# Patient Record
Sex: Male | Born: 1970 | Race: White | Hispanic: No | Smoking: Never smoker
Health system: Southern US, Community
[De-identification: ages and names within clinical notes are randomized; demographics above are authoritative.]

## PROBLEM LIST (undated history)

## (undated) DIAGNOSIS — I639 Cerebral infarction, unspecified: Secondary | ICD-10-CM

## (undated) DIAGNOSIS — I10 Essential (primary) hypertension: Secondary | ICD-10-CM

## (undated) DIAGNOSIS — E785 Hyperlipidemia, unspecified: Secondary | ICD-10-CM

---

## 2003-07-16 DIAGNOSIS — I639 Cerebral infarction, unspecified: Secondary | ICD-10-CM

## 2003-07-16 HISTORY — DX: Cerebral infarction, unspecified: I63.9

## 2021-01-12 HISTORY — PX: SPINAL FIXATION SURGERY: SHX1055

## 2021-04-18 ENCOUNTER — Encounter: Payer: Self-pay | Admitting: Physical Therapy

## 2021-04-23 ENCOUNTER — Other Ambulatory Visit: Payer: Self-pay | Admitting: Physical Medicine and Rehabilitation

## 2021-04-23 ENCOUNTER — Other Ambulatory Visit: Payer: Self-pay

## 2021-04-23 ENCOUNTER — Encounter (HOSPITAL_COMMUNITY): Payer: Self-pay | Admitting: Physical Medicine and Rehabilitation

## 2021-04-23 ENCOUNTER — Inpatient Hospital Stay (HOSPITAL_COMMUNITY)
Admission: RE | Admit: 2021-04-23 | Discharge: 2021-05-25 | DRG: 945 | Disposition: A | Discharge status: Home Health | Payer: 59 | Source: Other Acute Inpatient Hospital | Attending: Physical Medicine and Rehabilitation | Admitting: Physical Medicine and Rehabilitation

## 2021-04-23 ENCOUNTER — Inpatient Hospital Stay (HOSPITAL_COMMUNITY): Payer: 59

## 2021-04-23 DIAGNOSIS — S14104S Unspecified injury at C4 level of cervical spinal cord, sequela: Secondary | ICD-10-CM | POA: Diagnosis not present

## 2021-04-23 DIAGNOSIS — J9611 Chronic respiratory failure with hypoxia: Secondary | ICD-10-CM | POA: Diagnosis present

## 2021-04-23 DIAGNOSIS — S14105D Unspecified injury at C5 level of cervical spinal cord, subsequent encounter: Principal | ICD-10-CM

## 2021-04-23 DIAGNOSIS — Z09 Encounter for follow-up examination after completed treatment for conditions other than malignant neoplasm: Secondary | ICD-10-CM

## 2021-04-23 DIAGNOSIS — Z79899 Other long term (current) drug therapy: Secondary | ICD-10-CM | POA: Diagnosis not present

## 2021-04-23 DIAGNOSIS — Z7902 Long term (current) use of antithrombotics/antiplatelets: Secondary | ICD-10-CM | POA: Diagnosis not present

## 2021-04-23 DIAGNOSIS — Z931 Gastrostomy status: Secondary | ICD-10-CM

## 2021-04-23 DIAGNOSIS — N319 Neuromuscular dysfunction of bladder, unspecified: Secondary | ICD-10-CM | POA: Diagnosis present

## 2021-04-23 DIAGNOSIS — L89152 Pressure ulcer of sacral region, stage 2: Secondary | ICD-10-CM | POA: Diagnosis present

## 2021-04-23 DIAGNOSIS — Z930 Tracheostomy status: Secondary | ICD-10-CM | POA: Diagnosis not present

## 2021-04-23 DIAGNOSIS — L8990 Pressure ulcer of unspecified site, unspecified stage: Secondary | ICD-10-CM | POA: Insufficient documentation

## 2021-04-23 DIAGNOSIS — S14114D Complete lesion at C4 level of cervical spinal cord, subsequent encounter: Principal | ICD-10-CM

## 2021-04-23 DIAGNOSIS — Z419 Encounter for procedure for purposes other than remedying health state, unspecified: Secondary | ICD-10-CM

## 2021-04-23 DIAGNOSIS — Y95 Nosocomial condition: Secondary | ICD-10-CM | POA: Diagnosis present

## 2021-04-23 DIAGNOSIS — R1310 Dysphagia, unspecified: Secondary | ICD-10-CM | POA: Diagnosis present

## 2021-04-23 DIAGNOSIS — E46 Unspecified protein-calorie malnutrition: Secondary | ICD-10-CM | POA: Diagnosis present

## 2021-04-23 DIAGNOSIS — I69351 Hemiplegia and hemiparesis following cerebral infarction affecting right dominant side: Secondary | ICD-10-CM

## 2021-04-23 DIAGNOSIS — Z809 Family history of malignant neoplasm, unspecified: Secondary | ICD-10-CM

## 2021-04-23 DIAGNOSIS — G629 Polyneuropathy, unspecified: Secondary | ICD-10-CM

## 2021-04-23 DIAGNOSIS — M24521 Contracture, right elbow: Secondary | ICD-10-CM | POA: Diagnosis present

## 2021-04-23 DIAGNOSIS — M24529 Contracture, unspecified elbow: Secondary | ICD-10-CM

## 2021-04-23 DIAGNOSIS — S12400D Unspecified displaced fracture of fifth cervical vertebra, subsequent encounter for fracture with routine healing: Secondary | ICD-10-CM | POA: Diagnosis not present

## 2021-04-23 DIAGNOSIS — R059 Cough, unspecified: Secondary | ICD-10-CM

## 2021-04-23 DIAGNOSIS — M24522 Contracture, left elbow: Secondary | ICD-10-CM | POA: Diagnosis present

## 2021-04-23 DIAGNOSIS — M62422 Contracture of muscle, left upper arm: Secondary | ICD-10-CM | POA: Diagnosis present

## 2021-04-23 DIAGNOSIS — G894 Chronic pain syndrome: Secondary | ICD-10-CM | POA: Diagnosis present

## 2021-04-23 DIAGNOSIS — K592 Neurogenic bowel, not elsewhere classified: Secondary | ICD-10-CM | POA: Diagnosis present

## 2021-04-23 DIAGNOSIS — J156 Pneumonia due to other aerobic Gram-negative bacteria: Secondary | ICD-10-CM | POA: Diagnosis present

## 2021-04-23 DIAGNOSIS — G8250 Quadriplegia, unspecified: Secondary | ICD-10-CM | POA: Diagnosis present

## 2021-04-23 DIAGNOSIS — M62421 Contracture of muscle, right upper arm: Secondary | ICD-10-CM | POA: Diagnosis present

## 2021-04-23 DIAGNOSIS — M62838 Other muscle spasm: Secondary | ICD-10-CM | POA: Diagnosis present

## 2021-04-23 DIAGNOSIS — Z6827 Body mass index (BMI) 27.0-27.9, adult: Secondary | ICD-10-CM

## 2021-04-23 DIAGNOSIS — Z430 Encounter for attention to tracheostomy: Secondary | ICD-10-CM

## 2021-04-23 DIAGNOSIS — R609 Edema, unspecified: Secondary | ICD-10-CM | POA: Diagnosis not present

## 2021-04-23 DIAGNOSIS — D72829 Elevated white blood cell count, unspecified: Secondary | ICD-10-CM

## 2021-04-23 DIAGNOSIS — I10 Essential (primary) hypertension: Secondary | ICD-10-CM | POA: Diagnosis present

## 2021-04-23 DIAGNOSIS — L89150 Pressure ulcer of sacral region, unstageable: Secondary | ICD-10-CM | POA: Diagnosis present

## 2021-04-23 DIAGNOSIS — E8809 Other disorders of plasma-protein metabolism, not elsewhere classified: Secondary | ICD-10-CM | POA: Diagnosis not present

## 2021-04-23 DIAGNOSIS — L89320 Pressure ulcer of left buttock, unstageable: Secondary | ICD-10-CM | POA: Diagnosis present

## 2021-04-23 DIAGNOSIS — E876 Hypokalemia: Secondary | ICD-10-CM | POA: Diagnosis not present

## 2021-04-23 DIAGNOSIS — D62 Acute posthemorrhagic anemia: Secondary | ICD-10-CM | POA: Diagnosis present

## 2021-04-23 DIAGNOSIS — J189 Pneumonia, unspecified organism: Secondary | ICD-10-CM | POA: Diagnosis not present

## 2021-04-23 DIAGNOSIS — Z8249 Family history of ischemic heart disease and other diseases of the circulatory system: Secondary | ICD-10-CM | POA: Diagnosis not present

## 2021-04-23 DIAGNOSIS — R338 Other retention of urine: Secondary | ICD-10-CM | POA: Diagnosis present

## 2021-04-23 DIAGNOSIS — G4700 Insomnia, unspecified: Secondary | ICD-10-CM | POA: Diagnosis not present

## 2021-04-23 HISTORY — DX: Cerebral infarction, unspecified: I63.9

## 2021-04-23 MED ORDER — BACLOFEN 10 MG PO TABS
40.0000 mg | ORAL_TABLET | Freq: Three times a day (TID) | ORAL | Status: DC
  Administered 2021-04-23 – 2021-05-25 (×94): 40 mg via ORAL
  Filled 2021-04-23 (×96): qty 4

## 2021-04-23 MED ORDER — PSEUDOEPHEDRINE HCL 30 MG PO TABS
15.0000 mg | ORAL_TABLET | Freq: Two times a day (BID) | ORAL | Status: DC
  Administered 2021-04-23 – 2021-05-25 (×64): 15 mg via ORAL
  Filled 2021-04-23 (×65): qty 1

## 2021-04-23 MED ORDER — BISACODYL 10 MG RE SUPP
10.0000 mg | Freq: Every day | RECTAL | Status: DC
  Administered 2021-04-23 – 2021-05-24 (×29): 10 mg via RECTAL
  Filled 2021-04-23 (×33): qty 1

## 2021-04-23 MED ORDER — POLYETHYLENE GLYCOL 3350 17 G PO PACK: 17.0000 g | PACK | Freq: Every day | ORAL | Status: DC | PRN

## 2021-04-23 MED ORDER — ALUM & MAG HYDROXIDE-SIMETH 200-200-20 MG/5ML PO SUSP
30.0000 mL | ORAL | Status: DC | PRN
Start: 2021-04-23 — End: 2021-05-25

## 2021-04-23 MED ORDER — LISINOPRIL 5 MG PO TABS
5.0000 mg | ORAL_TABLET | Freq: Every day | ORAL | Status: DC
  Administered 2021-04-24 – 2021-05-25 (×28): 5 mg via ORAL
  Filled 2021-04-23 (×31): qty 1

## 2021-04-23 MED ORDER — BISACODYL 10 MG RE SUPP: 10.0000 mg | Freq: Every day | RECTAL | Status: DC | PRN

## 2021-04-23 MED ORDER — FLEET ENEMA 7-19 GM/118ML RE ENEM: 1.0000 | ENEMA | Freq: Once | RECTAL | Status: DC | PRN

## 2021-04-23 MED ORDER — ATORVASTATIN CALCIUM 40 MG PO TABS: 40.0000 mg | ORAL_TABLET | Freq: Every day | ORAL | Status: DC

## 2021-04-23 MED ORDER — BACLOFEN 1 MG/ML ORAL SUSPENSION: 40.0000 mg | Freq: Three times a day (TID) | ORAL | Status: DC

## 2021-04-23 MED ORDER — ATORVASTATIN CALCIUM 40 MG PO TABS
40.0000 mg | ORAL_TABLET | Freq: Every day | ORAL | Status: DC
  Administered 2021-04-23 – 2021-05-24 (×32): 40 mg via ORAL
  Filled 2021-04-23 (×33): qty 1

## 2021-04-23 MED ORDER — DIPHENHYDRAMINE HCL 12.5 MG/5ML PO ELIX: 12.5000 mg | ORAL_SOLUTION | Freq: Four times a day (QID) | ORAL | Status: DC | PRN

## 2021-04-23 MED ORDER — SENNOSIDES-DOCUSATE SODIUM 8.6-50 MG PO TABS
2.0000 | ORAL_TABLET | Freq: Every day | ORAL | Status: DC
  Administered 2021-04-24 – 2021-04-26 (×3): 2 via ORAL
  Filled 2021-04-23 (×3): qty 2

## 2021-04-23 MED ORDER — PSEUDOEPHEDRINE HCL 15 MG/5ML PO LIQD: 15.0000 mg | Freq: Two times a day (BID) | ORAL | Status: DC

## 2021-04-23 MED ORDER — ENOXAPARIN SODIUM 30 MG/0.3ML IJ SOSY
30.0000 mg | PREFILLED_SYRINGE | Freq: Two times a day (BID) | INTRAMUSCULAR | Status: AC
Start: 2021-04-23 — End: 2021-05-06
  Administered 2021-04-23 – 2021-05-06 (×27): 30 mg via SUBCUTANEOUS
  Filled 2021-04-23 (×27): qty 0.3

## 2021-04-23 MED ORDER — LISINOPRIL 5 MG PO TABS: 5.0000 mg | ORAL_TABLET | Freq: Every day | ORAL | Status: DC

## 2021-04-23 MED ORDER — MAGIC MOUTHWASH W/LIDOCAINE: 5.0000 mL | Freq: Four times a day (QID) | ORAL | Status: DC | PRN

## 2021-04-23 MED ORDER — ZINC SULFATE 220 (50 ZN) MG PO CAPS
220.0000 mg | ORAL_CAPSULE | Freq: Every day | ORAL | Status: DC
  Administered 2021-04-23 – 2021-05-25 (×32): 220 mg via ORAL
  Filled 2021-04-23 (×32): qty 1

## 2021-04-23 MED ORDER — TRAZODONE HCL 50 MG PO TABS: 75.0000 mg | ORAL_TABLET | Freq: Every day | ORAL | Status: DC

## 2021-04-23 MED ORDER — MELATONIN 3 MG PO TABS
6.0000 mg | ORAL_TABLET | Freq: Every day | ORAL | Status: DC
  Administered 2021-04-23 – 2021-05-24 (×31): 6 mg
  Filled 2021-04-23 (×31): qty 2

## 2021-04-23 MED ORDER — MUSCLE RUB 10-15 % EX CREA
TOPICAL_CREAM | CUTANEOUS | Status: DC | PRN
  Filled 2021-04-23: qty 85

## 2021-04-23 MED ORDER — PROCHLORPERAZINE MALEATE 5 MG PO TABS: 5.0000 mg | ORAL_TABLET | Freq: Four times a day (QID) | ORAL | Status: DC | PRN

## 2021-04-23 MED ORDER — SENNOSIDES 8.8 MG/5ML PO SYRP: 10.0000 mL | ORAL_SOLUTION | Freq: Every day | ORAL | Status: DC

## 2021-04-23 MED ORDER — PROCHLORPERAZINE 25 MG RE SUPP: 12.5000 mg | Freq: Four times a day (QID) | RECTAL | Status: DC | PRN

## 2021-04-23 MED ORDER — OXYCODONE HCL 5 MG PO TABS: 5.0000 mg | ORAL_TABLET | ORAL | Status: DC | PRN

## 2021-04-23 MED ORDER — TRAZODONE HCL 50 MG PO TABS
75.0000 mg | ORAL_TABLET | Freq: Every day | ORAL | Status: DC
  Administered 2021-04-23 – 2021-04-29 (×7): 75 mg via ORAL
  Filled 2021-04-23 (×7): qty 2

## 2021-04-23 MED ORDER — LIDOCAINE 5 % EX PTCH
1.0000 | MEDICATED_PATCH | CUTANEOUS | Status: DC
  Administered 2021-04-23 – 2021-05-24 (×32): 1 via TRANSDERMAL
  Filled 2021-04-23 (×32): qty 1

## 2021-04-23 MED ORDER — POLYETHYLENE GLYCOL 3350 17 G PO PACK
17.0000 g | PACK | Freq: Every day | ORAL | Status: DC
  Administered 2021-04-24 – 2021-04-25 (×2): 17 g via ORAL
  Filled 2021-04-23 (×2): qty 1

## 2021-04-23 MED ORDER — ASCORBIC ACID 500 MG PO TABS
500.0000 mg | ORAL_TABLET | Freq: Two times a day (BID) | ORAL | Status: DC
  Administered 2021-04-23 – 2021-05-25 (×64): 500 mg via ORAL
  Filled 2021-04-23 (×64): qty 1

## 2021-04-23 MED ORDER — ALBUTEROL SULFATE (2.5 MG/3ML) 0.083% IN NEBU
2.5000 mg | INHALATION_SOLUTION | RESPIRATORY_TRACT | Status: DC | PRN
  Administered 2021-04-30 – 2021-05-02 (×6): 2.5 mg via RESPIRATORY_TRACT
  Filled 2021-04-23 (×6): qty 3

## 2021-04-23 MED ORDER — TRAZODONE HCL 50 MG PO TABS: 25.0000 mg | ORAL_TABLET | Freq: Every evening | ORAL | Status: DC | PRN

## 2021-04-23 MED ORDER — POLYETHYLENE GLYCOL 3350 17 G PO PACK: 17.0000 g | PACK | Freq: Every day | ORAL | Status: DC

## 2021-04-23 MED ORDER — JUVEN PO PACK
1.0000 | PACK | Freq: Two times a day (BID) | ORAL | Status: DC
  Administered 2021-04-24 – 2021-05-10 (×4): 1 via ORAL
  Filled 2021-04-23 (×27): qty 1

## 2021-04-23 MED ORDER — LIDOCAINE HCL URETHRAL/MUCOSAL 2 % EX GEL: CUTANEOUS | Status: DC | PRN

## 2021-04-23 MED ORDER — POLYVINYL ALCOHOL 1.4 % OP SOLN
1.0000 [drp] | OPHTHALMIC | Status: DC | PRN
  Administered 2021-04-29 – 2021-05-16 (×2): 1 [drp] via OPHTHALMIC
  Filled 2021-04-23 (×2): qty 15

## 2021-04-23 MED ORDER — PROCHLORPERAZINE EDISYLATE 10 MG/2ML IJ SOLN: 5.0000 mg | Freq: Four times a day (QID) | INTRAMUSCULAR | Status: DC | PRN

## 2021-04-23 MED ORDER — TIZANIDINE HCL 2 MG PO TABS
2.0000 mg | ORAL_TABLET | Freq: Two times a day (BID) | ORAL | Status: DC
  Administered 2021-04-23 – 2021-05-25 (×64): 2 mg via ORAL
  Filled 2021-04-23 (×63): qty 1

## 2021-04-23 MED ORDER — GUAIFENESIN-DM 100-10 MG/5ML PO SYRP
5.0000 mL | ORAL_SOLUTION | Freq: Four times a day (QID) | ORAL | Status: DC | PRN
  Filled 2021-04-23: qty 10

## 2021-04-23 MED ORDER — COLLAGENASE 250 UNIT/GM EX OINT
TOPICAL_OINTMENT | Freq: Every day | CUTANEOUS | Status: DC
  Filled 2021-04-23: qty 30

## 2021-04-23 MED ORDER — GABAPENTIN 300 MG PO CAPS
900.0000 mg | ORAL_CAPSULE | Freq: Three times a day (TID) | ORAL | Status: DC
  Administered 2021-04-23 – 2021-05-25 (×95): 900 mg via ORAL
  Filled 2021-04-23 (×94): qty 3

## 2021-04-23 MED ORDER — OXYCODONE HCL 5 MG PO TABS
5.0000 mg | ORAL_TABLET | ORAL | Status: DC | PRN
  Administered 2021-04-23 – 2021-05-02 (×6): 5 mg via ORAL
  Filled 2021-04-23 (×7): qty 1

## 2021-04-23 MED ORDER — GABAPENTIN 250 MG/5ML PO SOLN: 900.0000 mg | Freq: Three times a day (TID) | ORAL | Status: DC

## 2021-04-23 MED ORDER — ACETAMINOPHEN 325 MG PO TABS
325.0000 mg | ORAL_TABLET | ORAL | Status: DC | PRN
  Administered 2021-05-04 – 2021-05-18 (×7): 650 mg via ORAL
  Filled 2021-04-23 (×8): qty 2
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
## 2021-04-23 NOTE — Progress Notes (Signed)
Patient via ambulance by EMS. Alert and oriented x 4. Quadriplegic. Bilateral Lower extremity 3+ edema. Tingling sensation to upper arms.  Awake, Trache patent and intact, arrived with a #6 cuffed [place].  Stage IV pressure ulcer to right buttock and stage II pressure ulcer to ischium.  Measurements documented  and dressing changed.  Will continue to monitor.

## 2021-04-23 NOTE — Progress Notes (Signed)
INPATIENT REHABILITATION ADMISSION NOTE   Arrival Method: EMS      Mental Orientation: A&O x4   Assessment: see flowsheet    Skin: Stage 2 and unstageable to buttocks    IV'S: n/a   Pain: none reported   Tubes and Drains: n/a   Safety Measures: soft touch call bell     Vital Signs: see flowsheet   Height and Weight: see flowsheet   Rehab Orientation: oriented to rehab, safety, bowel/bladder, medications   Family: arriving later    Notes: Patient ID: Luke Wright, male   DOB: 03-24-1971, 50 y.o.   MRN: [id number]  Met with patient upon arrival to CIR. Introduced myself and explained my role in his care. Assisted with skin check, trach care, and suction. PA and MD explained medications, rehab process, and what to expect while on CIR. Soft touch call bell in place, air mattress ordered, and all questions answered. RT has been in to assess trach and ordered necessary supplies for patient. Will continue to monitor progress.  Dorthula Nettles, RN3, BSN, CBIS, [place], [facility], Inpatient Rehabilitation Office [phone] Cell [phone]

## 2021-04-23 NOTE — Progress Notes (Signed)
Inpatient Rehabilitation Medication Review by a Pharmacist  A complete drug regimen review was completed for this patient to identify any potential clinically significant medication issues.  High Risk Drug Classes Is patient taking? Indication by Medication  Antipsychotic Yes Prochlorperazine: PRN nausea   Anticoagulant Yes Enoxaparin: DVT ppx  Antibiotic No   Opioid Yes Oxycodone: PRN pain  Antiplatelet No   Hypoglycemics/insulin No   Vasoactive Medication No   Chemotherapy No   Other Yes Acetaminophen: PRN pain Albuterol: PRN SOB Maalox: PRN indigestion Atorvastatin: HLD/CVA ppx Baclofen: muscle spasms Bisacodyl: PRN constipation Diphenhydramine: PRN itching Robitussin DM: PRN cough Lidocaine patch: pain Melatonin: insomnia PEG: constipation Pseudoephedrine: secretions, bradycardia Senokot-S: constipation Fleet enema: constipation  Tizanidine: muscle spasms Trazodone: PRN sleep       Type of Medication Issue Identified Description of Issue Recommendation(s)  Drug Interaction(s) (clinically significant)     Duplicate Therapy     Allergy     No Medication Administration End Date     Incorrect Dose     Additional Drug Therapy Needed     Significant med changes from prior encounter (inform family/care partners about these prior to discharge).    Other       Clinically significant medication issues were identified that warrant physician communication and completion of prescribed/recommended actions by midnight of the next day:  No  Pharmacist comments:  - medications reordered from [facility] IP medication list   Time spent performing this drug regimen review (minutes):  15 minutes   Thank you for allowing pharmacy to be a part of this patient's care.  Thelma Barge, PharmD Clinical Pharmacist
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
## 2021-04-24 ENCOUNTER — Encounter (HOSPITAL_COMMUNITY): Payer: 59

## 2021-04-24 DIAGNOSIS — J189 Pneumonia, unspecified organism: Secondary | ICD-10-CM

## 2021-04-24 LAB — CBC WITH DIFFERENTIAL/PLATELET
Abs Immature Granulocytes: 0.03 10*3/uL (ref 0.00–0.07)
Basophils Absolute: 0.1 10*3/uL (ref 0.0–0.1)
Basophils Relative: 1 %
Eosinophils Absolute: 0.3 10*3/uL (ref 0.0–0.5)
Eosinophils Relative: 3 %
HCT: 30.5 % — ABNORMAL LOW (ref 39.0–52.0)
Hemoglobin: 9.8 g/dL — ABNORMAL LOW (ref 13.0–17.0)
Immature Granulocytes: 0 %
Lymphocytes Relative: 35 %
Lymphs Abs: 2.8 10*3/uL (ref 0.7–4.0)
MCH: 27.2 pg (ref 26.0–34.0)
MCHC: 32.1 g/dL (ref 30.0–36.0)
MCV: 84.7 fL (ref 80.0–100.0)
Monocytes Absolute: 1 10*3/uL (ref 0.1–1.0)
Monocytes Relative: 12 %
Neutro Abs: 4 10*3/uL (ref 1.7–7.7)
Neutrophils Relative %: 49 %
Platelets: 274 10*3/uL (ref 150–400)
RBC: 3.6 MIL/uL — ABNORMAL LOW (ref 4.22–5.81)
RDW: 16.3 % — ABNORMAL HIGH (ref 11.5–15.5)
WBC: 8.2 10*3/uL (ref 4.0–10.5)
nRBC: 0 % (ref 0.0–0.2)

## 2021-04-24 LAB — COMPREHENSIVE METABOLIC PANEL
ALT: 10 U/L (ref 0–44)
AST: 12 U/L — ABNORMAL LOW (ref 15–41)
Albumin: 2.6 g/dL — ABNORMAL LOW (ref 3.5–5.0)
Alkaline Phosphatase: 85 U/L (ref 38–126)
Anion gap: 7 (ref 5–15)
BUN: 9 mg/dL (ref 6–20)
CO2: 27 mmol/L (ref 22–32)
Calcium: 8.8 mg/dL — ABNORMAL LOW (ref 8.9–10.3)
Chloride: 102 mmol/L (ref 98–111)
Creatinine, Ser: 0.61 mg/dL (ref 0.61–1.24)
GFR, Estimated: >60 mL/min (ref >60)
Glucose, Bld: 112 mg/dL — ABNORMAL HIGH (ref 70–99)
Potassium: 3.7 mmol/L (ref 3.5–5.1)
Sodium: 136 mmol/L (ref 135–145)
Total Bilirubin: 0.4 mg/dL (ref 0.3–1.2)
Total Protein: 5.9 g/dL — ABNORMAL LOW (ref 6.5–8.1)

## 2021-04-24 MED ORDER — SODIUM CHLORIDE 3 % IN NEBU
4.0000 mL | INHALATION_SOLUTION | Freq: Four times a day (QID) | RESPIRATORY_TRACT | Status: DC | PRN
  Administered 2021-04-24: 4 mL via RESPIRATORY_TRACT
  Filled 2021-04-24 (×2): qty 4

## 2021-04-24 MED ORDER — SODIUM CHLORIDE 0.9 % IV SOLN
2.0000 g | Freq: Three times a day (TID) | INTRAVENOUS | Status: DC
  Administered 2021-04-24 – 2021-04-26 (×6): 2 g via INTRAVENOUS
  Filled 2021-04-24 (×7): qty 2

## 2021-04-24 NOTE — Patient Care Conference (Signed)
Inpatient RehabilitationTeam Conference and Plan of Care Update Date: 04/24/2021   Time: 11:24 AM    Patient Name: Luke Wright      Medical Record Number: [id number]  Date of Birth: 08/28/1970 Sex: Male         Room/Bed: 4W20C/4W20C-01 Payor Info: Payor: Advertising copywriter MEDICARE / Plan: Suan Halter DUAL COMPLETE / Product Type: *No Product type* /    Admit Date/Time:  04/23/2021  2:17 PM  Primary Diagnosis:  Quadriplegia, unspecified [facility])  Hospital Problems: Principal Problem:   Quadriplegia, unspecified (HCC) Active Problems:   Pressure injury of skin   Pressure injury of sacral region, unstageable [facility])    Expected Discharge Date: Expected Discharge Date:  (4 Weeks)  Team Members Present: Physician leading conference: Dr. Genice Rouge Social Worker Present: [name], LCSWA Nurse Present: Kennyth Arnold, RN PT Present: Peter Congo, PT OT Present: Ardis Rowan, COTA;Jennifer Katrinka Blazing, OT SLP Present: Feliberto Gottron, SLP PPS Coordinator present : Fae Pippin, SLP     Current Status/Progress Goal Weekly Team Focus  Bowel/Bladder   incont times two. patient requiring I&O cath  family members will learn how to cath patient  assess q shift and prn   Swallow/Nutrition/ Hydration   Eval Pending         ADL's   total/dependent bathing, dressing. total A for feeding/oral hygiene, etc. Has movement at shoulders, bicep, and trace in R wrist. Very painful UEs. R elbow contracture > L elbow contracture.  Max/total bathe/dress, Mod self feed/oral hygiene with AE  bed mobility, AE for self feeding/oral hygiene, directing care, core strength/sitting balance   Mobility   PT Eval Pending  PT Eval Pending  PT Eval Pending   Communication   Eval Pending         Safety/Cognition/ Behavioral Observations  Eval Pending         Pain   0 out of 10  remain pain free  assess q shift and prn   Skin   2 wounds on bottom, foam dressing on them. trach  have patient  rotate sides every 2 hours  assess q 2 hours     Discharge Planning:  To be assessed.   Team Discussion: Wrist extension, bilateral elbow contractures. Copious amounts of thick secretions from trach. To do Botox on arms Thursday. Beginning bowel/bladder program. No reported pain, but severe spasticity in arms/legs. Unstageable to left buttock with stage 2 next to it. Consulting WOC concerning Hydrotherapy. Lots of tone, has splints for elbows. Needs to be put on schedule for Dr. Kieth Brightly. Will need Baclofen pump post discharge. Trach now at 35% FIO2. Soft touch call bell place at head.  Patient on target to meet rehab goals: Total assist +2 for rolling. Doesn't tolerate EOB very long. OT and SLP eval pending.  *See Care Plan and progress notes for long and short-term goals.   Revisions to Treatment Plan:  Adjusting medications, consulting WOC, monitoring daily labs.  Teaching Needs: Family education, medication management, pain, tone, spasticity management, trach care, skin/wound care, bowel/bladder management, transfer training, balance training, endurance training, W/C training, safety awareness.  Current Barriers to Discharge: Inaccessible home environment, Decreased caregiver support, Medical stability, Home enviroment access/layout, Trach, Incontinence, Neurogenic bowel and bladder, Wound care, Lack of/limited family support, Weight, Weight bearing restrictions, Medication compliance, Behavior, Nutritional means, and New oxygen  Possible Resolutions to Barriers: Family education with daughters, continue current medications, monitor lab work, monitor depression.     Medical Summary Current Status: developing contractures in B/L  elbows- on bowel program; severe spasticity in arms/legs- needs Botox and ITB pump eventually;; unstageable to L buttock and stage II next to it;  copious thick secretions with trach- no PMV; hypotension;  Barriers to Discharge: Behavior;Decreased  family/caregiver support;Home enviroment access/layout;Incontinence;Wound care;New oxygen;Trach;Weight;Medical stability;Neurogenic Bowel & Bladder;Other (comments)  Barriers to Discharge Comments: SW- to be assessed; severe spasticity- needs Botox Possible Resolutions to [employer] Focus: BP good with TEDs; sats dropped to low 80s with PT; OT not eval'd yey; SLP this afternoon- D/c ~ 4 weeks   Continued Need for Acute Rehabilitation Level of Care: The patient requires daily medical management by a physician with specialized training in physical medicine and rehabilitation for the following reasons: Direction of a multidisciplinary physical rehabilitation program to maximize functional independence : Yes Medical management of patient stability for increased activity during participation in an intensive rehabilitation regime.: Yes Analysis of laboratory values and/or radiology reports with any subsequent need for medication adjustment and/or medical intervention. : Yes   I attest that I was present, lead the team conference, and concur with the assessment and plan of the team.   Tennis Must 04/24/2021, 6:05 PM
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
## 2021-04-24 NOTE — Plan of Care (Signed)
  Problem: RH Balance Goal: LTG: Patient will maintain dynamic sitting balance (OT) Description: LTG:  Patient will maintain dynamic sitting balance with assistance during activities of daily living (OT) Flowsheets (Taken 04/24/2021 1711) LTG: Pt will maintain dynamic sitting balance during ADLs with: Moderate Assistance - Patient 50 - 74%   Problem: RH Eating Goal: LTG Patient will perform eating w/assist, cues/equip (OT) Description: LTG: Patient will perform eating with assist, with/without cues using equipment (OT) Flowsheets (Taken 04/24/2021 1711) LTG: Pt will perform eating with assistance level of: Moderate Assistance - Patient 50 - 74%   Problem: RH Grooming Goal: LTG Patient will perform grooming w/assist,cues/equip (OT) Description: LTG: Patient will perform grooming with assist, with/without cues using equipment (OT) Flowsheets (Taken 04/24/2021 1711) LTG: Pt will perform grooming with assistance level of: Maximal Assistance - Patient 25 - 49%   Problem: RH Bathing Goal: LTG Patient will bathe all body parts with assist levels (OT) Description: LTG: Patient will bathe all body parts with assist levels (OT) Flowsheets (Taken 04/24/2021 1711) LTG: Pt will perform bathing with assistance level/cueing: Maximal Assistance - Patient 25 - 49%   Problem: RH Dressing Goal: LTG Patient will perform upper body dressing (OT) Description: LTG Patient will perform upper body dressing with assist, with/without cues (OT). Flowsheets (Taken 04/24/2021 1711) LTG: Pt will perform upper body dressing with assistance level of: Maximal Assistance - Patient 25 - 49% Goal: LTG Patient will perform lower body dressing w/assist (OT) Description: LTG: Patient will perform lower body dressing with assist, with/without cues in positioning using equipment (OT) Flowsheets (Taken 04/24/2021 1711) LTG: Pt will perform lower body dressing with assistance level of: Maximal Assistance - Patient 25 - 49%    Problem: RH Functional Use of Upper Extremity Goal: LTG Patient will use RT/LT upper extremity as a (OT) Description: LTG: Patient will use right/left upper extremity as a stabilizer/gross assist/diminished/nondominant/dominant level with assist, with/without cues during functional activity (OT) Flowsheets (Taken 04/24/2021 1711) LTG: Use of upper extremity in functional activities: RUE as dominant level LTG: Pt will use upper extremity in functional activity with assistance level of: Moderate Assistance - Patient 50 - 74%   Problem: RH Pre-functional/Other (Specify) Goal: RH LTG OT (Specify) 1 Description: RH LTG OT (Specify) 1 Flowsheets (Taken 04/24/2021 1711) LTG: Other OT (Specify) 1: Pt will direct caregivers in bowel/bladder program as well as hygiene and clothing management with Independence
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
## 2021-04-24 NOTE — Plan of Care (Signed)
  Problem: RH Wheelchair Mobility Goal: LTG Patient will propel w/c in home environment (PT) Description: LTG: Patient will propel wheelchair in home environment, # of feet with assistance (PT). Flowsheets (Taken 04/24/2021 1329) LTG: Pt will propel w/c in home environ  assist needed:: Independent with assistive device LTG: Propel w/c distance in home environment: 75 ft

## 2021-04-24 NOTE — Progress Notes (Signed)
Pharmacy Antibiotic Note  Luke Wright is a 50 y.o. male admitted on 04/23/2021 with suspected pneumonia.  Patient is reported to have copious green secretions out of his tracheostomy tube Chest x-ray independently reviewed shows left lower lobe consolidation. Pharmacy has been consulted for cefepime dosing.  Plan: Cefepime 2g IV q8h  F/U tracheal aspirate cultures  Height: 5' 10.87" (180 cm) Weight: 111 kg (244 lb 11.4 oz) IBW/kg (Calculated) : 75  Temp (24hrs), Avg:98 F (36.7 C), Min:97.8 F (36.6 C), Max:98.2 F (36.8 C)  Recent Labs  Lab 04/24/21 0515  WBC 8.2  CREATININE 0.61    Estimated Creatinine Clearance: 139.7 mL/min (by C-G formula based on SCr of 0.61 mg/dL).    No Known Allergies  Antimicrobials this admission: none  Microbiology results: 10/11 trach aspirate cx: pending   Thank you for allowing pharmacy to be a part of this patient's care.  Thelma Barge, PharmD Clinical Pharmacist

## 2021-04-24 NOTE — Progress Notes (Signed)
Patient appears stable this shift.  Started on IV ABT with no adverse reaction noted.  Copious green color secretions noted from trach with multiple deep suctioning.  No SOB noted.

## 2021-04-24 NOTE — Progress Notes (Signed)
Inpatient Rehabilitation  Patient information reviewed and entered into eRehab system by Satish Hammers Chelcee Korpi, OTR/L.   Information including medical coding, functional ability and quality indicators will be reviewed and updated through discharge.    

## 2021-04-24 NOTE — Progress Notes (Addendum)
Patient ID: Luke Wright, male   DOB: 08-13-1970, 50 y.o.   MRN: [id number]  SW met with pt, pt dtr, and her boyfriend's father Lyman Speller to provide updates from team conference, and inform d/c dtr pending at this time. Dtr [name] reports she will be his primary caregiver since she is a stay at home mother. Reports he will not be returning back to his home, d/c to daughter's home: [address] [place], [place] [address]. SW will continue to provide updates and assess pt for d/c needs.   Loralee Pacas, MSW, [place] Office: [phone] Cell: [phone] Fax: [phone]
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
## 2021-04-24 NOTE — Progress Notes (Signed)
Physical Therapy Session Note  Patient Details  Name: Luke Wright MRN: [id number] Date of Birth: 08/04/1970  Today's Date: 04/24/2021 PT Individual Time: 1630-1700 PT Individual Time Calculation (min): 30 min   Short Term Goals: Week 1:  PT Short Term Goal 1 (Week 1): Pt will tolerate sitting up in chair x 1 hour PT Short Term Goal 2 (Week 1): Pt will initiate power wheelchair mobility PT Short Term Goal 3 (Week 1): Pt will recall pressure relief schedule and pressure relief techniques  Skilled Therapeutic Interventions/Progress Updates:    Pt received seated in bed, agreeable to PT session. No complaints of pain. Pt utilized PSMV this session with no decrease in SpO2, on 7.5L O2 via trach collar and SpO2 remains at 90% and higher. Pt reports pain in UE when rolling with nursing due to arm pressing against bedrail, requesting bedrails be lowered during bed mobility. Attempted rolling with bedrails down, pt unsafe to perform this without bedrails at this time. Placed pillow between UE and bedrail and pt has improved comfort with rolling. Pt is total A x 2 for rolling R/L, able to minimally pull with therapist UE looped through his UE. Placed sign behind pt's bed to remind staff to place pillow between UE and bedrail before rolling. Discussed pressure relief in bed and importance of time spent in SL vs supine due to sacral and buttocks wounds. Pt agreeable to remain in R sidelying at end of session. Also educated pt that next session will focus on transfers to [facility] chair with eventual progression to power wheelchair, pt excited for power wheelchair. Pt left in R sidelying in bed with soft touch call button next to his head in pillow, bed alarm in place.  Therapy Documentation Precautions:  Precautions Precautions: Fall Precaution Comments: C5 SCI, [place] A, trach Restrictions Weight Bearing Restrictions: No    Therapy/Group: Individual Therapy   Peter Congo, PT, DPT,  CSRS  04/24/2021, 5:46 PM
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
## 2021-04-25 ENCOUNTER — Inpatient Hospital Stay (HOSPITAL_COMMUNITY): Payer: 59

## 2021-04-25 DIAGNOSIS — R609 Edema, unspecified: Secondary | ICD-10-CM

## 2021-04-25 MED ORDER — DAKINS (1/4 STRENGTH) 0.125 % EX SOLN
Freq: Every day | CUTANEOUS | Status: AC
  Filled 2021-04-25: qty 473

## 2021-04-25 MED ORDER — ONABOTULINUMTOXINA 100 UNITS IJ SOLR
400.0000 [IU] | Freq: Once | INTRAMUSCULAR | Status: DC
  Filled 2021-04-25: qty 400

## 2021-04-25 NOTE — Progress Notes (Signed)
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 He has never used smokeless tobacco. He reports that he does not drink alcohol and does not use drugs.  Family / Support Systems Marital Status: Divorced How Long?: 2005 Patient Roles: Parent Spouse/Significant Other: N/Luke Children: adult dtr- Luke Wright Other Supports: none Anticipated Caregiver: dtr Luke Wright Ability/Limitations of Caregiver: Luke Wright is Luke stay at home mother and cares for 6 children under 83 yrs old. Caregiver Availability: Intermittent Family Dynamics: Pt was living alone, and now will d/c to his daughter's home.  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 Education: high school grad Health Literacy - How often do you need to have someone help you when you read instructions, pamphlets, or other written material from your doctor or pharmacy?: Never Writes: Yes Employment Status: Disabled Date Retired/Disabled/Unemployed: SSDI began in 2012 Legal History/Current Legal Issues: [place] in 2006. No current issues. Guardian/Conservator: N/Luke   Abuse/Neglect Abuse/Neglect Assessment Can Be Completed: Yes Physical Abuse: Denies Verbal Abuse: Denies Sexual Abuse: Denies Exploitation of patient/patient's resources: Denies Self-Neglect:  Denies  Patient response to: Social Isolation - How often do you feel lonely or isolated from those around you?: Never  Emotional Status Pt's affect, behavior and adjustment status: Pt in good spirits at time of visit. Recent Psychosocial Issues: Denies Psychiatric History: Denies Substance Abuse History: Denies  Patient / Family Perceptions, Expectations & Goals Pt/Family understanding of illness & functional limitations: Pt and family have Luke general understanding of care needs. Premorbid pt/family roles/activities: Independent Anticipated changes in roles/activities/participation: Assistance with ADLs/IADLs Pt/family expectations/goals: Pt goal " walking, feet, and hands get back to myself." Daughter's goal: " same as he said.Marland Kitchenget all his mobility back like he was before."  [employer]: None Premorbid Home Care/DME Agencies: None Transportation available at discharge: Daughter Is the patient able to respond to transportation needs?: Yes In the past 12 months, has lack of transportation kept you from medical appointments or from getting medications?: No In the past 12 months, has lack of transportation kept you from meetings, work, or from getting things needed for daily living?: No Resource referrals recommended: Neuropsychology  Discharge Planning Living Arrangements: Children, Other relatives Support Systems: Children, Other relatives Type of Residence: Private residence Insurance Resources: [employer] (specify county), Multimedia programmer (specify) (UHC Medicare) Museum/gallery curator Resources: [employer] Financial Screen Referred: No Living Expenses: Medical laboratory scientific officer Management: Family, Patient Does the patient have any problems obtaining your medications?: No Home Management: Pt was managing all home care needs Patient/Family Preliminary Plans: Pt will d/c to daughter;s home Care Coordinator Barriers to Discharge: Decreased caregiver support, Lack of/limited family  support Care Coordinator Anticipated Follow Up Needs: HH/OP  Clinical Impression SW met with pt, pt dtr Luke Wright, and Luke Wright (boyfriend's father) to introduce self, explain role, and discuss discharge process. Pt is not Luke English as Luke second language teacher. HCPOA is his  dtr Luke Wright. No DME.  Luke Wright Luke Wright 04/25/2021, 2:01 PM
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
## 2021-04-25 NOTE — Progress Notes (Signed)
BLE venous duplex has been completed.   Results can be found under chart review under CV PROC. 04/25/2021 6:04 PM Tommie Dejoseph RVT, RDMS

## 2021-04-25 NOTE — Progress Notes (Signed)
Occupational Therapy Session Note  Patient Details  Name: Luke Wright MRN: [id number] Date of Birth: 1971-01-29  Today's Date: 04/25/2021 OT Individual Time: 1400-1430 OT Individual Time Calculation (min): 30 min    Short Term Goals: Week 1:  OT Short Term Goal 1 (Week 1): Pt will self feed with AE PRN with Max A OT Short Term Goal 2 (Week 1): Pt will brush teeth with AE with Max A OT Short Term Goal 3 (Week 1): Pt will roll L/R with 1 assist to decrease caregiver burden OT Short Term Goal 4 (Week 1): Pt will tolerate wearing B elbow splints for 2 hours with no redness/irritation  Skilled Therapeutic Interventions/Progress Updates:    Pt resting in TIS w/c upon arrival. PMSV placed and pt able to converse with OTA. Pt with slight lean to Rt which pt recognized and asked to be repositioned. Discussed d/c plans. Pt to move in with daughter. Per pt, daughter has purchased a w/c accessible van and ramps in home have been built. Pt able to shrug shoulders. Pt noted with BUE horizontal adduction/abduction with gravity eliminated. BUE PROM at elbow to approx 90*. Discussed daily plans to get him OOB to increase tolerance. Educated pt on benefits of sitting in w/c vs in bed. Pt verbalized understanding.Pt remained in TIS w/c with pillows placed for positioning under BUE/elbows. PMSV removed and placed in container.   Therapy Documentation Precautions:  Precautions Precautions: Fall Precaution Comments: C5 SCI, [place] A, trach Restrictions Weight Bearing Restrictions: No Pain: Pt reports Rt shoulder discomfort (unrated); repositioned   Therapy/Group: Individual Therapy  Rich Brave 04/25/2021, 2:46 PM

## 2021-04-25 NOTE — Progress Notes (Signed)
Physical Therapy Session Note  Patient Details  Name: Luke Wright MRN: [id number] Date of Birth: [birth year]  Today's Date: 04/25/2021 PT Individual Time: 1105-1205 PT Individual Time Calculation (min): 60 min   Short Term Goals: Week 1:  PT Short Term Goal 1 (Week 1): Pt will tolerate sitting up in chair x 1 hour PT Short Term Goal 2 (Week 1): Pt will initiate power wheelchair mobility PT Short Term Goal 3 (Week 1): Pt will recall pressure relief schedule and pressure relief techniques  Skilled Therapeutic Interventions/Progress Updates: Pt presented in bed agreeable to therapy. Pt agreeable to try OOB via Maxi Move. Pt's SpO2 >90% throughout session. Pt noted to have soiled brief. Performed rolling to L total A x 2 to doff brief and perform peri-care. Thigh High TED hose on BP checked 89/55 (67) HR 74. Ace bandages applied and maxi move pad placed total A x 2 for rolling L/R. BP assessed prior to lift. 116/65 (80) HR 78. Performed Maxi move transfer to TIS with increased time needed for line management. Extensive repositioning required as head rest needed to be repositioned. BP checked once in place 112/65 (78) HR 84. Discussed with pt trialing to sit in TIS for ~1hour and return to bed for next PT session with PT agreeable. Pt set up with soft touch button at cheek and ensured pt was able to activate when rotating head. Pt left in TIS with nsg notified of pt's disposition.      Therapy Documentation Precautions:  Precautions Precautions: Fall Precaution Comments: C5 SCI, [place] A, trach Restrictions Weight Bearing Restrictions: No General:   Vital Signs: Therapy Vitals Temp: 98 F (36.7 C) Pulse Rate: 97 Resp: 20 BP: (!) 145/84 Patient Position (if appropriate): Lying Oxygen Therapy SpO2: 97 % O2 Device: Tracheostomy Collar O2 Flow Rate (L/min): 8 L/min FiO2 (%): 35 % Pain: Pain Assessment Pain Scale: 0-10 Pain Score: 0-No pain Mobility:   Locomotion :     Trunk/Postural Assessment :    Balance:   Exercises:   Other Treatments:      Therapy/Group: Individual Therapy  Josaphine Shimamoto 04/25/2021, 4:14 PM

## 2021-04-25 NOTE — Progress Notes (Signed)
Speech Language Pathology Daily Session Note  Patient Details  Name: Luke Wright MRN: [id number] Date of Birth: [birth year]  Today's Date: 04/25/2021 SLP Individual Time: 1200-1225 SLP Individual Time Calculation (min): 25 min  Short Term Goals: Week 1: SLP Short Term Goal 1 (Week 1): Patient will consume current diet with Mod I for use of swallowing compensatory strategies. SLP Short Term Goal 2 (Week 1): Patient will demonstrate efficient mastication and complete oral clearance with trials of regular textures without overt s/s of aspiration over 2 sessions with Mod I prior to upgrade. SLP Short Term Goal 3 (Week 1): Patient will participate in ongoing cognitive assessment to assess higher-level cognitive tasks. SLP Short Term Goal 4 (Week 1): Patient will demonstrate 90% speech intelligibility at the sentence level with supervision verbal cus for use of swallowing compensatory strategies. SLP Short Term Goal 5 (Week 1): Patient will wear PMSV without O2 saturations dropping below 85% and without reports/signs of distress throughout an entirety of a session.  Skilled Therapeutic Interventions:   Patient seen for a second session at lunchtime for skilled ST session so SLP could observe him with regular texture solids. He consumed ham and cheese sandwich, soda, diced peaches with SLP feeding and did not exhibit any mastication delays and swallow initiation appeared to be timely. No changes in vocal quality and no overt s/s aspiration or penetration. SLP is recommending at least one more observation with regular texture solids before upgrading diet from Dys 3 solids. He continues to benefit from skilled SLP intervention to maximize speech, cognitive and swallow function prior to discharge.  Pain Pain Assessment Pain Scale: 0-10 Pain Score: 0-No pain  Therapy/Group: Individual Therapy  Angela Nevin, MA, CCC-SLP Speech Therapy

## 2021-04-25 NOTE — Progress Notes (Signed)
Physical Therapy Session Note  Patient Details  Name: Luke Wright MRN: [id number] Date of Birth: [birth year]  Today's Date: 04/25/2021 PT Individual Time: 1300-1400 + 1445-1530 PT Individual Time Calculation (min): 60 min + 45 min  Short Term Goals: Week 1:  PT Short Term Goal 1 (Week 1): Pt will tolerate sitting up in chair x 1 hour PT Short Term Goal 2 (Week 1): Pt will initiate power wheelchair mobility PT Short Term Goal 3 (Week 1): Pt will recall pressure relief schedule and pressure relief techniques  Skilled Therapeutic Interventions/Progress Updates:     1st session: Pt seen reclined in TIS w/c on arrival - asleep but awakens to voice - agreeable to PT tx. Reports bilateral arm pain, unrated. Provided PROM for pain management with treatment to tolerance. Retrieved an oxygen adapter for portable O2 tank to allow mobility outside of room. Pt on 7.5L wall oxygen via trach mask. Donned PMV and pt tolerated well with oxygen maintaining >90% throughout session. Wheeled around rehab gyms with totalA and completed PROM to BUE and BLE, to tolerance. Restricted BUE with significant contractures in the elbow, chest, and wrist. Returned to his room and remained in w/c for upcoming OT session, reconnected to wall O2. Soft call bell placed.  2nd session: Pt sleeping in TIS w/c at start of session and is agreeable to PT. Continues to c/o BUE pain, similar as to prior session - PROM and repositioning provided for pain management. Worked on gradually raising TIS w/c to upright, tolerated x2 adjustments with no symptoms of dizziness or lightheadedness. Noted outside of his knees to be pressing against the leg rest - due to lack of sensation, provided cushion with koban and washcloth to reduce risk of skin breakdown, pressure injury, and AD. Assisted back to his bed via hoyer lift transfer with +3 assist for safety, dependant level. Remained semi-reclined in bed with pillows supporting UE 's and LE's. BP  assessed reading 133/83. Call bell in place.   Therapy Documentation Precautions:  Precautions Precautions: Fall Precaution Comments: C5 SCI, [place] A, trach Restrictions Weight Bearing Restrictions: No General:    Therapy/Group: Individual Therapy  Handsome Anglin P Bella Brummet PT 04/25/2021, 7:48 AM

## 2021-04-25 NOTE — Progress Notes (Signed)
Speech Language Pathology Daily Session Note  Patient Details  Name: Luke Wright MRN: [id number] Date of Birth: 1971/04/29  Today's Date: 04/25/2021 SLP Individual Time: 0915-1000 SLP Individual Time Calculation (min): 45 min  Short Term Goals: Week 1: SLP Short Term Goal 1 (Week 1): Patient will consume current diet with Mod I for use of swallowing compensatory strategies. SLP Short Term Goal 2 (Week 1): Patient will demonstrate efficient mastication and complete oral clearance with trials of regular textures without overt s/s of aspiration over 2 sessions with Mod I prior to upgrade. SLP Short Term Goal 3 (Week 1): Patient will participate in ongoing cognitive assessment to assess higher-level cognitive tasks. SLP Short Term Goal 4 (Week 1): Patient will demonstrate 90% speech intelligibility at the sentence level with supervision verbal cus for use of swallowing compensatory strategies. SLP Short Term Goal 5 (Week 1): Patient will wear PMSV without O2 saturations dropping below 85% and without reports/signs of distress throughout an entirety of a session.  Skilled Therapeutic Interventions:   Patient seen for skilled ST session with focus on swallow and speech goals. SLP assisted NT with repositioning patient in bed so he could eat breakfast. He had been suctioned prior to SLP arriving and so after checking trach for secretions, SLP donned his PMV. Patient able to achieve voicing without difficulty however continues with audible congestion throughout session. He consumed 12 ounces thin liquids, canned peaches and approximately 70% of dys 3 french toast. No overt s/s aspiration or penetration however patient did exhibit two brief instances of inability to move secretions and inability to voice. He recovered both times but then reporting he might need to get suctioned again. He required two instances of repositioning as he will lean to right side, but he is aware of this and requested  assistance spontaneously. He was left in bed with all needs in place and SLP pressing his call button for RN to come to suction. He continues to benefit from skilled SLP intervention to maximize speech, swallow and cognitive function prior to discharge.  Pain Pain Assessment Pain Scale: 0-10 Pain Score: 0-No pain  Therapy/Group: Individual Therapy  Angela Nevin, MA, CCC-SLP Speech Therapy

## 2021-04-25 NOTE — Consult Note (Signed)
WOC Nurse Consult Note: Reason for Consult: Unstageable pressure injury to left gluteal.  Scarring to right gluteal of previous injury.  Coccyx nonintact, appears to be a reopened area Wound type: pressure injury, full thickness Pressure Injury POA: Yes Measurement: Left:  7 cm x 4.8 cm  Scarring to gluteal folds and sacrum:  16 cm x 11 cm  Coccyx:  2 cm x 1.5 cm x 0.2 cm  Wound bed: LEft:  100% devitalized tissue Coccyx:  red and moist Drainage (amount, consistency, odor) moderate serosanguinous  no odor Periwound:scarring Dressing procedure/placement/frequency:Cleanse sacral/gluteal wounds with NS and pat dry. Apply Dakins moist gauze to left gluteal.  Top with dry gauze and secure with silicone sacral foam.  (Coccyx wound with foam only) Will consult PT for hydrotherapy  Will not follow at this time.  Please re-consult if needed.  Maple Hudson MSN, RN, FNP-BC CWON Wound, Ostomy, Continence Nurse Pager [phone]

## 2021-04-25 NOTE — Progress Notes (Signed)
Vitals:   04/25/21 0604 04/25/21 0835  BP: 105/63   Pulse: 92 88  Resp: 19 20  Temp: 99.9 F (37.7 C)   SpO2: 95% 94%    Appears mildly improved today. Saturation 94% on 35% FiO2. Copious green secretions persist. On exam-alert, interactive, can mouth words, scattered crackles at both bases, no rhonchi, S1-S2 regular, no accessory muscle use.  Sputum culture shows rare gram-negative rods, few gram-positive cocci in pairs.  Impression -treating for HAP, given copious secretions and left lower lobe infiltrate Tracheostomy status  Recommend -continue cefepime and simplify based on respiratory culture -Hold off on downsizing until secretions decrease  PCCM to follow intermittently  Luke Gunia V. Vassie Loll MD [phone]

## 2021-04-26 LAB — CULTURE, RESPIRATORY W GRAM STAIN

## 2021-04-26 MED ORDER — SENNOSIDES-DOCUSATE SODIUM 8.6-50 MG PO TABS
2.0000 | ORAL_TABLET | Freq: Every day | ORAL | Status: DC
  Administered 2021-04-27 – 2021-05-25 (×29): 2 via ORAL
  Filled 2021-04-26 (×29): qty 2

## 2021-04-26 MED ORDER — SODIUM CHLORIDE 0.9 % IV SOLN
3.0000 g | Freq: Four times a day (QID) | INTRAVENOUS | Status: DC
  Administered 2021-04-26: 3 g via INTRAVENOUS
  Filled 2021-04-26 (×2): qty 8

## 2021-04-26 MED ORDER — SODIUM CHLORIDE 3 % IN NEBU
4.0000 mL | INHALATION_SOLUTION | Freq: Two times a day (BID) | RESPIRATORY_TRACT | Status: DC
  Administered 2021-04-26 – 2021-05-01 (×9): 4 mL via RESPIRATORY_TRACT
  Filled 2021-04-26 (×10): qty 4

## 2021-04-26 MED ORDER — SORBITOL 70 % SOLN
30.0000 mL | Freq: Once | Status: AC
  Administered 2021-04-26: 30 mL via ORAL
  Filled 2021-04-26: qty 30

## 2021-04-26 MED ORDER — SODIUM CHLORIDE 0.9 % IV SOLN
2.0000 g | Freq: Three times a day (TID) | INTRAVENOUS | Status: DC
  Administered 2021-04-26 – 2021-05-07 (×33): 2 g via INTRAVENOUS
  Filled 2021-04-26 (×36): qty 2

## 2021-04-26 MED ORDER — POLYETHYLENE GLYCOL 3350 17 G PO PACK
17.0000 g | PACK | Freq: Every day | ORAL | Status: DC
  Administered 2021-04-26 – 2021-05-17 (×7): 17 g via ORAL
  Filled 2021-04-26 (×26): qty 1
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
## 2021-04-26 NOTE — Progress Notes (Signed)
Bowel program started. Dig stim performed. No stool returned. Clear mucous noted. Suppository inserted. Pt placed on bedpan and call bell placed in reach. No complications noted. Handoff given to night nurse Mylo Red, LPN

## 2021-04-26 NOTE — Progress Notes (Signed)
Dig stem performed.  Unable to feel anything in rectal canal.  No stool returned.  Clear mucous noted.
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
## 2021-04-26 NOTE — Progress Notes (Signed)
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 Date of Onset:  (Unknown) Prior Treatments: Dressing changes; Santyl  Pain Score: Pain Score: 0-No pain  Objective  Cognition: WFL Communication: Impaired:  Mobility: Impaired:  ROM: Impaired:  Sensation: absent Protective sensation (10g): absent Signs of venous insufficiency:N/A Signs of arterial insufficiency: N/A  Wound Assessment  Pressure Injury 04/23/21 Buttocks Left Unstageable - Full thickness tissue loss in which the base of the injury is covered by slough (yellow, tan, gray, green or brown) and/or eschar (tan, brown or black) in the wound bed. Unstageable to left buttock wit (Active)  Wound Image   04/26/21 1430  Dressing Type ABD;Barrier Film (skin prep);Moist to moist;Dakin's-soaked gauze 04/26/21 1430  Dressing Changed;Clean;Dry;Intact 04/26/21 1430  Dressing Change Frequency Daily 04/26/21 1430  State of Healing Eschar 04/26/21 1430  Site / Wound Assessment Pink;Yellow;Brown 04/26/21 1430  % Wound base Red or Granulating 5% 04/26/21 1430  % Wound base Yellow/Fibrinous Exudate 50% 04/26/21 1430  % Wound base Black/Eschar 45% 04/26/21 1430  % Wound base Other/Granulation  Tissue (Comment) 0% 04/26/21 1430  Peri-wound Assessment Intact;Pink 04/26/21 1430  Wound Length (cm) 7 cm 04/26/21 1430  Wound Width (cm) 4 cm 04/26/21 1430  Wound Depth (cm) 0.1 cm 04/26/21 1430  Wound Surface Area (cm^2) 28 cm^2 04/26/21 1430  Wound Volume (cm^3) 2.8 cm^3 04/26/21 1430  Tunneling (cm) 0 04/26/21 1430  Undermining (cm) 0 04/26/21 1430  Margins Unattached edges (unapproximated) 04/26/21 1430  Drainage Amount Minimal 04/26/21 1430  Drainage Description Serosanguineous;Odor 04/26/21 1430  Treatment Debridement (Selective);Hydrotherapy (Pulse lavage);Packing (Saline gauze) 04/26/21 1430      Hydrotherapy Pulsed lavage therapy - wound location: L buttock Pulsed Lavage with Suction (psi): 12 psi Pulsed Lavage with Suction - Normal Saline Used: 1000 mL Pulsed Lavage Tip: Tip with splash shield Selective Debridement Selective Debridement - Location: L buttock Selective Debridement - Tools Used: Forceps;Scalpel Selective Debridement - Tissue Removed: Eschar   Wound Assessment and Plan  Wound Therapy - Assess/Plan/Recommendations Wound Therapy - Clinical Statement: Pt presents to hydrotherapy with an unstagable pressure injury on the L buttock. Debridement initiated this session. This patient will benefit from continued hydrotherapy for selective removal of unviable tissue, to decrease bioburden, and promote wound bed healing. Wound Therapy - Functional Problem List: SCI - decreased tolerance for position changes; immobility Factors Delaying/Impairing Wound Healing: Immobility;Altered sensation Hydrotherapy Plan: Debridement;Patient/family education;Dressing change;Pulsatile lavage with suction Wound Therapy - Frequency: 6X / week Wound Therapy - Follow Up Recommendations: dressing changes by RN  Wound Therapy Goals- Improve the function of patient's integumentary system by progressing the wound(s) through the phases of wound healing (inflammation - proliferation -  remodeling) by: Decrease Necrotic Tissue to: 20% Decrease Necrotic Tissue - Progress: Goal set  today Increase Granulation Tissue to: 80% Increase Granulation Tissue - Progress: Goal set today Improve Drainage Characteristics: Min;Serous Improve Drainage Characteristics - Progress: Goal set today Goals/treatment plan/discharge plan were made with and agreed upon by patient/family: Yes Time For Goal Achievement: 7 days Wound Therapy - Potential for Goals: Good  Goals will be updated until maximal potential achieved or discharge criteria met.  Discharge criteria: when goals achieved, discharge from hospital, MD decision/surgical intervention, no progress towards goals, refusal/missing three consecutive treatments without notification or medical reason.  Laura D Kirkman 04/26/2021, 3:25 PM  Laura Kirkman, PT, DPT Acute Rehabilitation Services Pager: [phone] Office: [phone]       

## 2021-04-26 NOTE — Progress Notes (Signed)
Occupational Therapy Session Note  Patient Details  Name: Luke Wright MRN: [id number] Date of Birth: 1971-03-20  Today's Date: 04/27/2021 OT Individual Time: 1001-1057 OT Individual Time Calculation (min): 56 min   Short Term Goals: Week 1:  OT Short Term Goal 1 (Week 1): Pt will self feed with AE PRN with Max A OT Short Term Goal 2 (Week 1): Pt will brush teeth with AE with Max A OT Short Term Goal 3 (Week 1): Pt will roll L/R with 1 assist to decrease caregiver burden OT Short Term Goal 4 (Week 1): Pt will tolerate wearing B elbow splints for 2 hours with no redness/irritation  Skilled Therapeutic Interventions/Progress Updates:    Pt greeted in bed, just finished hydrotherapy with acute PT and RN present. Trach collar already deflated and pt wearing PMSV. Pt with no c/o pain, agreeable to session. OT first assisted with washing lower legs and donning Teds + ACE wraps. Discussed with pt importance of directing care for LB hygiene/skin inspection due to absent sensation and infection risk. Also worked on B hip stretching at this time with pt able to tolerate reclined figure 4. BP while supine 110/61. +2 for supine<sit where pt engaged in oral care (thorough suction after), face washing, and hair combing. Pt required assistance for all stated tasks due to B UE contractures. Also worked on improving core strength/balance corrections, pt requiring Total A of 1 for upright sitting balance. Worked on anterior/posterior weight shifting and also assisting with correcting Rt lateral LOBs. Note that pts 02 sats were 82-92% via PMSV, pt trying to mobilize mucus but ultimately unsuccessful. Pt returned to bed and +2 assist provided for donning pants and for boosting up in bed. Tried to find a way to fashion a device in order for pt to perform self suction himself but unsuccessful. Notified RN of pts sats and difficultly with mucus mobilization. Left pt via direct PT handoff.   Therapy  Documentation Precautions:  Precautions Precautions: Fall Precaution Comments: C5 SCI, Asia A, trach Restrictions Weight Bearing Restrictions: No  ADL: ADL Equipment Provided: Feeding equipment Eating: Dependent (simulated at eval) Grooming: Dependent (simulated at eval) Upper Body Bathing: Dependent (simulated at eval) Lower Body Bathing: Dependent (simulated at eval) Upper Body Dressing: Dependent (simulated at eval) Lower Body Dressing: Dependent (simulated at eval) Toileting: Not assessed Toilet Transfer: Not assessed (not appropriate at this time) ADL Comments: note all ADLs simulated 2/2 time constraints      Therapy/Group: Individual Therapy  Carmeron Heady A Jenner Rosier 04/27/2021, 12:34 PM

## 2021-04-26 NOTE — Progress Notes (Signed)
Speech Language Pathology Daily Session Note  Patient Details  Name: Luke Wright MRN: [id number] Date of Birth: 05/23/1971  Today's Date: 04/26/2021 SLP Individual Time: 8850-2774 SLP Individual Time Calculation (min): 40 min  Short Term Goals: Week 1: SLP Short Term Goal 1 (Week 1): Patient will consume current diet with Mod I for use of swallowing compensatory strategies. SLP Short Term Goal 2 (Week 1): Patient will demonstrate efficient mastication and complete oral clearance with trials of regular textures without overt s/s of aspiration over 2 sessions with Mod I prior to upgrade. SLP Short Term Goal 3 (Week 1): Patient will participate in ongoing cognitive assessment to assess higher-level cognitive tasks. SLP Short Term Goal 4 (Week 1): Patient will demonstrate 90% speech intelligibility at the sentence level with supervision verbal cus for use of swallowing compensatory strategies. SLP Short Term Goal 5 (Week 1): Patient will wear PMSV without O2 saturations dropping below 85% and without reports/signs of distress throughout an entirety of a session.  Skilled Therapeutic Interventions:   Patient seen for skilled ST session focusing on speech and swallow goals. He had recently been suctioned and voice was significantly improved today as compared to yesterday with no instances of audible secretions or coughing noted. He tolerated PMV for full 40 minutes of session without any overt s/s difficulty, SpO2 average of 93% and did not drop below 85%. He consumed entire breakfast meal tray with efficient mastication, swallow initiation and without overt s/s aspiration or penetration. Patient requesting to wear PMV when his daughter arrives later this afternoon so he can talk to her. Patient left in bed with all needs in place. He continues to benefit from skilled SLP intervention to maximize swallow and speech function prior to discharge.  Pain Pain Assessment Pain Scale: 0-10 Pain Score:  0-No pain  Therapy/Group: Individual Therapy  Angela Nevin, MA, CCC-SLP Speech Therapy
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
## 2021-04-26 NOTE — Progress Notes (Addendum)
Patient ID: Luke Wright, male   DOB: 1970/08/19, 50 y.o.   MRN: [id number]  SW spoke with pt dtr Luke Wright to discuss family meeting. Family meeting scheduled for 10/20 at 10am. SW also discussed potential services that can be put in place. Reports Luke Wright with [employer] in [place], [place] was working on this case and referred to [place]. SW informed will f/u with [name]/liaison over private duty nursing. SW also discussed ELOS 30-35 days and will discuss with her father tomorrow.  SW spoke with [name] [phone]) to discuss referral. Reports pt is being reviewed, and will work on setting up an assessment to be completed with pt and family in hospital if possible. Reports he will follow-up after speaking with his clinical manager.   [name], MSW, LCSWA Office: [phone] Cell: [phone] Fax: [phone]
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
## 2021-04-26 NOTE — Progress Notes (Signed)
Pharmacy Antibiotic Note  Luke Wright is a 50 y.o. male admitted on 04/23/2021 with suspected pneumonia.  Patient is reported to have copious green secretions out of his tracheostomy tube Chest x-ray independently reviewed shows left lower lobe consolidation. Pharmacy has been consulted for Unasyn dosing.  Plan: Unasyn 3g IV q6h   Height: 5' 10.87" (180 cm) Weight: 111 kg (244 lb 11.4 oz) IBW/kg (Calculated) : 75  Temp (24hrs), Avg:98.2 F (36.8 C), Min:98 F (36.7 C), Max:98.5 F (36.9 C)  Recent Labs  Lab 04/24/21 0515  WBC 8.2  CREATININE 0.61    Estimated Creatinine Clearance: 139.7 mL/min (by C-G formula based on SCr of 0.61 mg/dL).    No Known Allergies  Antimicrobials this admission: Cefepime 10/11 > 10/13  Microbiology results: 10/11 trach aspirate cx: MORAXELLA CATARRHALIS   Thank you for allowing pharmacy to be a part of this patient's care.  Thelma Barge, PharmD Clinical Pharmacist

## 2021-04-27 ENCOUNTER — Telehealth: Payer: Self-pay | Admitting: Physical Medicine & Rehabilitation

## 2021-04-27 DIAGNOSIS — G8250 Quadriplegia, unspecified: Secondary | ICD-10-CM | POA: Diagnosis not present

## 2021-04-27 DIAGNOSIS — Y95 Nosocomial condition: Secondary | ICD-10-CM

## 2021-04-27 MED ORDER — ONABOTULINUMTOXINA 100 UNITS IJ SOLR
400.0000 [IU] | Freq: Once | INTRAMUSCULAR | Status: AC
  Administered 2021-05-01: 400 [IU] via INTRAMUSCULAR
  Filled 2021-04-27: qty 400

## 2021-04-27 MED ORDER — SODIUM CHLORIDE 0.9 % IV SOLN
INTRAVENOUS | Status: DC | PRN
  Administered 2021-04-27 – 2021-04-28 (×2): 250 mL via INTRAVENOUS

## 2021-04-27 NOTE — Progress Notes (Signed)
Occupational Therapy Session Note  Patient Details  Name: Luke Wright MRN: [id number] Date of Birth: [birth year]  Today's Date: 04/28/2021 OT Individual Time: 3536-1443 OT Individual Time Calculation (min): 60 min   Short Term Goals: Week 1:  OT Short Term Goal 1 (Week 1): Pt will self feed with AE PRN with Max A OT Short Term Goal 2 (Week 1): Pt will brush teeth with AE with Max A OT Short Term Goal 3 (Week 1): Pt will roll L/R with 1 assist to decrease caregiver burden OT Short Term Goal 4 (Week 1): Pt will tolerate wearing B elbow splints for 2 hours with no redness/irritation  Skilled Therapeutic Interventions/Progress Updates:    Pt greeted in bed, RN present administering medication. Pt opted to eat in bed this AM vs in the w/c. +2 for boosting pt up in bed and HOB elevated for swallowing safety, PMSV donned. Pt c/o pain in Rt arm with certain movements due to tone, movement/stretching to tolerance completed during session to address therapeutically, repositioning utilized at close of session as well. Pt issued a dorsal arm cuff for self feeding to improve wrist alignment/overall independence while eating breakfast. Pt reported not liking the smaller universal cuff. Pt needed to have forkfuls of food speared in order to bring food to mouth given Min A, OT assisted with elbow extension towards plate for tone management. Prolonged elbow extension performed while he chewed. He coughed x2 during meal. Total A for beverage management. Total A to wash face and hands afterwards (breakfast condiment included maple syrup). He would benefit from a hand mitt to increase independence with face washing. 02 sats 91-94% via trach collar while eating today. He remained in care of RT at end of tx, call bell placed behind head with pt exhibiting the ability to press it to call for staff assistance when needed.    Therapy Documentation Precautions:  Precautions Precautions: Fall Precaution Comments: C5  SCI, Asia A, trach Restrictions Weight Bearing Restrictions: No  Vital Signs: Oxygen Therapy SpO2: 97 % O2 Device: Tracheostomy Collar O2 Flow Rate (L/min): 8 L/min FiO2 (%): 35 % Pain Assessment Pain Scale: 0-10 Pain Score: 0-No pain Faces Pain Scale: No hurt Pain Type: Acute pain ADL: ADL Equipment Provided: Feeding equipment Eating: Dependent (simulated at eval) Grooming: Dependent (simulated at eval) Upper Body Bathing: Dependent (simulated at eval) Lower Body Bathing: Dependent (simulated at eval) Upper Body Dressing: Dependent (simulated at eval) Lower Body Dressing: Dependent (simulated at eval) Toileting: Not assessed Toilet Transfer: Not assessed (not appropriate at this time) ADL Comments: note all ADLs simulated 2/2 time constraints    Therapy/Group: Individual Therapy  Carlyon Nolasco A Kewanna Kasprzak 04/28/2021, 12:38 PM
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
## 2021-04-27 NOTE — Progress Notes (Signed)
Physical Therapy Session Note  Patient Details  Name: Luke Wright MRN: [id number] Date of Birth: 1971-07-13  Today's Date: 04/27/2021 PT Individual Time: 8032-1224 PT Individual Time Calculation (min): 90 min   Short Term Goals: Week 1:  PT Short Term Goal 1 (Week 1): Pt will tolerate sitting up in chair x 1 hour PT Short Term Goal 2 (Week 1): Pt will initiate power wheelchair mobility PT Short Term Goal 3 (Week 1): Pt will recall pressure relief schedule and pressure relief techniques  Skilled Therapeutic Interventions/Progress Updates: Pt presented in TIS with daughter present agreeable to therapy. Pt noted to have TED hose, ace bandages on and pants. Pt transported to rehab gym total A and performed Maxi sky transfer to high/low mat. Pt was able to tolerate sitting with total A for ~3 min without c/o OH. Due to time of therapy tech pt transported back to TIS, PTA noted foul odor and wet spot on pants. Pt then transported to room and performed mechanical lift transfer to bed. Pt was total A x 2 for rolling L/R to allow for peri-care and brief change. Once completed pt transferred back to TIS. Pt then noted to have increased coughing attempts. Several attempts made for quad cough however unsuccessful, nsg notifed and respiratory contacted for deep suction. During this time PTA performed ROM to BLE and gentle stretching to BUE at elbows and wrists. PTA also discussed with pt's daughter purpose of quad cough, ROM, repositioning, etc. PTA also advised daughter that she can perform ROM to pt at times when therapies are not present (weekends). At end of session pt transported back to bed via mechanical lift. PTA and tech repositioned pt in bed and placed pillows under arms and through elbows to avoid hands coming to face. Pt left in nsg care for cath with PTA notifying pt that call bell still needs to be placed once completed.     Therapy Documentation Precautions:  Precautions Precautions:  Fall Precaution Comments: C5 SCI, [place] A, trach Restrictions Weight Bearing Restrictions: No General:   Vital Signs: Therapy Vitals Pulse Rate: 79 Resp: 18 Patient Position (if appropriate): Sitting Oxygen Therapy SpO2: 94 % O2 Device: Tracheostomy Collar O2 Flow Rate (L/min): 8 L/min FiO2 (%): 35 % Pain:   Mobility:   Locomotion :    Trunk/Postural Assessment :    Balance:   Exercises:   Other Treatments:      Therapy/Group: Individual Therapy  Axel Meas 04/27/2021, 4:41 PM

## 2021-04-27 NOTE — Care Management (Signed)
Inpatient Rehabilitation Center Individual Statement of Services  Patient Name:  Luke Wright  Date:  04/27/2021  Welcome to the Inpatient Rehabilitation Center.  Our goal is to provide you with an individualized program based on your diagnosis and situation, designed to meet your specific needs.  With this comprehensive rehabilitation program, you will be expected to participate in at least 3 hours of rehabilitation therapies Monday-Friday, with modified therapy programming on the weekends.  Your rehabilitation program will include the following services:  Physical Therapy (PT), Occupational Therapy (OT), Speech Therapy (ST), 24 hour per day rehabilitation nursing, Therapeutic Recreaction (TR), Psychology, Neuropsychology, Care Coordinator, Rehabilitation Medicine, Nutrition Services, Pharmacy Services, and Other  Weekly team conferences will be held on Tuesdays to discuss your progress.  Your Inpatient Rehabilitation Care Coordinator will talk with you frequently to get your input and to update you on team discussions.  Team conferences with you and your family in attendance may also be held.  Expected length of stay: 30-35 days    Overall anticipated outcome: Total Assistance  Depending on your progress and recovery, your program may change. Your Inpatient Rehabilitation Care Coordinator will coordinate services and will keep you informed of any changes. Your Inpatient Rehabilitation Care Coordinator's name and contact numbers are listed  below.  The following services may also be recommended but are not provided by the Inpatient Rehabilitation Center:  Driving Evaluations Home Health Rehabiltiation Services Outpatient Rehabilitation Services Vocational Rehabilitation   Arrangements will be made to provide these services after discharge if needed.  Arrangements include referral to agencies that provide these services.  Your insurance has been verified to be:  [facility] Medicare  Your  primary doctor is:  Default Provider  Pertinent information will be shared with your doctor and your insurance company.  Inpatient Rehabilitation Care Coordinator:  Susie Cassette [phone] or ([phone]  Information discussed with and copy given to patient by: Gretchen Short, 04/27/2021, 9:33 AM
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
## 2021-04-27 NOTE — Progress Notes (Signed)
Physical Therapy Session Note  Patient Details  Name: Luke Wright MRN: [id number] Date of Birth: 1971-05-11  Today's Date: 04/27/2021 PT Individual Time: 1115-1200 PT Individual Time Calculation (min): 45 min  and Today's Date: 04/27/2021 PT Missed Time: 15 Minutes Missed Time Reason: Other (Comment) (respiratory care)  Short Term Goals: Week 1:  PT Short Term Goal 1 (Week 1): Pt will tolerate sitting up in chair x 1 hour PT Short Term Goal 2 (Week 1): Pt will initiate power wheelchair mobility PT Short Term Goal 3 (Week 1): Pt will recall pressure relief schedule and pressure relief techniques  Skilled Therapeutic Interventions/Progress Updates:    Pt received semi reclined in bed at end of OT session. OT reported that O2 had been low throughout past hour. Shortly  after arrival, respiratory therapy arrived for suctioning, Pt missed 15 minutes scheduled time for respiratory care. Sit<>Supine with +2 assist for LE and trunk management.  Tot A for sitting balance on EOB. Pt returned to bed and required max A for rolling to place maxi move sling. Maxi move transfer to TIS chair. While seated in chair, therapist provided elbow extension stretch with deep pressure to biceps tendon, with mild increase in range noted. Pt positioned with pillows and towel roll to attempt to maintain lengthened position while resting in chair. Pt was tilted to comfortable position and soft touch call bell positioned near pt's head and tested to confirm placement at end of session.   Therapy Documentation Precautions:  Precautions Precautions: Fall Precaution Comments: C5 SCI, [place] A, trach Restrictions Weight Bearing Restrictions: No General: PT Amount of Missed Time (min): 15 Minutes PT Missed Treatment Reason: Other (Comment) (respiratory care)    Therapy/Group: Individual Therapy  Juluis Rainier 04/27/2021, 12:20 PM

## 2021-04-27 NOTE — Progress Notes (Signed)
Physical Therapy Wound Treatment Patient Details  Name: Luke Wright MRN: [id number] Date of Birth: 05/22/1971  Today's Date: 04/27/2021 PT Individual Time: 0930-1005 PT Individual Time Calculation (min): 35 min   Subjective  Subjective: Pt pleasant and agreeable to hydrotherapy Patient and Family Stated Goals: None stated Date of Onset:  (Unknown) Prior Treatments: Dressing changes; Santyl  Pain Score: Pain Score: 0-No pain  Wound Assessment  Pressure Injury 04/23/21 Buttocks Left Unstageable - Full thickness tissue loss in which the base of the injury is covered by slough (yellow, tan, gray, green or brown) and/or eschar (tan, brown or black) in the wound bed. Unstageable to left buttock wit (Active)  Dressing Type ABD;Barrier Film (skin prep);Dakin's-soaked gauze;Moist to moist 04/27/21 1021  Dressing Changed;Clean;Dry;Intact 04/27/21 1021  Dressing Change Frequency Daily 04/27/21 1021  State of Healing Eschar 04/27/21 1021  Site / Wound Assessment Yellow;Brown;Pink 04/27/21 1021  % Wound base Red or Granulating 10% 04/27/21 1021  % Wound base Yellow/Fibrinous Exudate 85% 04/27/21 1021  % Wound base Black/Eschar 5% 04/27/21 1021  % Wound base Other/Granulation Tissue (Comment) 0% 04/27/21 1021  Peri-wound Assessment Intact 04/27/21 1021  Wound Length (cm) 7 cm 04/26/21 1430  Wound Width (cm) 4 cm 04/26/21 1430  Wound Depth (cm) 0.1 cm 04/26/21 1430  Wound Surface Area (cm^2) 28 cm^2 04/26/21 1430  Wound Volume (cm^3) 2.8 cm^3 04/26/21 1430  Tunneling (cm) 0 04/26/21 1430  Undermining (cm) 0 04/26/21 1430  Margins Unattached edges (unapproximated) 04/27/21 1021  Drainage Amount Minimal 04/27/21 1021  Drainage Description Serosanguineous 04/27/21 1021  Treatment Debridement (Selective);Hydrotherapy (Pulse lavage);Packing (Saline gauze) 04/27/21 1021      Hydrotherapy Pulsed lavage therapy - wound location: L buttock Pulsed Lavage with Suction (psi): 12 psi Pulsed Lavage  with Suction - Normal Saline Used: 1000 mL Pulsed Lavage Tip: Tip with splash shield Selective Debridement Selective Debridement - Location: L buttock Selective Debridement - Tools Used: Forceps;Scalpel Selective Debridement - Tissue Removed: Eschar   Wound Assessment and Plan  Wound Therapy - Assess/Plan/Recommendations Wound Therapy - Clinical Statement: Progressing with debridement. Overall wound bed appears improved. This patient will benefit from continued hydrotherapy for selective removal of unviable tissue, to decrease bioburden, and promote wound bed healing. Wound Therapy - Functional Problem List: SCI - decreased tolerance for position changes; immobility Factors Delaying/Impairing Wound Healing: Immobility;Altered sensation Hydrotherapy Plan: Debridement;Patient/family education;Dressing change;Pulsatile lavage with suction Wound Therapy - Frequency: 6X / week Wound Therapy - Follow Up Recommendations: dressing changes by RN  Wound Therapy Goals- Improve the function of patient's integumentary system by progressing the wound(s) through the phases of wound healing (inflammation - proliferation - remodeling) by: Decrease Necrotic Tissue to: 20% Decrease Necrotic Tissue - Progress: Progressing toward goal Increase Granulation Tissue to: 80% Increase Granulation Tissue - Progress: Progressing toward goal Improve Drainage Characteristics: Min;Serous Improve Drainage Characteristics - Progress: Progressing toward goal Goals/treatment plan/discharge plan were made with and agreed upon by patient/family: Yes Time For Goal Achievement: 7 days Wound Therapy - Potential for Goals: Good  Goals will be updated until maximal potential achieved or discharge criteria met.  Discharge criteria: when goals achieved, discharge from hospital, MD decision/surgical intervention, no progress towards goals, refusal/missing three consecutive treatments without notification or medical reason.  GP      Thelma Comp 04/27/2021, 10:25 AM  Rolinda Roan, PT, DPT Acute Rehabilitation Services Pager: [phone] Office: [phone]

## 2021-04-27 NOTE — Telephone Encounter (Signed)
Prior auth not reqd for botox/and 34035 injection of: per ref # [id number]

## 2021-04-27 NOTE — Progress Notes (Signed)
Respiratory culture has shown Moraxella and Enterobacter, so cefepime will be continued. PCCM to see again next week, if secretions are decreased we can consider downsizing to cuffless trach  Jovi Zavadil V. Vassie Loll MD

## 2021-04-27 NOTE — Progress Notes (Signed)
Pharmacy Antibiotic Note  Luke Wright is a 50 y.o. male admitted on 04/23/2021 with suspected pneumonia.  Patient is reported to have copious green secretions out of his tracheostomy tube Chest x-ray independently reviewed shows left lower lobe consolidation. Pharmacy has been consulted for Cefepime dosing.  Per discussion with ID, respiratory cultures now growing rare enterobacter cloacae R to Unasyn in addition to moraxella catarrhalis. Due to condition of patient, will conservatively treat and change back to cefepime for coverage of both moraxella and enterobacter at this time.   Plan: Cefepime 2g Q8H F/u renal function, improvement in condition, duration of therapy  Height: 5' 10.87" (180 cm) Weight: 111 kg (244 lb 11.4 oz) IBW/kg (Calculated) : 75  Temp (24hrs), Avg:98.2 F (36.8 C), Min:98 F (36.7 C), Max:98.4 F (36.9 C)  Recent Labs  Lab 04/24/21 0515  WBC 8.2  CREATININE 0.61    Estimated Creatinine Clearance: 139.7 mL/min (by C-G formula based on SCr of 0.61 mg/dL).    No Known Allergies  Antimicrobials this admission: Cefepime 10/11 > 10/13, 10/13 >> Unasyn 10/13  Microbiology results: 10/11 trach aspirate cx: MORAXELLA CATARRHALIS, RARE ENTEROBACTER CLOACAE   Thank you for allowing pharmacy to be a part of this patient's care.  Thelma Barge, PharmD Clinical Pharmacist

## 2021-04-28 DIAGNOSIS — E46 Unspecified protein-calorie malnutrition: Secondary | ICD-10-CM

## 2021-04-28 DIAGNOSIS — D62 Acute posthemorrhagic anemia: Secondary | ICD-10-CM

## 2021-04-28 DIAGNOSIS — G8250 Quadriplegia, unspecified: Secondary | ICD-10-CM | POA: Diagnosis not present

## 2021-04-28 DIAGNOSIS — K592 Neurogenic bowel, not elsewhere classified: Secondary | ICD-10-CM | POA: Diagnosis not present

## 2021-04-28 DIAGNOSIS — L89150 Pressure ulcer of sacral region, unstageable: Secondary | ICD-10-CM | POA: Diagnosis not present

## 2021-04-28 DIAGNOSIS — E8809 Other disorders of plasma-protein metabolism, not elsewhere classified: Secondary | ICD-10-CM | POA: Diagnosis not present

## 2021-04-28 DIAGNOSIS — I10 Essential (primary) hypertension: Secondary | ICD-10-CM

## 2021-04-28 MED ORDER — COLLAGENASE 250 UNIT/GM EX OINT
TOPICAL_OINTMENT | Freq: Every day | CUTANEOUS | Status: DC
  Filled 2021-04-28 (×3): qty 30

## 2021-04-28 MED ORDER — PROSOURCE PLUS PO LIQD
30.0000 mL | Freq: Two times a day (BID) | ORAL | Status: DC
  Administered 2021-04-28 – 2021-05-10 (×13): 30 mL via ORAL
  Filled 2021-04-28 (×23): qty 30
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
## 2021-04-28 NOTE — Progress Notes (Signed)
Speech Language Pathology Daily Session Note  Patient Details  Name: Luke Wright MRN: [id number] Date of Birth: 1970-10-18  Today's Date: 04/28/2021  Session 1: SLP Individual Time: 1055-1110 SLP Individual Time Calculation (min): 15 min  Session 2: SLP Individual Time: 7829-5621 SLP Individual Time Calculation (min): 15 min  Short Term Goals: Week 1: SLP Short Term Goal 1 (Week 1): Patient will consume current diet with Mod I for use of swallowing compensatory strategies. SLP Short Term Goal 2 (Week 1): Patient will demonstrate efficient mastication and complete oral clearance with trials of regular textures without overt s/s of aspiration over 2 sessions with Mod I prior to upgrade. SLP Short Term Goal 3 (Week 1): Patient will participate in ongoing cognitive assessment to assess higher-level cognitive tasks. SLP Short Term Goal 4 (Week 1): Patient will demonstrate 90% speech intelligibility at the sentence level with supervision verbal cus for use of swallowing compensatory strategies. SLP Short Term Goal 5 (Week 1): Patient will wear PMSV without O2 saturations dropping below 85% and without reports/signs of distress throughout an entirety of a session.  Skilled Therapeutic Interventions:  Session 1: Skilled treatment session focused on dysphagia goals. SLP facilitated session by providing trials of regular textures ([employer] Day). SLP provided small boluses. Patient demonstrated efficient mastication with complete oral clearance without overt s/s of aspiration. Recommend trial tray prior to upgrade. Session ended early due to hydro present. Continue with current plan of care.   Session 2: Skilled treatment session focused on speech goals. Upon arrival, patient with his PMSV in place without a staff member present. SLP re-educated patient on importance of having supervision with PMSV due to his inability to remove it if needed. Patient's vitals remained [facility] and patient required Min verbal  cues for use of verbal expression instead of gestures and use of an increased vocal intensity. Patient left upright in bed with PMSV removed and soft call touch within reach. Continue with current plan of care.   Pain Pain Assessment Pain Scale: 0-10 Pain Score: 0-No pain Faces Pain Scale: No hurt Pain Type: Acute pain  Therapy/Group: Individual Therapy  Darlene Brozowski 04/28/2021, 12:20 PM

## 2021-04-28 NOTE — Progress Notes (Signed)
Physical Therapy Session Note  Patient Details  Name: Luke Wright MRN: [id number] Date of Birth: Apr 21, 1971  Today's Date: 04/28/2021 PT Individual Time: 1000-1054 PT Individual Time Calculation (min): 54 min   Short Term Goals: Week 1:  PT Short Term Goal 1 (Week 1): Pt will tolerate sitting up in chair x 1 hour PT Short Term Goal 2 (Week 1): Pt will initiate power wheelchair mobility PT Short Term Goal 3 (Week 1): Pt will recall pressure relief schedule and pressure relief techniques  Skilled Therapeutic Interventions/Progress Updates:     Pt supine in bed on arrival - on wall oxygen via trach collar with PMV on - sleeping soundly with O2 at 98% and HR in the 70's. He awakens to voice and is agreeable to PT tx. Worked on UE ROM to tolerance with bias towards biceps and posterior shoulder capsule. Instructed on isometric bicep curls and AAROM horrizontal adduction bilaterally. Also worked on hand to mouth with stronger RUE - he reports ability to use U-cuff efficiently while eating his breakfast this morning. Donned thigh-high TED's and ace wrapped LE's to reduce risk of OH during sitting. Adjusted HOB gradually to tolerance prior to sitting EOB (pt in sizewize bed). Required +2 dependant assist for semi-reclined to sitting EOB and then required +2 dependant assist for sitting EOB. He did not c/o dizziness or lightheadedness and was able to tolerate upright 90deg sitting for >5 minutes. Assisted back to bed with +2 dependant assist and required dependant assist for scooting up in the bed. Deferred hoyer transfer to TIS w/c today as pt scheduled to receive [facility] PT for wound care immediately after session. Pillows provided for comfort and soft call bell in reach at conclusion of session. All needs met. RN entering for medications - hand off of care.   Therapy Documentation Precautions:  Precautions Precautions: Fall Precaution Comments: C5 SCI, [place] A, trach Restrictions Weight Bearing  Restrictions: No General:    Therapy/Group: Individual Therapy  [name] [name] 04/28/2021, 7:22 AM

## 2021-04-28 NOTE — Progress Notes (Signed)
Physical Therapy Wound Treatment Patient Details  Name: Luke Wright MRN: [id number] Date of Birth: Mar 27, 1971  Today's Date: 04/28/2021 Time: 1110-1140 Time Calculation (min): 30 min  Subjective  Subjective Assessment Subjective: Pt pleasant and agreeable to hydrotherapy Patient and Family Stated Goals: None stated Prior Treatments: Dressing changes; Santyl  Pain Score:    Wound Assessment  Pressure Injury 04/23/21 Buttocks Left Unstageable - Full thickness tissue loss in which the base of the injury is covered by slough (yellow, tan, gray, green or brown) and/or eschar (tan, brown or black) in the wound bed. Unstageable to left buttock wit (Active)  Wound Image   04/26/21 1430  Dressing Type Dakin's-soaked gauze;ABD;Tape dressing 04/28/21 1257  Dressing Changed 04/28/21 1257  Dressing Change Frequency Daily 04/28/21 1257  State of Healing Eschar 04/28/21 1257  Site / Wound Assessment Yellow;Brown;Pink 04/27/21 1021  % Wound base Red or Granulating 10% 04/28/21 1257  % Wound base Yellow/Fibrinous Exudate 85% 04/28/21 1257  % Wound base Black/Eschar 5% 04/28/21 1257  % Wound base Other/Granulation Tissue (Comment) 0% 04/28/21 1257  Peri-wound Assessment Intact 04/28/21 1257  Wound Length (cm) 7 cm 04/26/21 1430  Wound Width (cm) 4 cm 04/26/21 1430  Wound Depth (cm) 0.1 cm 04/26/21 1430  Wound Surface Area (cm^2) 28 cm^2 04/26/21 1430  Wound Volume (cm^3) 2.8 cm^3 04/26/21 1430  Tunneling (cm) 0 04/26/21 1430  Undermining (cm) 0 04/26/21 1430  Margins Unattached edges (unapproximated) 04/28/21 1257  Drainage Amount Minimal 04/28/21 1257  Drainage Description Serosanguineous 04/28/21 1257  Treatment Debridement (Selective);Hydrotherapy (Pulse lavage);Packing (Saline gauze) 04/28/21 1257      Hydrotherapy Pulsed lavage therapy - wound location: L buttock Pulsed Lavage with Suction (psi): 12 psi Pulsed Lavage with Suction - Normal Saline Used: 1000 mL Pulsed Lavage Tip:  Tip with splash shield Selective Debridement Selective Debridement - Location: L buttock Selective Debridement - Tools Used: Forceps, Scissors Selective Debridement - Tissue Removed: Eschar    Wound Assessment and Plan  Wound Therapy - Assess/Plan/Recommendations Wound Therapy - Clinical Statement: Progressing with debridement. Overall wound bed appears improved. This patient will benefit from continued hydrotherapy for selective removal of unviable tissue, to decrease bioburden, and promote wound bed healing. Wound Therapy - Functional Problem List: SCI - decreased tolerance for position changes; immobility Factors Delaying/Impairing Wound Healing: Immobility, Altered sensation Hydrotherapy Plan: Debridement, Patient/family education, Dressing change, Pulsatile lavage with suction Wound Therapy - Frequency: 6X / week Wound Therapy - Follow Up Recommendations: dressing changes by RN  Wound Therapy Goals- Improve the function of patient's integumentary system by progressing the wound(s) through the phases of wound healing (inflammation - proliferation - remodeling) by: Wound Therapy Goals - Improve the function of patient's integumentary system by progressing the wound(s) through the phases of wound healing by: Decrease Necrotic Tissue to: 20% Decrease Necrotic Tissue - Progress: Progressing toward goal Increase Granulation Tissue to: 80% Increase Granulation Tissue - Progress: Progressing toward goal Improve Drainage Characteristics: Min, Serous Improve Drainage Characteristics - Progress: Progressing toward goal  Goals will be updated until maximal potential achieved or discharge criteria met.  Discharge criteria: when goals achieved, discharge from hospital, MD decision/surgical intervention, no progress towards goals, refusal/missing three consecutive treatments without notification or medical reason.  GP     Charges PT Wound Care Charges $Wound Debridement up to 20 cm: < or equal  to 20 cm $ Wound Debridement each add'l 20 sqcm: 1 $PT PLS Gun and Tip: 1 Supply $PT Hydrotherapy Visit: 1 Visit  Shanna Cisco 04/28/2021, 1:01 PM 04/28/2021 Lesleigh Noe, PT Acute Rehabilitation Services Pager:  [phone] Office:  [phone]

## 2021-04-29 DIAGNOSIS — K592 Neurogenic bowel, not elsewhere classified: Secondary | ICD-10-CM | POA: Diagnosis not present

## 2021-04-29 DIAGNOSIS — R1310 Dysphagia, unspecified: Secondary | ICD-10-CM

## 2021-04-29 DIAGNOSIS — D62 Acute posthemorrhagic anemia: Secondary | ICD-10-CM | POA: Diagnosis not present

## 2021-04-29 DIAGNOSIS — G8250 Quadriplegia, unspecified: Secondary | ICD-10-CM | POA: Diagnosis not present

## 2021-04-29 DIAGNOSIS — I10 Essential (primary) hypertension: Secondary | ICD-10-CM | POA: Diagnosis not present

## 2021-04-29 NOTE — Progress Notes (Signed)
RT arrived to room and patient 89% on 35% ATC. Pt very congested/rhonchi. RT suctioned pt and pt desat to 66% according to monitor. RT pulled out inner cannula and provided 98% FIo2 via trach collar. I cleaned inner cannula (no disposables at bedside/box empty), administered hypertonic saline and suctioned again and SPo2 currently 94%. RT will have charge RN order inner cannulas for patient.
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
## 2021-04-30 DIAGNOSIS — J156 Pneumonia due to other aerobic Gram-negative bacteria: Secondary | ICD-10-CM

## 2021-04-30 DIAGNOSIS — J9611 Chronic respiratory failure with hypoxia: Secondary | ICD-10-CM

## 2021-04-30 LAB — BASIC METABOLIC PANEL
Anion gap: 8 (ref 5–15)
BUN: 7 mg/dL (ref 6–20)
CO2: 26 mmol/L (ref 22–32)
Calcium: 9.2 mg/dL (ref 8.9–10.3)
Chloride: 102 mmol/L (ref 98–111)
Creatinine, Ser: 0.56 mg/dL — ABNORMAL LOW (ref 0.61–1.24)
GFR, Estimated: >60 mL/min (ref >60)
Glucose, Bld: 92 mg/dL (ref 70–99)
Potassium: 3.3 mmol/L — ABNORMAL LOW (ref 3.5–5.1)
Sodium: 136 mmol/L (ref 135–145)

## 2021-04-30 LAB — CBC
HCT: 33.7 % — ABNORMAL LOW (ref 39.0–52.0)
Hemoglobin: 10.5 g/dL — ABNORMAL LOW (ref 13.0–17.0)
MCH: 26.4 pg (ref 26.0–34.0)
MCHC: 31.2 g/dL (ref 30.0–36.0)
MCV: 84.9 fL (ref 80.0–100.0)
Platelets: 411 K/uL — ABNORMAL HIGH (ref 150–400)
RBC: 3.97 MIL/uL — ABNORMAL LOW (ref 4.22–5.81)
RDW: 16.1 % — ABNORMAL HIGH (ref 11.5–15.5)
WBC: 11.6 K/uL — ABNORMAL HIGH (ref 4.0–10.5)
nRBC: 0 % (ref 0.0–0.2)

## 2021-04-30 LAB — URINALYSIS, ROUTINE W REFLEX MICROSCOPIC
Bacteria, UA: NONE SEEN
Bilirubin Urine: NEGATIVE
Glucose, UA: NEGATIVE mg/dL
Hgb urine dipstick: NEGATIVE
Ketones, ur: 5 mg/dL — AB
Leukocytes,Ua: NEGATIVE
Nitrite: NEGATIVE
Protein, ur: 30 mg/dL — AB
Specific Gravity, Urine: 1.016 (ref 1.005–1.030)
pH: 6 (ref 5.0–8.0)

## 2021-04-30 MED ORDER — ACETYLCYSTEINE 20 % IN SOLN
4.0000 mL | Freq: Three times a day (TID) | RESPIRATORY_TRACT | Status: DC
  Administered 2021-04-30 – 2021-05-05 (×13): 4 mL via RESPIRATORY_TRACT
  Filled 2021-04-30 (×16): qty 4

## 2021-04-30 MED ORDER — CLOPIDOGREL BISULFATE 75 MG PO TABS
75.0000 mg | ORAL_TABLET | Freq: Every day | ORAL | Status: DC
  Administered 2021-04-30 – 2021-05-03 (×4): 75 mg via ORAL
  Filled 2021-04-30 (×4): qty 1

## 2021-04-30 MED ORDER — GUAIFENESIN ER 600 MG PO TB12
1200.0000 mg | ORAL_TABLET | Freq: Two times a day (BID) | ORAL | Status: DC
  Administered 2021-04-30 – 2021-05-25 (×50): 1200 mg via ORAL
  Filled 2021-04-30 (×50): qty 2

## 2021-04-30 MED ORDER — POTASSIUM CHLORIDE CRYS ER 20 MEQ PO TBCR
40.0000 meq | EXTENDED_RELEASE_TABLET | Freq: Two times a day (BID) | ORAL | Status: AC
  Administered 2021-04-30 (×2): 40 meq via ORAL
  Filled 2021-04-30 (×2): qty 2

## 2021-04-30 MED ORDER — TRAZODONE HCL 50 MG PO TABS
150.0000 mg | ORAL_TABLET | Freq: Every day | ORAL | Status: DC
  Administered 2021-04-30: 150 mg via ORAL
  Filled 2021-04-30: qty 3

## 2021-04-30 NOTE — Progress Notes (Addendum)
Speech Language Pathology Daily Session Note  Patient Details  Name: Luke Wright MRN: [id number] Date of Birth: [birth year]  Today's Date: 04/30/2021 SLP Individual Time: 8250-5397 SLP Individual Time Calculation (min): 40 min  Short Term Goals: Week 1: SLP Short Term Goal 1 (Week 1): Patient will consume current diet with Mod I for use of swallowing compensatory strategies. SLP Short Term Goal 2 (Week 1): Patient will demonstrate efficient mastication and complete oral clearance with trials of regular textures without overt s/s of aspiration over 2 sessions with Mod I prior to upgrade. SLP Short Term Goal 3 (Week 1): Patient will participate in ongoing cognitive assessment to assess higher-level cognitive tasks. SLP Short Term Goal 4 (Week 1): Patient will demonstrate 90% speech intelligibility at the sentence level with supervision verbal cus for use of swallowing compensatory strategies. SLP Short Term Goal 5 (Week 1): Patient will wear PMSV without O2 saturations dropping below 85% and without reports/signs of distress throughout an entirety of a session.  Skilled Therapeutic Interventions: Pt received semi-reclined in bed and agreeable to skilled ST intervention with focus on swallowing and speech goals. Upon arrival SpO2 was between 92-94% without PMV in place. All vitals WNL. SLP donned PMSV and SpO2 ranged 89-90% for 40 minute duration with vitals remaining WFL and no signs of distress. Pt required sup-to-min A verbal cues for verbal expression to increase vocal intensity. Pt consumed breakfast containing Dys 3 solids (french toast), mixed consistencies (fruit cup), and thin liquids by straw with efficient oral preparation, oral clearance, and without overt s/sx of aspiration. Continue Dys 3 and thin liquids at this time with regular texture trial tray recommended. SLP removed PMSV at end of session and pt independently recalled that PMSV should only be worn with staff present. Patient was  left upright in bed with alarm activated and soft touch call bell within reach at end of session. Continue per current plan of care.      Pain Pain Assessment Pain Scale: 0-10 Pain Score: 0-No pain  Therapy/Group: Individual Therapy  Quinnie Barcelo T Georganne Siple 04/30/2021, 8:14 AM

## 2021-04-30 NOTE — Progress Notes (Signed)
Pharmacy Antibiotic Note  Luke Wright is a 50 y.o. male admitted on 04/23/2021 with suspected pneumonia.  Patient is reported to have copious green secretions out of his tracheostomy tube Chest x-ray independently reviewed shows left lower lobe consolidation. Pharmacy consulted for Cefepime dosing.  Per discussion with ID, respiratory cultures now growing rare enterobacter cloacae R to Zosyn in addition to moraxella catarrhalis. Due to condition of patient, will conservatively treat and change back to cefepime for coverage of both moraxella and enterobacter at this time.    Plan: Continue cefepime 2g IV every 8 hours F/u renal function (quadriplegic), clinical response, duration of therapy  Height: 5' 10.87" (180 cm) Weight: 111 kg (244 lb 11.4 oz) IBW/kg (Calculated) : 75  Temp (24hrs), Avg:97.9 F (36.6 C), Min:97.7 F (36.5 C), Max:98.1 F (36.7 C)  Recent Labs  Lab 04/24/21 0515 04/30/21 0531  WBC 8.2 11.6*  CREATININE 0.61 0.56*     Estimated Creatinine Clearance: 139.7 mL/min (A) (by C-G formula based on SCr of 0.56 mg/dL (L)).    No Known Allergies  Antimicrobials this admission: Cefepime 10/11 > 10/13, 10/13 >> Unasyn 10/13  Microbiology results: 10/11 trach aspirate cx: MORAXELLA CATARRHALIS, RARE ENTEROBACTER CLOACAE   Thank you for allowing [place] to participate in this patients care. Signe Colt, PharmD 04/30/2021 2:52 PM  **Pharmacist phone directory can be found on [email or web address] listed under [facility]**

## 2021-04-30 NOTE — Progress Notes (Signed)
Physical Therapy Wound Treatment Patient Details  Name: Luke Wright MRN: [id number] Date of Birth: [birth year]  Today's Date: 04/30/2021 PT Individual Time: 0903-0941 PT Individual Time Calculation (min): 38 min   Subjective  Subjective: Pt pleasant and agreeable to hydrotherapy Patient and Family Stated Goals: None stated Date of Onset:  (Unknown) Prior Treatments: Dressing changes; Santyl  Pain Score: No reports of pain   Wound Assessment  Pressure Injury 04/23/21 Buttocks Left Unstageable - Full thickness tissue loss in which the base of the injury is covered by slough (yellow, tan, gray, green or brown) and/or eschar (tan, brown or black) in the wound bed. Unstageable to left buttock wit (Active)  Dressing Type ABD;Barrier Film (skin prep);Gauze (Comment);Moist to moist;Santyl 04/30/21 0948  Dressing Changed;Clean;Dry;Intact 04/30/21 0948  Dressing Change Frequency Daily 04/30/21 0948  State of Healing Early/partial granulation 04/30/21 0948  Site / Wound Assessment Yellow;Brown;Pink 04/30/21 0948  % Wound base Red or Granulating 10% 04/30/21 0948  % Wound base Yellow/Fibrinous Exudate 85% 04/30/21 0948  % Wound base Black/Eschar 5% 04/30/21 0948  % Wound base Other/Granulation Tissue (Comment) 0% 04/30/21 0948  Peri-wound Assessment Intact 04/30/21 0948  Wound Length (cm) 7 cm 04/26/21 1430  Wound Width (cm) 4 cm 04/26/21 1430  Wound Depth (cm) 0.1 cm 04/26/21 1430  Wound Surface Area (cm^2) 28 cm^2 04/26/21 1430  Wound Volume (cm^3) 2.8 cm^3 04/26/21 1430  Tunneling (cm) 0 04/26/21 1430  Undermining (cm) 0 04/26/21 1430  Margins Unattached edges (unapproximated) 04/30/21 0948  Drainage Amount Minimal 04/30/21 0948  Drainage Description Serosanguineous 04/30/21 0948  Treatment Debridement (Selective);Hydrotherapy (Pulse lavage);Packing (Saline gauze) 04/30/21 0948      Hydrotherapy Pulsed lavage therapy - wound location: L buttock Pulsed Lavage with Suction (psi): 12  psi Pulsed Lavage with Suction - Normal Saline Used: 1000 mL Pulsed Lavage Tip: Tip with splash shield Selective Debridement Selective Debridement - Location: L buttock Selective Debridement - Tools Used: Forceps;Scissors;Scalpel Selective Debridement - Tissue Removed: Eschar   Wound Assessment and Plan  Wound Therapy - Assess/Plan/Recommendations Wound Therapy - Clinical Statement: Progressing with debridement. Overall wound bed appears improved. This patient will benefit from continued hydrotherapy for selective removal of unviable tissue, to decrease bioburden, and promote wound bed healing. Wound Therapy - Functional Problem List: SCI - decreased tolerance for position changes; immobility Factors Delaying/Impairing Wound Healing: Immobility;Altered sensation Hydrotherapy Plan: Debridement;Patient/family education;Dressing change;Pulsatile lavage with suction Wound Therapy - Frequency: 6X / week Wound Therapy - Follow Up Recommendations: dressing changes by RN  Wound Therapy Goals- Improve the function of patient's integumentary system by progressing the wound(s) through the phases of wound healing (inflammation - proliferation - remodeling) by: Decrease Necrotic Tissue to: 20% Decrease Necrotic Tissue - Progress: Progressing toward goal Increase Granulation Tissue to: 80% Increase Granulation Tissue - Progress: Progressing toward goal Improve Drainage Characteristics: Min;Serous Improve Drainage Characteristics - Progress: Progressing toward goal Goals/treatment plan/discharge plan were made with and agreed upon by patient/family: Yes Time For Goal Achievement: 7 days Wound Therapy - Potential for Goals: Good  Goals will be updated until maximal potential achieved or discharge criteria met.  Discharge criteria: when goals achieved, discharge from hospital, MD decision/surgical intervention, no progress towards goals, refusal/missing three consecutive treatments without notification  or medical reason.  GP     Thelma Comp 04/30/2021, 9:52 AM  Rolinda Roan, PT, DPT Acute Rehabilitation Services Pager: [phone] Office: [phone]

## 2021-04-30 NOTE — Progress Notes (Signed)
Patient ID: Luke Wright, male   DOB: [birth year], 50 y.o.   MRN: [id number]  SW sent demo sheet to [facility] for upcoming power w/c evaluation.   [name], MSW, LCSWA Office: [phone] Cell: [phone] Fax: [phone]
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
## 2021-04-30 NOTE — Progress Notes (Signed)
Occupational Therapy Session Note  Patient Details  Name: Luke Wright MRN: [id number] Date of Birth: 12-06-1970  Today's Date: 04/30/2021 OT Individual Time: 1000-1100 OT Individual Time Calculation (min): 60 min    Short Term Goals: Week 1:  OT Short Term Goal 1 (Week 1): Pt will self feed with AE PRN with Max A OT Short Term Goal 2 (Week 1): Pt will brush teeth with AE with Max A OT Short Term Goal 3 (Week 1): Pt will roll L/R with 1 assist to decrease caregiver burden OT Short Term Goal 4 (Week 1): Pt will tolerate wearing B elbow splints for 2 hours with no redness/irritation  Skilled Therapeutic Interventions/Progress Updates:    Pt resting in bed upon arrival with RN present performing suction. OT intervention with focus on bed mobility and BUE PROM/AAROM. Pt reports pain as noted below. Rolling R/L in bed with tot A+2 to facilitate placement of sling for MaxiLift. Pt directs care with min verbal cues. O2 sats mid 80% with rolling but generally remained in 90-92% range on O2 (8L via trach). Pt requires significant positioning with pillows once in TIS w/c. Pt with slight lateral lean to Lt. Pt remained in w/c. Soft call bell placed where pt can activate with head. Pt demonstrated ability to activate soft call bell.    Therapy Documentation Precautions:  Precautions Precautions: Fall Precaution Comments: C5 SCI, Asia A, trach Restrictions Weight Bearing Restrictions: No   Pain:  Pt reports increased pain in BUE (triceps area) with movement/PROM; repositioned     Therapy/Group: Individual Therapy  Rich Brave 04/30/2021, 12:33 PM

## 2021-04-30 NOTE — Progress Notes (Signed)
Physical Therapy Session Note  Patient Details  Name: Luke Wright MRN: [id number] Date of Birth: 05/20/1971  Today's Date: 04/30/2021 PT Individual Time: 1300-1400 PT Individual Time Calculation (min): 60 min   Short Term Goals: Week 1:  PT Short Term Goal 1 (Week 1): Pt will tolerate sitting up in chair x 1 hour PT Short Term Goal 2 (Week 1): Pt will initiate power wheelchair mobility PT Short Term Goal 3 (Week 1): Pt will recall pressure relief schedule and pressure relief techniques  Skilled Therapeutic Interventions/Progress Updates:    Pt received seated in TIS chair in room, agreeable to PT session. No complaints of pain this PM. Pt taken outdoors via TIS chair for improved mood and therapy buy-in. Pt reports he is an "outdoors" person and loves spending most of his time outdoors. Pt on 8L O2 FiO2 40% via trach collar during session, SpO2 remains at 90% and higher. Discussed use of PWC and that setup pt with PWC evaluation for tomorrow. Pt excited by idea of having increased independence via use of PWC. Discussed how pt can utilize chair to perform pressure relief as well as mobility. Pt reiterates that his daughter has a ramp in place as well as a handicap accessible van. Pt requests to return to bed at end of session due to fatigue. Maxi move transfer TIS chair to bed with assist x 2. Rolling L/R with assist x 2 for removal of sling. Pt left semi-reclined in bed with needs in reach, back on room O2 at 10 L O2 FiO2 40% via trach collar.  Therapy Documentation Precautions:  Precautions Precautions: Fall Precaution Comments: C5 SCI, Asia A, trach Restrictions Weight Bearing Restrictions: No    Therapy/Group: Individual Therapy   Peter Congo, PT, DPT, CSRS  04/30/2021, 3:58 PM
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
## 2021-05-01 LAB — CBC WITH DIFFERENTIAL/PLATELET
Abs Immature Granulocytes: 0.13 10*3/uL — ABNORMAL HIGH (ref 0.00–0.07)
Basophils Absolute: 0.1 10*3/uL (ref 0.0–0.1)
Basophils Relative: 1 %
Eosinophils Absolute: 0.4 10*3/uL (ref 0.0–0.5)
Eosinophils Relative: 3 %
HCT: 34.8 % — ABNORMAL LOW (ref 39.0–52.0)
Hemoglobin: 10.9 g/dL — ABNORMAL LOW (ref 13.0–17.0)
Immature Granulocytes: 1 %
Lymphocytes Relative: 25 %
Lymphs Abs: 2.7 10*3/uL (ref 0.7–4.0)
MCH: 26.5 pg (ref 26.0–34.0)
MCHC: 31.3 g/dL (ref 30.0–36.0)
MCV: 84.5 fL (ref 80.0–100.0)
Monocytes Absolute: 0.6 10*3/uL (ref 0.1–1.0)
Monocytes Relative: 6 %
Neutro Abs: 7.1 10*3/uL (ref 1.7–7.7)
Neutrophils Relative %: 64 %
Platelets: 435 10*3/uL — ABNORMAL HIGH (ref 150–400)
RBC: 4.12 MIL/uL — ABNORMAL LOW (ref 4.22–5.81)
RDW: 16.2 % — ABNORMAL HIGH (ref 11.5–15.5)
WBC: 11 10*3/uL — ABNORMAL HIGH (ref 4.0–10.5)
nRBC: 0 % (ref 0.0–0.2)

## 2021-05-01 LAB — BASIC METABOLIC PANEL
Anion gap: 9 (ref 5–15)
BUN: 8 mg/dL (ref 6–20)
CO2: 26 mmol/L (ref 22–32)
Calcium: 9.4 mg/dL (ref 8.9–10.3)
Chloride: 104 mmol/L (ref 98–111)
Creatinine, Ser: 0.6 mg/dL — ABNORMAL LOW (ref 0.61–1.24)
GFR, Estimated: >60 mL/min (ref >60)
Glucose, Bld: 92 mg/dL (ref 70–99)
Potassium: 4 mmol/L (ref 3.5–5.1)
Sodium: 139 mmol/L (ref 135–145)

## 2021-05-01 LAB — URINE CULTURE: Culture: NO GROWTH

## 2021-05-01 MED ORDER — MIRTAZAPINE 15 MG PO TABS
15.0000 mg | ORAL_TABLET | Freq: Every day | ORAL | Status: DC
  Administered 2021-05-01: 15 mg via ORAL
  Filled 2021-05-01: qty 1

## 2021-05-01 MED ORDER — SODIUM CHLORIDE 3 % IN NEBU
4.0000 mL | INHALATION_SOLUTION | Freq: Four times a day (QID) | RESPIRATORY_TRACT | Status: DC
  Administered 2021-05-01 – 2021-05-03 (×7): 4 mL via RESPIRATORY_TRACT
  Filled 2021-05-01 (×11): qty 4

## 2021-05-01 NOTE — Patient Care Conference (Signed)
Inpatient RehabilitationTeam Conference and Plan of Care Update Date: 05/01/2021   Time: 11:11 AM    Patient Name: Luke Wright      Medical Record Number: [id number]  Date of Birth: 01/22/1971 Sex: Male         Room/Bed: 4W20C/4W20C-01 Payor Info: Payor: [employer]vertising copy[employer] MEDICARE / Plan: Suan Halter DUAL COMPLETE / Product Type: *No Product type* /    Admit Date/Time:  04/23/2021  2:17 PM  Primary Diagnosis:  Quadriplegia, unspecified [facility])  Hospital Problems: Principal Problem:   Quadriplegia, unspecified (HCC) Active Problems:   Pressure injury of skin   Pressure injury of sacral region, unstageable (HCC)   Hypoalbuminemia due to protein-calorie malnutrition (HCC)   Neurogenic bowel   Acute blood loss anemia   Essential hypertension   Dysphagia    Expected Discharge Date: Expected Discharge Date: 05/17/21  Team Members Present: Physician leading conference: Dr. Genice Rouge Social Worker Present: [name], LCSWA Nurse Present: Kennyth Arnold, RN PT Present: Peter Congo, PT OT Present: Ardis Rowan, COTA;Jennifer Katrinka Blazing, OT SLP Present: Gerda Diss, SLP PPS Coordinator present : Fae Pippin, SLP     Current Status/Progress Goal Weekly Team Focus  Bowel/Bladder   incontinent, bowel/bladder program  family members will learn how to cath patient  I&O cath, bowel program   Swallow/Nutrition/ Hydration   Dys 3, thin liquids, sup A  Mod I  Regular PO trials/trial tray   ADL's   BADLs-dependent; BUE elbow contracture-Rt>Lt; some movement at shoulders, biceps, and trace in Rt wrist  Max/total bathe/dress, Mod self feed/oral hygiene with AE to be downgraded  education; bed mobility, sitting balance   Mobility   total A x 2 rolling, total A x 2 supine to/from sit, transfer via maxi move dependently due to sacral and buttock wounds, PWC eval today  dependent for transfers, mod I PWC, pt indep to direct family/caregivers with transfers and pressure relief   w/c eval, OOB tolerance, SCI edu   Communication   Tolerating PMSV during therapy with O2 ranging 89-93% and vitals WNL. Pt exhibits clear vocal quality but decreased vocal intensity. Improvement with secretions (per ST session on 10/17) sup-to-min A  mod I  vocal intensity with PMSV, increasing tolerance during therapy   Safety/Cognition/ Behavioral Observations  WFL per informal assessment         Pain   pain in shoulders and upper arms  <3  assess q 4 hr and prn   Skin   unstageable to sacrum, stage 2 sacrum  have patient rotate sides every 2 hours  assess skin q shift and prn     Discharge Planning:  D/c to home with his dtr who will provide intermittent care since she cares for 6 children under the age of 50 y.o. Fam mtg on 05/03/21 at 10am with pt dtr.   Team Discussion: UA neg. On Maxipime for pulmonary. Higher volumes of urine at night. Receiving Botox today. Want to do Baclofen trial. Will need O2 and humidity at discharge. On bladder/bowel program. Receiving hydro-therapy for sacral wound, stage 2 to side of sacrum. Receiving IV antibiotics. Ted hose need to be taken off at night. Pain patches need to be removed from body at appropriate times. Working on Research officer, political party. Scheduled family meeting for Thursday.  Patient on target to meet rehab goals: yes, total assist, maxi-move for transfers. In tilt-n-space W/C, needs help repositioning. Getting set-up for W/C eval. Good at directing care. Tolerating PMSV about 45 min. Going to trial regular tray.  *  See Care Plan and progress notes for long and short-term goals.   Revisions to Treatment Plan:   Added hydro-therapy for wound care, adjusting medications.  Teaching Needs: Family education, medication management, pain management, skin/wound care, bowel/bladder program education, repositioning education  Current Barriers to Discharge: Decreased caregiver support, Home enviroment access/layout, Trach, IV antibiotics, Neurogenic  bowel and bladder, Wound care, Lack of/limited family support, Medication compliance, and New oxygen  Possible Resolutions to Barriers: Family education, I&O cath and bowel program education, take ted hose off at night, take pain patches off when ordered, family meeting scheduled, will discuss possible foley.     Medical Summary Current Status: quad- in/out caths- bowel program- still incontinent- unstageable getting hydrotherapy and stage II on buttocks- same;  Barriers to Discharge: Decreased family/caregiver support;IV antibiotics;Home enviroment access/layout;Neurogenic Bowel & Bladder;Incontinence;Trach;New oxygen;Weight;Weight bearing restrictions;Other (comments)  Barriers to Discharge Comments: family meeting 10am Thursday- will discuss possible Foley; severe spasticity Possible Resolutions to [employer] Focus: see if can get ITB trial/pump placed; trach/on maxipime for resp/pneumonia; hydrotherapy for unstageable wound- maximove not slidining board due to wounds; St[employer] coming for electric w/c evaluation- pain in triceps fyi- lidoderm; repleted K+- mild leukocytosis- no sign of fever, ; directs care; SLP- trach- working on PMV- 45-60 minutes with SLP- trial of regular tray; to change to uncuffed trach sometimes soon- d/c 11/3   Continued Need for Acute Rehabilitation Level of Care: The patient requires daily medical management by a physician with specialized training in physical medicine and rehabilitation for the following reasons: Direction of a multidisciplinary physical rehabilitation program to maximize functional independence : Yes Medical management of patient stability for increased activity during participation in an intensive rehabilitation regime.: Yes Analysis of laboratory values and/or radiology reports with any subsequent need for medication adjustment and/or medical intervention. : Yes   I attest that I was present, lead the team conference, and concur with the  assessment and plan of the team.   Tennis Must 05/01/2021, 3:40 PM

## 2021-05-01 NOTE — Progress Notes (Signed)
Patient ID: ANEES VANECEK, male   DOB: December 18, 1970, 50 y.o.   MRN: [id number]  SW met with pt and called pt dtr [name] while in room to provide updates from team conference, and d/c date 11/3. SW confirms family meeting Thursday 10/20 at [place] with medical team. Pt dtr reports she has not heard from [facility].   SW spoke with [employer] to inform on pt d/c date and if they have scheduled when they will do meet and greet. SW asked if he can f/u with his dtr to discuss meeting tomorrow since she will be here for family edu.  Loralee Pacas, MSW, [place] Office: [phone] Cell: [phone] Fax: [phone]

## 2021-05-01 NOTE — Progress Notes (Signed)
Pt was pre-oxygenated prior to suctioning to 98% fio2 due to history of desaturation during suctioning. On 98% while suctioning, pt Spo2 went down to 85% but recovered fairly quickly.
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
## 2021-05-01 NOTE — Progress Notes (Signed)
Occupational Therapy Weekly Progress Note  Patient Details  Name: Luke Wright MRN: [id number] Date of Birth: 02-01-1971  Beginning of progress report period: April 24, 2021 End of progress report period: May 01, 2021  Patient has met 0 of 4 short term goals.  Pt progress has been slow since admission. Pt currently dependent for BADLs and bed mobility. Transfers with MaxiSky. Pt with significant Bil elbow contractures. Pt with movement at shoulder (elevation), biceps (slight elbow flexion), and BUE horizontal abduction/adduction. Pt dependent for self feeding. Rolling in bed with tot A+2. Pt able to direct care regarding positioning. Pt with Rt lean when seated in w/c and requiring positioning with pillows. Family has not been present for therapy but family conference scheduled for 10/20.   Patient continues to demonstrate the following deficits: muscle weakness, muscle joint tightness, muscle paralysis, and difficulty with secretions , decreased cardiorespiratoy endurance, impaired timing and sequencing, abnormal tone, and unbalanced muscle activation, and decreased sitting balance and decr knowledge of SCI and related topics; decr bowel and bladder function  and therefore will continue to benefit from skilled OT intervention to enhance overall performance with BADL and Reduce care partner burden.  Patient progressing toward long term goals..  Continue plan of care.  OT Short Term Goals Week 1:  OT Short Term Goal 1 (Week 1): Pt will self feed with AE PRN with Max A OT Short Term Goal 1 - Progress (Week 1): Progressing toward goal OT Short Term Goal 2 (Week 1): Pt will brush teeth with AE with Max A OT Short Term Goal 2 - Progress (Week 1): Progressing toward goal OT Short Term Goal 3 (Week 1): Pt will roll L/R with 1 assist to decrease caregiver burden OT Short Term Goal 3 - Progress (Week 1): Progressing toward goal OT Short Term Goal 4 (Week 1): Pt will tolerate wearing B elbow  splints for 2 hours with no redness/irritation OT Short Term Goal 4 - Progress (Week 1): Progressing toward goal Week 2:  OT Short Term Goal 1 (Week 2): Pt will self feed with AE PRN with Max A OT Short Term Goal 2 (Week 2): Pt will brush teeth with AE with Max A OT Short Term Goal 3 (Week 2): Pt will roll L/R with 1 assist to decrease caregiver burden OT Short Term Goal 4 (Week 2): Pt will tolerate wearing B elbow splints for 2 hours with no redness/irritation   [name] 05/01/2021, 2:31 PM

## 2021-05-01 NOTE — Progress Notes (Signed)
Patient verbalized frustration with this nurse about constantly being woken up in the middle of the night by nursing. Nurse explained I&O cath orders and the importance of cathing on time. Patient obviously upset, but understands reasoning.

## 2021-05-01 NOTE — Progress Notes (Signed)
Botox Injection for spasticity of upper extremity using needle EMG guidance Indication: spastic tetraplegia   Dilution: 100 Units/ml        Total Units Injected: 400 Indication: Severe spasticity which interferes with ADL,mobility and/or  hygiene and is unresponsive to medication management and other conservative care Informed consent was obtained after describing risks and benefits of the procedure with the patient. This includes bleeding, bruising, infection, excessive weakness, or medication side effects.    Needle: 10mm injectable monopolar needle electrode    Number of units per muscle:   Biceps 125 units 3 access points RUE, 125 units 3 access points LUE Brachioradialis 75 units 2 access points RUE, 75 units 2 access points LUE   All injections were done after muscle palpation and obtaining  negative drawback for blood. The patient tolerated the procedure well.    Botox: Lot [id number], exp 4/24 Lot [id number], exp 5/24 Lot [id number], exp 5/24 Lot [id number], exp 5/24

## 2021-05-01 NOTE — Progress Notes (Signed)
Occupational Therapy Session Note  Patient Details  Name: Luke Wright MRN: [id number] Date of Birth: 27-Nov-1970  Today's Date: 05/01/2021 OT Individual Time: 1000-1100 OT Individual Time Calculation (min): 60 min    Short Term Goals: Week 1:  OT Short Term Goal 1 (Week 1): Pt will self feed with AE PRN with Max A OT Short Term Goal 2 (Week 1): Pt will brush teeth with AE with Max A OT Short Term Goal 3 (Week 1): Pt will roll L/R with 1 assist to decrease caregiver burden OT Short Term Goal 4 (Week 1): Pt will tolerate wearing B elbow splints for 2 hours with no redness/irritation  Skilled Therapeutic Interventions/Progress Updates:    Pt resting in bed upon arrival. O2 sats 94% on 8L FiO2 40%. OT intervention with focus on bed mobility, OOB tolerance, BUE PROM, education, and safety awareness. Tot A+2 for rolling in bed to place sling for MaxiSky tranfser to TIS w/c. Use of pillows for positioning in w/c with support Rt upper trunk to facilitate pt positioned at midline. BUE positioned on pillows. Pt transported to Day Room to look out window during BUE PROM/stretching. O2 sats>90% on 10L FiO2 40%. Pt returned to room and remained in w/c. Pt able to activate soft call bell.  O2 Flow Rate (L/min): 10 L/min FiO2 (%): 40 %  Therapy Documentation Precautions:  Precautions Precautions: Fall Precaution Comments: C5 SCI, [place] A, trach Restrictions Weight Bearing Restrictions: No Pain:  Pt c/o ongoing BUE pain (posterior upper arm); repositioned   Therapy/Group: Individual Therapy  Rich Brave 05/01/2021, 2:17 PM

## 2021-05-01 NOTE — Progress Notes (Signed)
Physical Therapy Weekly Progress Note  Patient Details  Name: Luke Wright MRN: [id number] Date of Birth: September 12, 1970  Beginning of progress report period: April 24, 2021 End of progress report period: May 01, 2021  Today's Date: 05/01/2021 PT Individual Time: 1145-1200; 7829-5621 PT Individual Time Calculation (min): 15 min and 85 min PT Missed Time: 15 min Missed Time Reason: Unavailable (patient with respiratory therapy and then receiving Botox injection from MD)  Patient has met 1 of 3 short term goals.  Pt is making slow but steady progress towards therapy goals. He currently remains total A x 2 for rolling in bed, supine to/from sit, and is dependent for transfers via use of a lift. He has been unable to initiate [place] mobility at this point due to need for specialty equipment. He completed a seating evaluation this week and will receive a loaner wheelchair with a head array within the next few days in order to initiate power wheelchair mobility and increase independence with pressure relief. Pt has been able to tolerate sitting up in a TIS chair OOB for several hours at a time but still requires ongoing education regarding importance of pressure relief and pressure relief techniques. Pt remains very motivated and exhibits great participation in therapy sessions. Pt is improving in his ability to direct caregivers in how to assist him as well.  Patient continues to demonstrate the following deficits muscle weakness, muscle joint tightness, and muscle paralysis, decreased cardiorespiratoy endurance and decreased oxygen support, abnormal tone, unbalanced muscle activation, and decreased coordination, and decreased sitting balance, decreased postural control, and decreased balance strategies and therefore will continue to benefit from skilled PT intervention to increase functional independence with mobility.  Patient progressing toward long term goals..  Continue plan of care.  PT Short  Term Goals Week 1:  PT Short Term Goal 1 (Week 1): Pt will tolerate sitting up in chair x 1 hour PT Short Term Goal 1 - Progress (Week 1): Met PT Short Term Goal 2 (Week 1): Pt will initiate power wheelchair mobility PT Short Term Goal 2 - Progress (Week 1): Progressing toward goal PT Short Term Goal 3 (Week 1): Pt will recall pressure relief schedule and pressure relief techniques PT Short Term Goal 3 - Progress (Week 1): Progressing toward goal Week 2:  PT Short Term Goal 1 (Week 2): Pt will initiate power wheelchair mobility PT Short Term Goal 2 (Week 2): Pt will recall pressure relief schedule and direct caregivers in assisting him and/or performing with min cueing via [place] PT Short Term Goal 3 (Week 2): Pt family will initiate hands-on family education training  Skilled Therapeutic Interventions/Progress Updates:    Session 1: Attempted to see patient at scheduled time of 11:00, pt receiving care from respiratory therapist. Once respiratory therapist finished MD present for botox injections. Pt missed 15 minutes of scheduled therapy session due to receiving care from other providers. Pt received semi-reclined in TIS chair in room, agreeable to PT session. Pt reports pain in BUE at rest, just received botox treatment and premedicated prior to start of therapy session. Session focus on assisting pt back to bed so that nursing can complete I/O cathing. Maxi move transfer TIS to bed dependently. Rolling L/R with total A for removal of sling. Pt left semi-reclined in bed with needs in reach at end of session.  Session 2: Pt received seated in bed, agreeable to PT session. Pt has some pain in BUE with mobility, no other complaints of pain. Rolling L/R  with total A x 2 for placement of maxi move sling. Pt found to be incontinent of bowel. Pt is dependent for pericare and brief change. Pt's wound dressing also soiled, nursing notified at end of session so she can change dressing. Education with patient  regarding importance of bowel program and goal of bowel program to decrease bowel accidents. Pt reports he is not comfortable with idea of bowel program currently but is understanding of purpose, would benefit from further education. Maxi move transfer to [place] chair. Deatra Ina, [employer] present for power wheelchair seating evaluation. Pt able to participate in discussion regarding his lifestyle, goals with PWC, etc. Pt to trial head array initially with [place], loaner chair to arrive 05/04/21. Maxi move transfer back to bed. Pt left supine in bed with needs in reach, nursing aware of dressing needing to be changed. Pt on 8-10L O2 via trach collar at FiO2 40% throughout session, SpO2 remains at 90% (+).  Therapy Documentation Precautions:  Precautions Precautions: Fall Precaution Comments: C5 SCI, [place] A, trach Restrictions Weight Bearing Restrictions: No         Therapy/Group: Individual Therapy   Excell Seltzer, PT, DPT, CSRS 05/01/2021, 5:19 PM

## 2021-05-01 NOTE — Progress Notes (Signed)
Speech Language Pathology Weekly Progress and Session Note  Patient Details  Name: Luke Wright MRN: [id number] Date of Birth: 11-01-1970  Beginning of progress report period: April 23, 2021 End of progress report period: May 01, 2021  Today's Date: 05/01/2021 SLP Individual Time: 7062-3762 SLP Individual Time Calculation (min): 25 min  Short Term Goals: Week 1: SLP Short Term Goal 1 (Week 1): Patient will consume current diet with Mod I for use of swallowing compensatory strategies. SLP Short Term Goal 1 - Progress (Week 1): Met SLP Short Term Goal 2 (Week 1): Patient will demonstrate efficient mastication and complete oral clearance with trials of regular textures without overt s/s of aspiration over 2 sessions with Mod I prior to upgrade. SLP Short Term Goal 2 - Progress (Week 1): Met SLP Short Term Goal 3 (Week 1): Patient will participate in ongoing cognitive assessment to assess higher-level cognitive tasks. SLP Short Term Goal 3 - Progress (Week 1): Met SLP Short Term Goal 4 (Week 1): Patient will demonstrate 90% speech intelligibility at the sentence level with supervision verbal cus for use of swallowing compensatory strategies. SLP Short Term Goal 4 - Progress (Week 1): Met SLP Short Term Goal 5 (Week 1): Patient will wear PMSV without O2 saturations dropping below 85% and without reports/signs of distress throughout an entirety of a session. SLP Short Term Goal 5 - Progress (Week 1): Met    New Short Term Goals: Week 2: SLP Short Term Goal 1 (Week 2): Patient will demonstrate 90% speech intelligibility at the sentence level with Mod I for use of speech intelligibility strategies. SLP Short Term Goal 2 (Week 2): Patient will wear PMSV without O2 saturations dropping below 90% and without reports/signs of distress throughout an entirety of a session. SLP Short Term Goal 3 (Week 2): Caregiver education will be provided to the patient's daugher so that she can  independently donn/doff PMSV SLP Short Term Goal 4 (Week 2): Patient will consume current diet of regular textures with thin liquids without overt s/s of aspiration with Mod I for use of swallowing compensatory strategies.  Weekly Progress Updates: Patient has made excellent gains and has met 4 of 4 STGs this reporting period. Currently, patient is wearing the PMSV with full staff supervision with vitals remaining above 85%.  Supervision-Min A verbal cues are needed for use of speech intelligibility strategies to achieve ~90% intelligibility at the sentence level. Patient is consuming regular textures with thin liquids with minimal overt s/s of aspiration and is Mod I for use of swallowing compensatory strategies. Cognitive functioning appears [facility] for tasks assessed. Patient and family education ongoing. Patient would benefit from continued skilled SLP intervention to maximize his speech and swallowing function prior to discharge.       Intensity: Minumum of 1-2 x/day, 30 to 90 minutes Frequency: 1 to 3 out of 7 days Duration/Length of Stay: 11/3 but may be shorter for SLP Treatment/Interventions: Dysphagia/aspiration precaution training;Speech/Language facilitation;Therapeutic Activities;Environmental controls;Cueing hierarchy;Functional tasks;Patient/family education   Daily Session  Skilled Therapeutic Interventions:  Skilled treatment session focused on communication and dysphagia goals. SLP donned PMSV upon arrival. Patient's vitals remained WFL throughout session but patient required overall supervision level verbal cues for use of an increased vocal intensity to achieve ~90% intelligibility at the sentence level. SLP facilitated session by providing skilled observation with trial tray of regular textures with thin liquids. Patient demonstrated efficient mastication with complete oral clearance without overt s/s of aspiration. Therefore, recommend patient upgrade to regular textures. Patient  left upright in bed with soft touch bell within reach and PMSV removed. Continue with current plan of care.     Pain No/Denies Pain   Therapy/Group: Individual Therapy  Talor Cheema 05/01/2021, 1:38 PM

## 2021-05-01 NOTE — Progress Notes (Signed)
Physical Therapy Wound Treatment Patient Details  Name: EDELMIRO INNOCENT MRN: [id number] Date of Birth: 1971/07/08  Today's Date: 05/01/2021 PT Individual Time: 0927-1000 PT Individual Time Calculation (min): 33 min   Subjective  Subjective: Pt pleasant and agreeable to hydrotherapy Patient and Family Stated Goals: None stated Date of Onset:  (Unknown) Prior Treatments: Dressing changes; Santyl  Pain Score: Pain Score: 0-No pain  Wound Assessment  Pressure Injury 04/23/21 Buttocks Left Unstageable - Full thickness tissue loss in which the base of the injury is covered by slough (yellow, tan, gray, green or brown) and/or eschar (tan, brown or black) in the wound bed. Unstageable to left buttock wit (Active)  Dressing Type ABD;Barrier Film (skin prep);Gauze (Comment);Moist to moist;Santyl 05/01/21 1148  Dressing Changed;Clean;Dry;Intact 05/01/21 1148  Dressing Change Frequency Daily 05/01/21 1148  State of Healing Early/partial granulation 05/01/21 1148  Site / Wound Assessment Pink;Yellow 05/01/21 1148  % Wound base Red or Granulating 40% 05/01/21 1148  % Wound base Yellow/Fibrinous Exudate 60% 05/01/21 1148  % Wound base Black/Eschar 0% 05/01/21 1148  % Wound base Other/Granulation Tissue (Comment) 0% 05/01/21 1148  Peri-wound Assessment Intact 05/01/21 1148  Wound Length (cm) 7 cm 04/26/21 1430  Wound Width (cm) 4 cm 04/26/21 1430  Wound Depth (cm) 0.1 cm 04/26/21 1430  Wound Surface Area (cm^2) 28 cm^2 04/26/21 1430  Wound Volume (cm^3) 2.8 cm^3 04/26/21 1430  Tunneling (cm) 0 04/26/21 1430  Undermining (cm) 0 04/26/21 1430  Margins Unattached edges (unapproximated) 05/01/21 1148  Drainage Amount Minimal 05/01/21 1148  Drainage Description Serosanguineous 05/01/21 1148  Treatment Debridement (Selective);Hydrotherapy (Pulse lavage);Packing (Saline gauze) 05/01/21 1148      Hydrotherapy Pulsed lavage therapy - wound location: L buttock Pulsed Lavage with Suction (psi): 12  psi Pulsed Lavage with Suction - Normal Saline Used: 1000 mL Pulsed Lavage Tip: Tip with splash shield Selective Debridement Selective Debridement - Location: L buttock Selective Debridement - Tools Used: Forceps;Scissors;Scalpel Selective Debridement - Tissue Removed: Eschar   Wound Assessment and Plan  Wound Therapy - Assess/Plan/Recommendations Wound Therapy - Clinical Statement: Progressing with debridement. Overall wound bed appears improved. This patient will benefit from continued hydrotherapy for selective removal of unviable tissue, to decrease bioburden, and promote wound bed healing. Wound Therapy - Functional Problem List: SCI - decreased tolerance for position changes; immobility Factors Delaying/Impairing Wound Healing: Immobility;Altered sensation Hydrotherapy Plan: Debridement;Patient/family education;Dressing change;Pulsatile lavage with suction Wound Therapy - Frequency: 6X / week Wound Therapy - Follow Up Recommendations: dressing changes by RN  Wound Therapy Goals- Improve the function of patient's integumentary system by progressing the wound(s) through the phases of wound healing (inflammation - proliferation - remodeling) by: Decrease Necrotic Tissue to: 20% Decrease Necrotic Tissue - Progress: Progressing toward goal Increase Granulation Tissue to: 80% Increase Granulation Tissue - Progress: Progressing toward goal Improve Drainage Characteristics: Min;Serous Improve Drainage Characteristics - Progress: Progressing toward goal Goals/treatment plan/discharge plan were made with and agreed upon by patient/family: Yes Time For Goal Achievement: 7 days Wound Therapy - Potential for Goals: Good  Goals will be updated until maximal potential achieved or discharge criteria met.  Discharge criteria: when goals achieved, discharge from hospital, MD decision/surgical intervention, no progress towards goals, refusal/missing three consecutive treatments without notification  or medical reason.  GP     Thelma Comp 05/01/2021, 11:54 AM  Rolinda Roan, PT, DPT Acute Rehabilitation Services Pager: [phone] Office: [phone]

## 2021-05-02 DIAGNOSIS — J189 Pneumonia, unspecified organism: Secondary | ICD-10-CM | POA: Diagnosis not present

## 2021-05-02 DIAGNOSIS — Y95 Nosocomial condition: Secondary | ICD-10-CM | POA: Diagnosis not present

## 2021-05-02 DIAGNOSIS — G8250 Quadriplegia, unspecified: Secondary | ICD-10-CM | POA: Diagnosis not present

## 2021-05-02 MED ORDER — MIRTAZAPINE 15 MG PO TABS
7.5000 mg | ORAL_TABLET | Freq: Every day | ORAL | Status: DC
  Administered 2021-05-02 – 2021-05-06 (×5): 7.5 mg via ORAL
  Filled 2021-05-02 (×5): qty 1

## 2021-05-02 MED ORDER — ALBUTEROL SULFATE (2.5 MG/3ML) 0.083% IN NEBU
2.5000 mg | INHALATION_SOLUTION | Freq: Four times a day (QID) | RESPIRATORY_TRACT | Status: DC
  Administered 2021-05-02 – 2021-05-05 (×9): 2.5 mg via RESPIRATORY_TRACT
  Filled 2021-05-02 (×11): qty 3

## 2021-05-02 NOTE — Progress Notes (Signed)
Physical Therapy Session Note  Patient Details  Name: Luke Wright MRN: [id number] Date of Birth: Jan 07, 1971  Today's Date: 05/02/2021 PT Individual Time: 1300-1425 PT Individual Time Calculation (min): 85 min   Short Term Goals: Week 2:  PT Short Term Goal 1 (Week 2): Pt will initiate power wheelchair mobility PT Short Term Goal 2 (Week 2): Pt will recall pressure relief schedule and direct caregivers in assisting him and/or performing with min cueing via [place] PT Short Term Goal 3 (Week 2): Pt family will initiate hands-on family education training  Skilled Therapeutic Interventions/Progress Updates: Pt presented in TIS agreeable to therapy. Pt noted to be incontinent of bladder prior to leaving room. Respiratory therapy arrived for deep suctioning and room was set up for transfer while suctioning performed. Performed mechanical lift transfer to bed and performed dependent rolling L/R  to remove brief and dependent for peri-care. Pt noted to have another void once completed notified nsg and noted time for in/out cath. While awaiting nsg for cath PTA and PT performed manual stretching of BUE with some increased range at elbow noted from previous time with this therapist. PTA discussed with nsg use of arm splints in evening which nsg was unaware of. After cath NT assisted with rolling L/R to place brief and remove maxi-sky pad. PTA then donned arm splints (placed inside elbow to maintain static stretch at elbow) and demonstrated to nsg positioning and to convey to night shift to apply at night (PTA also made sign). Pt was repositioned to comfort at end of session and left with soft touch bell positioned and pt was able to activate with current needs met.      Therapy Documentation Precautions:  Precautions Precautions: Fall Precaution Comments: C5 SCI, Asia A, trach Restrictions Weight Bearing Restrictions: No General:   Vital Signs: Therapy Vitals Pulse Rate: 84 Resp: 16 Patient  Position (if appropriate): Lying Oxygen Therapy SpO2: 95 % O2 Device: Tracheostomy Collar O2 Flow Rate (L/min): 10 L/min FiO2 (%): 40 % Pain:   Mobility:   Locomotion :    Trunk/Postural Assessment :    Balance:   Exercises:   Other Treatments:      Therapy/Group: Individual Therapy  Caitlin Hillmer 05/02/2021, 3:52 PM

## 2021-05-02 NOTE — Progress Notes (Signed)
Physical Therapy Session Note  Patient Details  Name: Luke Wright MRN: [id number] Date of Birth: 04/09/1971  Today's Date: 05/02/2021 PT Individual Time: 1443-1540 PT Individual Time Calculation (min): 38 min   Short Term Goals: Week 2:  PT Short Term Goal 1 (Week 2): Pt will initiate power wheelchair mobility PT Short Term Goal 2 (Week 2): Pt will recall pressure relief schedule and direct caregivers in assisting him and/or performing with min cueing via PWC PT Short Term Goal 3 (Week 2): Pt family will initiate hands-on family education training  Skilled Therapeutic Interventions/Progress Updates:    Pt received supine in bed and agreeable to therapy session despite reporting fatigue. Received and maintained on 8L of O2 with 40% FiO2 via trach collar throughout session. Pt already wearing B LE thigh high TED hose. Assessed supine vitals: BP 139/107 (MAP 117), HR 82bpm, SpO2 95% Supine>sitting R EOB, via logroll technique, with +2 total assist for B LE management and trunk upright - therapist lowered bedrails to allow for UE movements during this transition due to shoulder abduction and elbow flexion hypertonia/contractures - deflated air mattress for pt safety sitting EOB. Requires total assist for trunk control in sitting and +2 total assist to scoot hips forward towards EOB. Reassessed vitals and BP was WNL (~120s/~80s). Therapy session focused on trunk control/sitting balance. Requires total assist to achieve "balance point" and then when assist removed pt able to sustain ~1-5seconds inconsistently. Educated pt on use of shoulder and neck movements to assist with trunk control. Progressed to performing "modified sit-ups" from ~5 degrees trunk reclined position x8 reps with cuing to perform neck flexion along with shoulder protraction quickly to assist with bringing trunk forward - requires total assist to prevent anterior LOB when pt comes forward and 1x pt able to bring himself forward to  his "balancing point" and hold ~2 seconds. Sit>supine via reverse logroll technique with +2 total assist. Supine scoot towards HOB +2 dependent assist. Pt left supine in bed with HOB elevated, B UEs therapeutically positioned on pillows, B LEs therapeutically positioned to float heels, lines intact, and soft call button in position.   Therapy Documentation Precautions:  Precautions Precautions: Fall Precaution Comments: C5 SCI, Asia A, trach Restrictions Weight Bearing Restrictions: No   Pain:  No reports of pain throughout session.   Therapy/Group: Individual Therapy  Ginny Forth , PT, DPT, NCS, CSRS 05/02/2021, 12:33 PM
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
## 2021-05-02 NOTE — Progress Notes (Signed)
Patient ID: Luke Wright, male   DOB: 1971/03/09, 50 y.o.   MRN: [id number]  SW received phone call from [facility] healthcare to confirm meet and greet with his dtr tomorrow. Reports he will f/u with her to discuss. *Confirms he will meet her tomorrow between 11am-11:30am with 2 other staff. SW faxed clinical notes to [phone]. SW gave permission for entry at front desk.   [name], MSW, LCSWA Office: [phone] Cell: [phone] Fax: [phone]

## 2021-05-02 NOTE — Progress Notes (Signed)
Physical Therapy Wound Treatment Patient Details  Name: Luke Wright MRN: [id number] Date of Birth: 13-Dec-1970  Today's Date: 05/02/2021 PT Individual Time: 0920-0958 PT Individual Time Calculation (min): 38 min   Subjective  Subjective: Pt pleasant and agreeable to hydrotherapy Patient and Family Stated Goals: None stated Date of Onset:  (Unknown) Prior Treatments: Dressing changes; Santyl  Pain Score: Pt does not complain of pain throughout session.   Wound Assessment  Pressure Injury 04/23/21 Buttocks Left Unstageable - Full thickness tissue loss in which the base of the injury is covered by slough (yellow, tan, gray, green or brown) and/or eschar (tan, brown or black) in the wound bed. Unstageable to left buttock wit (Active)  Dressing Type ABD;Barrier Film (skin prep);Gauze (Comment);Moist to moist;Santyl 05/02/21 1150  Dressing Changed;Clean;Dry;Intact 05/02/21 1150  Dressing Change Frequency Daily 05/02/21 1150  State of Healing Early/partial granulation 05/02/21 1150  Site / Wound Assessment Pink;Yellow 05/02/21 1150  % Wound base Red or Granulating 40% 05/02/21 1150  % Wound base Yellow/Fibrinous Exudate 60% 05/02/21 1150  % Wound base Black/Eschar 0% 05/02/21 1150  % Wound base Other/Granulation Tissue (Comment) 0% 05/02/21 1150  Peri-wound Assessment Intact 05/02/21 1150  Wound Length (cm) 7 cm 04/26/21 1430  Wound Width (cm) 4 cm 04/26/21 1430  Wound Depth (cm) 0.1 cm 04/26/21 1430  Wound Surface Area (cm^2) 28 cm^2 04/26/21 1430  Wound Volume (cm^3) 2.8 cm^3 04/26/21 1430  Tunneling (cm) 0 04/26/21 1430  Undermining (cm) 0 04/26/21 1430  Margins Unattached edges (unapproximated) 05/02/21 1150  Drainage Amount Minimal 05/02/21 1150  Drainage Description Serosanguineous 05/02/21 1150  Treatment Debridement (Selective);Hydrotherapy (Pulse lavage);Packing (Saline gauze) 05/02/21 1150      Hydrotherapy Pulsed lavage therapy - wound location: L buttock Pulsed  Lavage with Suction (psi): 12 psi Pulsed Lavage with Suction - Normal Saline Used: 1000 mL Pulsed Lavage Tip: Tip with splash shield Selective Debridement Selective Debridement - Location: L buttock Selective Debridement - Tools Used: Forceps;Scalpel Selective Debridement - Tissue Removed: Yellow unviable tissue   Wound Assessment and Plan  Wound Therapy - Assess/Plan/Recommendations Wound Therapy - Clinical Statement: Bleeding noted with dressing removal. Extra care taken with debridement around this area however bleeding appeared to be controlled by the end of the session. This patient will benefit from continued hydrotherapy for selective removal of unviable tissue, to decrease bioburden, and promote wound bed healing. Wound Therapy - Functional Problem List: SCI - decreased tolerance for position changes; immobility Factors Delaying/Impairing Wound Healing: Immobility;Altered sensation Hydrotherapy Plan: Debridement;Patient/family education;Dressing change;Pulsatile lavage with suction Wound Therapy - Frequency: 6X / week Wound Therapy - Follow Up Recommendations: dressing changes by RN  Wound Therapy Goals- Improve the function of patient's integumentary system by progressing the wound(s) through the phases of wound healing (inflammation - proliferation - remodeling) by: Decrease Necrotic Tissue to: 20% Decrease Necrotic Tissue - Progress: Progressing toward goal Increase Granulation Tissue to: 80% Increase Granulation Tissue - Progress: Progressing toward goal Improve Drainage Characteristics: Min;Serous Improve Drainage Characteristics - Progress: Progressing toward goal Goals/treatment plan/discharge plan were made with and agreed upon by patient/family: Yes Time For Goal Achievement: 7 days Wound Therapy - Potential for Goals: Good  Goals will be updated until maximal potential achieved or discharge criteria met.  Discharge criteria: when goals achieved, discharge from hospital,  MD decision/surgical intervention, no progress towards goals, refusal/missing three consecutive treatments without notification or medical reason.  GP     Thelma Comp 05/02/2021, 11:53 AM  Rolinda Roan, PT, DPT Acute  Rehabilitation Services Pager: [phone] Office: [phone]   

## 2021-05-03 DIAGNOSIS — G8250 Quadriplegia, unspecified: Secondary | ICD-10-CM | POA: Diagnosis not present

## 2021-05-03 MED ORDER — SODIUM CHLORIDE 3 % IN NEBU
4.0000 mL | INHALATION_SOLUTION | Freq: Three times a day (TID) | RESPIRATORY_TRACT | Status: DC
  Administered 2021-05-03 – 2021-05-05 (×5): 4 mL via RESPIRATORY_TRACT
  Filled 2021-05-03 (×8): qty 4

## 2021-05-03 NOTE — Progress Notes (Signed)
Occupational Therapy Session Note  Patient Details  Name: Luke Wright MRN: [id number] Date of Birth: [birth year]  Today's Date: 05/03/2021 OT Co-Treatment Time: 1100-1130 OT Co-Treatment Time Calculation (min): 30 min Total time 60 mins   Short Term Goals: Week 2:  OT Short Term Goal 1 (Week 2): Pt will self feed with AE PRN with Max A OT Short Term Goal 2 (Week 2): Pt will brush teeth with AE with Max A OT Short Term Goal 3 (Week 2): Pt will roll L/R with 1 assist to decrease caregiver burden OT Short Term Goal 4 (Week 2): Pt will tolerate wearing B elbow splints for 2 hours with no redness/irritation  Skilled Therapeutic Interventions/Progress Updates:    Cotx with PT. Focus on bed mobility, sitting balance EOB, OOB tolerance, BUE PROM, and activity tolerance. Thigh [facility] and ACE wraps donned to knees before sitting EOB. BP supine-124/78, seated EOB 87/53 and pt asymptomatic. Pt returned to supine and ACE wraps donned over thighs. Pt returned to sitting EOB and BO 96/67. Pt asymptomatic. Supine<>sit and sitting balance with tot A+2. Pt transferred to w/c with MaxiSky and postioned in w/c with pillows to facilitate correct alignment. Pt able to activate soft call bell with head when placed behind head on pillow.   Therapy Documentation Precautions:  Precautions Precautions: Fall Precaution Comments: C5 SCI, Asia A, trach Restrictions Weight Bearing Restrictions: No Pain: Pain Assessment Pain Scale: 0-10 Pain Score: 0-No pain   Therapy/Group: Co-Treatment  Rich Brave 05/03/2021, 12:18 PM

## 2021-05-03 NOTE — Progress Notes (Signed)
Physical Therapy Session Note Co-tx with COTA  Patient Details  Name: Luke Wright MRN: [id number] Date of Birth: 05-08-1971  Today's Date: 05/03/2021 PT Individual Time: 1130-1200 PT Individual Time Calculation (min): 30 min   Short Term Goals: Week 2:  PT Short Term Goal 1 (Week 2): Pt will initiate power wheelchair mobility PT Short Term Goal 2 (Week 2): Pt will recall pressure relief schedule and direct caregivers in assisting him and/or performing with min cueing via PWC PT Short Term Goal 3 (Week 2): Pt family will initiate hands-on family education training  Skilled Therapeutic Interventions/Progress Updates: Pt presented in bed agreeable to therapy. Nsg just completed with in/out cath after family meeting. Session focused on bed mobility and upright tolerance. PTA and COTA donned TED hose and ace bandages total A. BP checked in supine 124/78 (92) HR 78. Pt transferred to sitting EOB total A x 2 and pt asymptomatic BP checked 87/53 with pt no s/s OH. Pt returned to supine total A x 2 and additional TED hose applied to thigh high level. Pt returned to sitting in same manner as prior and BP assessed 96/67 (78) HR 85. Pt was able to tolerate sitting ~5 minutes without any s/s OH. BP checked prior to returning to bed 78/54 (63) HR 83. Pt returned to supine total A x 2 and performed rolling L/R maxA x 2 for maxi sky placement. Pt then transferred to TIS via maxi sky and positioned with pillows to minimize elbow contracture and maintain good overall positioning of arms. Pt left in TIS with soft touch bell paced behind head and pt able to activate.           Therapy Documentation Precautions:  Precautions Precautions: Fall Precaution Comments: C5 SCI, Asia A, trach Restrictions Weight Bearing Restrictions: No General:   Vital Signs: Therapy Vitals Temp: (!) 97.3 F (36.3 C) Pulse Rate: 86 Resp: 16 BP: 100/69 Patient Position (if appropriate): Sitting Oxygen Therapy SpO2: 96  % O2 Device: Tracheostomy Collar O2 Flow Rate (L/min): 8 L/min FiO2 (%): 35 % Pain:   Mobility:   Locomotion :    Trunk/Postural Assessment :    Balance:   Exercises:   Other Treatments:      Therapy/Group: Individual Therapy  Spyridon Hornstein 05/03/2021, 4:33 PM

## 2021-05-03 NOTE — Progress Notes (Addendum)
Patient ID: [name], male   DOB: 07/04/1971, 50 y.o.   MRN: [id number]  Patient/Family Conference  Patient/family in attendance: Dtr [name]   Staff in attendance: Dr. Berline Chough (attending), Elio Forget (SLP), Ardis Rowan (OT), Rosita Cechalus (PT), [name] (SW)  Main focus: Discuss patient care needs at home; 24/7 care and continued support needed.   Synopsis of information shared: Attending addresses C5 injury and current functional abilities. Discussed pt d/c to home with trach and oxygen.   Barriers/concerns expressed by patient and family: Dtr confirms she can provide care, however, could benefit from continued support from other supports if possible.   Patient/family response: Dtr appeared to be responsive and engaged during meeting.   Follow-up/action plans: SW will continue to work on getting private duty nursing if possible. SW will explore HH options in the event private duty nursing not an option. SW will order DME and pt will be ambulance transport home.  [name], MSW, LCSWA Office: [phone] Cell: [phone] Fax: [phone]

## 2021-05-03 NOTE — Progress Notes (Signed)
Speech Language Pathology Daily Session Note  Patient Details  Name: Luke Wright MRN: [id number] Date of Birth: 01-Dec-1970  Today's Date: 05/03/2021 SLP Individual Time: 1500-1525 SLP Individual Time Calculation (min): 25 min  Short Term Goals: Week 2: SLP Short Term Goal 1 (Week 2): Patient will demonstrate 90% speech intelligibility at the sentence level with Mod I for use of speech intelligibility strategies. SLP Short Term Goal 2 (Week 2): Patient will wear PMSV without O2 saturations dropping below 90% and without reports/signs of distress throughout an entirety of a session. SLP Short Term Goal 3 (Week 2): Caregiver education will be provided to the patient's daugher so that she can independently donn/doff PMSV SLP Short Term Goal 4 (Week 2): Patient will consume current diet of regular textures with thin liquids without overt s/s of aspiration with Mod I for use of swallowing compensatory strategies.  Skilled Therapeutic Interventions:   Patient seen in PM for skilled ST session focusing on speech goals. SLP asked patient if he had any questions after this morning's family meeting but he did not. When SLP arrived, patient had just finished being suctioned and received breathing treatments from respiratory therapist. His voice was clear, strong and SpO2 was in range of 92-93%. Patient expressed that when MD comes in morning, he typically has not been suctioned yet and so he is unable to wear his PMV and would like to so he could talk to her. SLP to discuss with team to coordinate PMV use in order for patient to be able to communicate when needed/wanted. Patient reported he was worn out from the day. SLP doffed PMV, left patient in WC. He continues to benefit from skilled SLP intervention to maximize speech and swallow function prior to discharge.  Pain Pain Assessment Pain Scale: 0-10 Pain Score: 0-No pain  Therapy/Group: Individual Therapy  Angela Nevin, MA, CCC-SLP Speech  Therapy

## 2021-05-03 NOTE — Progress Notes (Signed)
Occupational Therapy Session Note  Patient Details  Name: Luke Wright MRN: [id number] Date of Birth: 16-Dec-1970  Today's Date: 05/03/2021 OT Individual Time: 1345-1430 OT Individual Time Calculation (min): 45 min    Short Term Goals: Week 2:  OT Short Term Goal 1 (Week 2): Pt will self feed with AE PRN with Max A OT Short Term Goal 2 (Week 2): Pt will brush teeth with AE with Max A OT Short Term Goal 3 (Week 2): Pt will roll L/R with 1 assist to decrease caregiver burden OT Short Term Goal 4 (Week 2): Pt will tolerate wearing B elbow splints for 2 hours with no redness/irritation  Skilled Therapeutic Interventions/Progress Updates:    Pt resting in w/c upon arrival. Pt transported to connecting hallway to [employer]. Pt stated he would like to go outside but it was too cold. Pt stated it was good to look outside and see the sun. Focus on BUE AAROM/AROM. Pt with BUE horizontal abduction/adduction. Pt with improved BUE biceps and able to bring hand to mouth with gravity eliminated. Discussed goal to start working on self feeding with assistance. Pt receptive. Pt disappointed when he had to return to room. Pt remained in w/c with 8L O2 with FiO2 40% via trach. O2 sats 84%. Pt able to activate call bell placed behind head.   Therapy Documentation Precautions:  Precautions Precautions: Fall Precaution Comments: C5 SCI, Asia A, trach Restrictions Weight Bearing Restrictions: No Pain:  Pt c/o ongoing BUE (triceps) discomfort; repositioned   Therapy/Group: Individual Therapy  Rich Brave 05/03/2021, 3:13 PM

## 2021-05-03 NOTE — Progress Notes (Signed)
Physical Therapy Wound Treatment Patient Details  Name: Luke Wright MRN: [id number] Date of Birth: 1970/07/26  Today's Date: 05/03/2021 PT Individual Time: 0929-1010 PT Individual Time Calculation (min): 41 min   Subjective  Subjective: Pt pleasant and agreeable to hydrotherapy. Asking how the wound looks. Patient and Family Stated Goals: None stated Date of Onset:  (Unknown) Prior Treatments: Dressing changes; Santyl  Pain Score:  Pt did not complain of any pain throughout session.   Wound Assessment  Pressure Injury 04/23/21 Buttocks Left Unstageable - Full thickness tissue loss in which the base of the injury is covered by slough (yellow, tan, gray, green or brown) and/or eschar (tan, brown or black) in the wound bed. Unstageable to left buttock wit (Active)  Wound Image   05/03/21 1335  Dressing Type ABD;Barrier Film (skin prep) 05/03/21 1335  Dressing Changed;Clean;Dry;Intact 05/03/21 1335  Dressing Change Frequency Daily 05/03/21 1335  State of Healing Early/partial granulation 05/03/21 1335  Site / Wound Assessment Pink;Yellow 05/03/21 1335  % Wound base Red or Granulating 50% 05/03/21 1335  % Wound base Yellow/Fibrinous Exudate 50% 05/03/21 1335  % Wound base Black/Eschar 0% 05/03/21 1335  % Wound base Other/Granulation Tissue (Comment) 0% 05/03/21 1335  Peri-wound Assessment Intact 05/03/21 1335  Wound Length (cm) 7 cm 05/03/21 1335  Wound Width (cm) 4 cm 05/03/21 1335  Wound Depth (cm) 3.5 cm 05/03/21 1335  Wound Surface Area (cm^2) 28 cm^2 05/03/21 1335  Wound Volume (cm^3) 98 cm^3 05/03/21 1335  Tunneling (cm) 0 05/03/21 1335  Undermining (cm) 2.5 cm at the 12:00 margin. 05/03/21 1335  Margins Unattached edges (unapproximated) 05/03/21 1335  Drainage Amount Minimal 05/03/21 1335  Drainage Description Serosanguineous 05/03/21 1335  Treatment Debridement (Selective);Hydrotherapy (Pulse lavage);Packing (Saline gauze) 05/03/21 1335      Hydrotherapy Pulsed lavage  therapy - wound location: L buttock Pulsed Lavage with Suction (psi): 12 psi Pulsed Lavage with Suction - Normal Saline Used: 1000 mL Pulsed Lavage Tip: Tip with splash shield Selective Debridement Selective Debridement - Location: L buttock Selective Debridement - Tools Used: Forceps;Scalpel Selective Debridement - Tissue Removed: Yellow unviable tissue   Wound Assessment and Plan  Wound Therapy - Assess/Plan/Recommendations Wound Therapy - Clinical Statement: Wound bed appears improved. Pt shown picture at his request and appears pleased with the progress. Daughter entered at the end of session and was not able to see the wound in person. This patient will benefit from continued hydrotherapy for selective removal of unviable tissue, to decrease bioburden, and promote wound bed healing. Wound Therapy - Functional Problem List: SCI - decreased tolerance for position changes; immobility Factors Delaying/Impairing Wound Healing: Immobility;Altered sensation Hydrotherapy Plan: Debridement;Patient/family education;Dressing change;Pulsatile lavage with suction Wound Therapy - Frequency: 6X / week Wound Therapy - Follow Up Recommendations: dressing changes by RN  Wound Therapy Goals- Improve the function of patient's integumentary system by progressing the wound(s) through the phases of wound healing (inflammation - proliferation - remodeling) by: Decrease Necrotic Tissue to: 20% Decrease Necrotic Tissue - Progress: Progressing toward goal Increase Granulation Tissue to: 80% Increase Granulation Tissue - Progress: Progressing toward goal Improve Drainage Characteristics: Min;Serous Improve Drainage Characteristics - Progress: Progressing toward goal Goals/treatment plan/discharge plan were made with and agreed upon by patient/family: Yes Time For Goal Achievement: 7 days Wound Therapy - Potential for Goals: Good  Goals will be updated until maximal potential achieved or discharge criteria  met.  Discharge criteria: when goals achieved, discharge from hospital, MD decision/surgical intervention, no progress towards goals, refusal/missing three consecutive  treatments without notification or medical reason.  GP     Thelma Comp 05/03/2021, 1:59 PM  Rolinda Roan, PT, DPT Acute Rehabilitation Services Pager: [phone] Office: [phone]

## 2021-05-03 NOTE — Progress Notes (Signed)
Pharmacy Antibiotic Note  Luke Wright is a 50 y.o. male admitted on 04/23/2021 with suspected pneumonia.  Patient is reported to have copious green secretions out of his tracheostomy tube Chest x-ray independently reviewed shows left lower lobe consolidation. Pharmacy consulted for Cefepime dosing.  Per discussion with ID, respiratory cultures now growing rare enterobacter cloacae R to Zosyn in addition to moraxella catarrhalis. Due to condition of patient, will conservatively treat and change back to cefepime for coverage of both moraxella and enterobacter at this time.   Pt is on D10 of total abx. Pulm will see the patient later this week and determine length of therapy.  Scr<1  Plan: Continue cefepime 2g IV every 8 hours F/u renal function (quadriplegic), clinical response, duration of therapy  Height: 5' 10.87" (180 cm) Weight: 111 kg (244 lb 11.4 oz) IBW/kg (Calculated) : 75  Temp (24hrs), Avg:97.7 F (36.5 C), Min:97.3 F (36.3 C), Max:98 F (36.7 C)  Recent Labs  Lab 04/30/21 0531 05/01/21 0508  WBC 11.6* 11.0*  CREATININE 0.56* 0.60*     Estimated Creatinine Clearance: 139.7 mL/min (A) (by C-G formula based on SCr of 0.6 mg/dL (L)).    No Known Allergies  Antimicrobials this admission: Cefepime 10/11 > 10/13, 10/13 >> Unasyn 10/13  Microbiology results: 10/11 trach aspirate cx: MORAXELLA CATARRHALIS, RARE ENTEROBACTER CLOACAE   Ulyses Southward, PharmD, BCIDP, AAHIVP, CPP Infectious Disease Pharmacist 05/03/2021 2:40 PM

## 2021-05-03 NOTE — Progress Notes (Signed)
Occupational Therapy Session Note  Patient Details  Name: Luke Wright MRN: [id number] Date of Birth: 1971-06-04  Today's Date: 05/03/2021 OT Co-Treatment Time: 1045-1100 OT Co-Treatment Time Calculation (min): 15 min Co tx with PT and SLP for family conference (total time 60 mins)  Short Term Goals: Week 2:  OT Short Term Goal 1 (Week 2): Pt will self feed with AE PRN with Max A OT Short Term Goal 2 (Week 2): Pt will brush teeth with AE with Max A OT Short Term Goal 3 (Week 2): Pt will roll L/R with 1 assist to decrease caregiver burden OT Short Term Goal 4 (Week 2): Pt will tolerate wearing B elbow splints for 2 hours with no redness/irritation  Skilled Therapeutic Interventions/Progress Updates:    Co tx with PT and SLP for family conference (daughter and pt present) with MD and CSW present. Discussed OT goals regarding self care. Discussed common OT/PT goals of bed mobility, sitting balance, OOB tolerance, BUE elbow splints, positioning in w/c and bed. Neither daughter or pt with any followup questions at this time.  Therapy Documentation Precautions:  Precautions Precautions: Fall Precaution Comments: C5 SCI, Asia A, trach Restrictions Weight Bearing Restrictions: No Pain: Pain Assessment Pain Scale: 0-10 Pain Score: 0-No pain   Therapy/Group: Co-Treatment  Rich Brave 05/03/2021, 12:13 PM

## 2021-05-03 NOTE — Progress Notes (Signed)
Speech Language Pathology Daily Session Note  Patient Details  Name: Luke Wright MRN: [id number] Date of Birth: 04/07/1971  Today's Date: 05/03/2021 SLP Individual Time: 1000-1015 SLP Individual Time Calculation (min): 15 min  Short Term Goals: Week 2: SLP Short Term Goal 1 (Week 2): Patient will demonstrate 90% speech intelligibility at the sentence level with Mod I for use of speech intelligibility strategies. SLP Short Term Goal 2 (Week 2): Patient will wear PMSV without O2 saturations dropping below 90% and without reports/signs of distress throughout an entirety of a session. SLP Short Term Goal 3 (Week 2): Caregiver education will be provided to the patient's daugher so that she can independently donn/doff PMSV SLP Short Term Goal 4 (Week 2): Patient will consume current diet of regular textures with thin liquids without overt s/s of aspiration with Mod I for use of swallowing compensatory strategies.  Skilled Therapeutic Interventions:   Patient seen with daughter present for skilled ST session focused on family education. (MD, OT, PT also present in room). SLP described PMV valve, demonstrated donning and doffing, described progress with voice as well as PO intake (regular solids, thin liquids). Daughter expressed understanding and SLP informed both daughter and patient that education will continue. Patient left in bed with PT, OT, daughter present in room. He continues to benefit from skilled SLP intervention to maximize speech and swallow function prior to discharge.  Pain Pain Assessment Pain Scale: 0-10 Pain Score: 0-No pain  Therapy/Group: Individual Therapy  Angela Nevin, MA, CCC-SLP Speech Therapy

## 2021-05-03 NOTE — Progress Notes (Signed)
Physical Therapy Session Note  Patient Details  Name: Luke Wright MRN: [id number] Date of Birth: 23-May-1971  Today's Date: 05/03/2021 PT Individual Time: 1015-1045 PT Individual Time Calculation (min): 30 min   Short Term Goals: Week 2:  PT Short Term Goal 1 (Week 2): Pt will initiate power wheelchair mobility PT Short Term Goal 2 (Week 2): Pt will recall pressure relief schedule and direct caregivers in assisting him and/or performing with min cueing via PWC PT Short Term Goal 3 (Week 2): Pt family will initiate hands-on family education training  Skilled Therapeutic Interventions/Progress Updates: Pt presented in bed with interdisciplinary team present for family conference. Continued discussion with daughter [name] regarding current functional level. Discussed use of [place] upon d/c, positioning, current use of TED hose and ace bandages to assist with BP. Also informed that pt had w/c eval and will receive loaner chair tomorrow. Advised that we have been working on upright positioning to start incorporating pressure relief. [name] with questions regarding how power chair will work and advised will start with head array as that is where he has movement and will see if anything else will work. Pt and daughter verbalized understanding. Family meeting followed by PT/OT session.      Therapy Documentation Precautions:  Precautions Precautions: Fall Precaution Comments: C5 SCI, Asia A, trach Restrictions Weight Bearing Restrictions: No General:   Vital Signs: Therapy Vitals Temp: (!) 97.3 F (36.3 C) Pulse Rate: 86 Resp: 16 BP: 100/69 Patient Position (if appropriate): Sitting Oxygen Therapy SpO2: 96 % O2 Device: Tracheostomy Collar O2 Flow Rate (L/min): 8 L/min FiO2 (%): 35 % Pain:   Mobility:   Locomotion :    Trunk/Postural Assessment :    Balance:   Exercises:   Other Treatments:      Therapy/Group: Individual Therapy  Valeen Borys 05/03/2021, 4:32  PM
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
## 2021-05-04 DIAGNOSIS — E876 Hypokalemia: Secondary | ICD-10-CM

## 2021-05-04 DIAGNOSIS — D72829 Elevated white blood cell count, unspecified: Secondary | ICD-10-CM

## 2021-05-04 DIAGNOSIS — Z430 Encounter for attention to tracheostomy: Secondary | ICD-10-CM

## 2021-05-04 LAB — CULTURE, RESPIRATORY W GRAM STAIN: Culture: NORMAL

## 2021-05-04 NOTE — Progress Notes (Signed)
Physical Therapy Session Note  Patient Details  Name: Luke Wright MRN: [id number] Date of Birth: February 26, 1971  Today's Date: 05/04/2021 PT Individual Time: 1405-1530 PT Individual Time Calculation (min): 85 min   Short Term Goals: Week 2:  PT Short Term Goal 1 (Week 2): Pt will initiate power wheelchair mobility PT Short Term Goal 2 (Week 2): Pt will recall pressure relief schedule and direct caregivers in assisting him and/or performing with min cueing via PWC PT Short Term Goal 3 (Week 2): Pt family will initiate hands-on family education training  Skilled Therapeutic Interventions/Progress Updates: Pt presented in TIS with Barbara Cower from [facility] present with pt agreeable to therapy. Session focused on introduction to power chair and adjustments needed as necessary. Pt transferred via Maxi sky to power chair from TIS. Pt instructed on pressure release and control of power chair via head array. Barbara Cower present to make adjustments to head array as needed and pt able to turn L/R and move forward with min cues. Pt was able to drive to day room, but continues to require assist due to placement of pressure release button. Barbara Cower will return on Monday to made additional adjustments to power chair. At end of session pt transferred to bed via maxi sky. PTA removed ace bandages and TED hose once back in bed. Pt positioned with pillows and handed off to Steph OT for next session.      Therapy Documentation Precautions:  Precautions Precautions: Fall Precaution Comments: C5 SCI, [place] A, trach Restrictions Weight Bearing Restrictions: No General:   Vital Signs:     Therapy/Group: Individual Therapy  Marlo Arriola Lamondre Wesche, PTA  05/04/2021, 4:02 PM

## 2021-05-04 NOTE — Progress Notes (Signed)
Occupational Therapy Session Note  Patient Details  Name: GABRIEN MENTINK MRN: [id number] Date of Birth: 1971-06-11  Today's Date: 05/04/2021 OT Individual Time: 1100-1200 OT Individual Time Calculation (min): 60 min    Short Term Goals: Week 2:  OT Short Term Goal 1 (Week 2): Pt will self feed with AE PRN with Max A OT Short Term Goal 2 (Week 2): Pt will brush teeth with AE with Max A OT Short Term Goal 3 (Week 2): Pt will roll L/R with 1 assist to decrease caregiver burden OT Short Term Goal 4 (Week 2): Pt will tolerate wearing B elbow splints for 2 hours with no redness/irritation  Skilled Therapeutic Interventions/Progress Updates:    Pt resting in bed upon arrival with nursing students assessing pt. OT intervention with focus on bed mobility, activity tolerance, and increasing independence with directing care. Ted hose and ACE wraps donned prior to placing sling for MaxiSky transfer to w/c. O2 sats >90% on 8L with FiO2 40%. Pt asymptomatic for hypotension throughout session. Pt tolerated activity well with no c/o increased pain. Pt remained in TIS w/c with soft call bell placed where pt is able to activate.  Therapy Documentation Precautions:  Precautions Precautions: Fall Precaution Comments: C5 SCI, [place] A, trach Restrictions Weight Bearing Restrictions: No   Pain: Pt c/o RUE pain (elbow to shoulder triceps); repositioned   Therapy/Group: Individual Therapy  Rich Brave 05/04/2021, 12:10 PM

## 2021-05-04 NOTE — Progress Notes (Signed)
Occupational Therapy Session Note  Patient Details  Name: Luke Wright MRN: [id number] Date of Birth: 11/29/1970  Today's Date: 05/04/2021 OT Individual Time: 1530-1600 OT Individual Time Calculation (min): 30 min    Short Term Goals: Week 1:  OT Short Term Goal 1 (Week 1): Pt will self feed with AE PRN with Max A OT Short Term Goal 1 - Progress (Week 1): Progressing toward goal OT Short Term Goal 2 (Week 1): Pt will brush teeth with AE with Max A OT Short Term Goal 2 - Progress (Week 1): Progressing toward goal OT Short Term Goal 3 (Week 1): Pt will roll L/R with 1 assist to decrease caregiver burden OT Short Term Goal 3 - Progress (Week 1): Progressing toward goal OT Short Term Goal 4 (Week 1): Pt will tolerate wearing B elbow splints for 2 hours with no redness/irritation OT Short Term Goal 4 - Progress (Week 1): Progressing toward goal  Skilled Therapeutic Interventions/Progress Updates:     Pt received in bed with no pain with direct handoff from PTA  Therapeutic activity Focus of session on review of Assistive Tech options and establishing pt AT goals. After discussing topics (computer use, phone use, TV control, and environmental control). Pt reporting phone, TV and environment control are most appropriate and motivating. OT uses accessibility features on pts iPhone to set to automatically answer on speaker as well as turn on voice control. OT demo use of show numbers feature as pt likes to listen to music and open pandora app. Pt able to use feature to select preferred station and start music. Pt grins widely as music plays. Educated pt that if speaking valve not on, may not be able to use voice and mouth stick may be a good back up option. Will try to practice at a later date.   Pt left at end of session in bed with exit alarm on, call light in reach and all needs met   Therapy Documentation Precautions:  Precautions Precautions: Fall Precaution Comments: C5 SCI, [place] A,  trach Restrictions Weight Bearing Restrictions: No  Therapy/Group: Individual Therapy  Stephanie M Schlosser 05/04/2021, 6:44 AM 

## 2021-05-04 NOTE — Progress Notes (Signed)
Physical Therapy Wound Treatment Patient Details  Name: Luke Wright MRN: [id number] Date of Birth: 02-Aug-1970  Today's Date: 05/04/2021 PT Individual Time: 0916-0950 PT Individual Time Calculation (min): 34 min   Subjective  Subjective: Pt pleasant and agreeable to hydrotherapy. Patient and Family Stated Goals: None stated Date of Onset:  (Unknown) Prior Treatments: Dressing changes; Santyl  Pain Score: Pt reports significant pain with positioning on his R side - mainly the RUE.   Wound Assessment  Pressure Injury 04/23/21 Buttocks Left Unstageable - Full thickness tissue loss in which the base of the injury is covered by slough (yellow, tan, gray, green or brown) and/or eschar (tan, brown or black) in the wound bed. Unstageable to left buttock wit (Active)  Dressing Type ABD;Barrier Film (skin prep);Gauze (Comment);Moist to moist;Santyl 05/04/21 1126  Dressing Changed;Clean;Dry;Intact 05/04/21 1126  Dressing Change Frequency Daily 05/04/21 1126  State of Healing Early/partial granulation 05/03/21 1335  Site / Wound Assessment Red;Yellow 05/04/21 1126  % Wound base Red or Granulating 55% 05/04/21 1126  % Wound base Yellow/Fibrinous Exudate 45% 05/04/21 1126  % Wound base Black/Eschar 0% 05/04/21 1126  % Wound base Other/Granulation Tissue (Comment) 0% 05/04/21 1126  Peri-wound Assessment Intact 05/04/21 1126  Wound Length (cm) 7 cm 05/03/21 1335  Wound Width (cm) 4 cm 05/03/21 1335  Wound Depth (cm) 3.5 cm 05/03/21 1335  Wound Surface Area (cm^2) 28 cm^2 05/03/21 1335  Wound Volume (cm^3) 98 cm^3 05/03/21 1335  Tunneling (cm) 0 05/03/21 1335  Undermining (cm) 2.5 cm at the 12:00 margin. 05/03/21 1335  Margins Unattached edges (unapproximated) 05/04/21 1126  Drainage Amount Minimal 05/04/21 1126  Drainage Description Serosanguineous 05/04/21 1126  Treatment Debridement (Selective);Hydrotherapy (Pulse lavage);Packing (Saline gauze) 05/04/21 1126      Hydrotherapy Pulsed  lavage therapy - wound location: L buttock Pulsed Lavage with Suction (psi): 12 psi Pulsed Lavage with Suction - Normal Saline Used: 1000 mL Pulsed Lavage Tip: Tip with splash shield Selective Debridement Selective Debridement - Location: L buttock Selective Debridement - Tools Used: Forceps;Scalpel Selective Debridement - Tissue Removed: Yellow unviable tissue   Wound Assessment and Plan  Wound Therapy - Assess/Plan/Recommendations Wound Therapy - Clinical Statement: Progressing with debridement and wound bed continues to appear improved. This patient will benefit from continued hydrotherapy for selective removal of unviable tissue, to decrease bioburden, and promote wound bed healing. Wound Therapy - Functional Problem List: SCI - decreased tolerance for position changes; immobility Factors Delaying/Impairing Wound Healing: Immobility;Altered sensation Hydrotherapy Plan: Debridement;Patient/family education;Dressing change;Pulsatile lavage with suction Wound Therapy - Frequency: 6X / week Wound Therapy - Follow Up Recommendations: dressing changes by RN  Wound Therapy Goals- Improve the function of patient's integumentary system by progressing the wound(s) through the phases of wound healing (inflammation - proliferation - remodeling) by: Decrease Necrotic Tissue to: 20% Decrease Necrotic Tissue - Progress: Progressing toward goal Increase Granulation Tissue to: 80% Increase Granulation Tissue - Progress: Progressing toward goal Improve Drainage Characteristics: Min;Serous Improve Drainage Characteristics - Progress: Progressing toward goal Goals/treatment plan/discharge plan were made with and agreed upon by patient/family: Yes Time For Goal Achievement: 7 days Wound Therapy - Potential for Goals: Good  Goals will be updated until maximal potential achieved or discharge criteria met.  Discharge criteria: when goals achieved, discharge from hospital, MD decision/surgical intervention,  no progress towards goals, refusal/missing three consecutive treatments without notification or medical reason.  GP     Thelma Comp 05/04/2021, 11:32 AM  Rolinda Roan, PT, DPT Acute Rehabilitation Services Pager: [phone]  Office: [phone]
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
## 2021-05-05 MED ORDER — OXYCODONE-ACETAMINOPHEN 5-325 MG PO TABS
1.0000 | ORAL_TABLET | Freq: Every day | ORAL | Status: DC | PRN
  Administered 2021-05-11 – 2021-05-23 (×6): 1 via ORAL
  Filled 2021-05-05 (×11): qty 1

## 2021-05-05 MED ORDER — OXYCODONE HCL 5 MG PO TABS
5.0000 mg | ORAL_TABLET | Freq: Three times a day (TID) | ORAL | Status: DC | PRN
  Administered 2021-05-11 – 2021-05-25 (×12): 5 mg via ORAL
  Filled 2021-05-05 (×16): qty 1

## 2021-05-05 NOTE — Progress Notes (Signed)
Physical Therapy Wound Treatment Patient Details  Name: CLEVESTER HELZER MRN: [id number] Date of Birth: Jan 20, 1971  Today's Date: 05/05/2021 PT Individual Time: 9381-0175 PT Individual Time Calculation (min): 44 min   Subjective  Subjective: Pt pleasant and agreeable to hydrotherapy. Patient and Family Stated Goals: None stated Date of Onset:  (Unknown) Prior Treatments: Dressing changes; Santyl  Pain Score: Painful BUE's  Wound Assessment  Pressure Injury 04/23/21 Buttocks Left Unstageable - Full thickness tissue loss in which the base of the injury is covered by slough (yellow, tan, gray, green or brown) and/or eschar (tan, brown or black) in the wound bed. Unstageable to left buttock wit (Active)  Dressing Type ABD;Barrier Film (skin prep);Gauze (Comment);Moist to moist;Santyl 05/05/21 1629  Dressing Changed;Clean;Dry;Intact 05/05/21 1629  Dressing Change Frequency Daily 05/05/21 1629  State of Healing Early/partial granulation 05/05/21 1629  Site / Wound Assessment Red;Yellow 05/05/21 1629  % Wound base Red or Granulating 55% 05/04/21 1126  % Wound base Yellow/Fibrinous Exudate 45% 05/04/21 1126  % Wound base Black/Eschar 0% 05/04/21 1126  % Wound base Other/Granulation Tissue (Comment) 0% 05/04/21 1126  Peri-wound Assessment Intact 05/05/21 1629  Wound Length (cm) 7 cm 05/03/21 1335  Wound Width (cm) 4 cm 05/03/21 1335  Wound Depth (cm) 3.5 cm 05/03/21 1335  Wound Surface Area (cm^2) 28 cm^2 05/03/21 1335  Wound Volume (cm^3) 98 cm^3 05/03/21 1335  Tunneling (cm) 0 05/03/21 1335  Undermining (cm) 2.5 cm at the 12:00 margin. 05/03/21 1335  Margins Unattached edges (unapproximated) 05/05/21 1629  Drainage Amount Minimal 05/05/21 1629  Drainage Description Serosanguineous 05/05/21 1629  Treatment Debridement (Selective);Hydrotherapy (Pulse lavage);Packing (Saline gauze) 05/05/21 1629      Hydrotherapy Pulsed lavage therapy - wound location: L buttock Pulsed Lavage with  Suction (psi): 12 psi Pulsed Lavage with Suction - Normal Saline Used: 1000 mL Pulsed Lavage Tip: Tip with splash shield Selective Debridement Selective Debridement - Location: L buttock Selective Debridement - Tools Used: Forceps;Scalpel Selective Debridement - Tissue Removed: Yellow unviable tissue   Wound Assessment and Plan  Wound Therapy - Assess/Plan/Recommendations Wound Therapy - Clinical Statement: Hydrotherapy performed with pt in left sidelying today due to RUE pain. Wound bed appears to be improving with increased granulation tissue. This patient will benefit from continued hydrotherapy for selective removal of unviable tissue, to decrease bioburden, and promote wound bed healing. Wound Therapy - Functional Problem List: SCI - decreased tolerance for position changes; immobility Factors Delaying/Impairing Wound Healing: Immobility;Altered sensation Hydrotherapy Plan: Debridement;Patient/family education;Dressing change;Pulsatile lavage with suction Wound Therapy - Frequency: 6X / week Wound Therapy - Follow Up Recommendations: dressing changes by RN  Wound Therapy Goals- Improve the function of patient's integumentary system by progressing the wound(s) through the phases of wound healing (inflammation - proliferation - remodeling) by: Decrease Necrotic Tissue to: 20% Decrease Necrotic Tissue - Progress: Progressing toward goal Increase Granulation Tissue to: 80% Increase Granulation Tissue - Progress: Progressing toward goal Improve Drainage Characteristics: Min;Serous Improve Drainage Characteristics - Progress: Progressing toward goal Goals/treatment plan/discharge plan were made with and agreed upon by patient/family: Yes Time For Goal Achievement: 7 days Wound Therapy - Potential for Goals: Good  Goals will be updated until maximal potential achieved or discharge criteria met.  Discharge criteria: when goals achieved, discharge from hospital, MD decision/surgical  intervention, no progress towards goals, refusal/missing three consecutive treatments without notification or medical reason.  GP  Wyona Almas, PT, DPT Acute Rehabilitation Services Pager [phone] Office [phone]      Deno Etienne 05/05/2021,  4:32 PM
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
## 2021-05-05 NOTE — Progress Notes (Signed)
Speech Language Pathology Daily Session Note  Patient Details  Name: Luke Wright MRN: [id number] Date of Birth: 1970/10/20  Today's Date: 05/05/2021 SLP Individual Time: 1350-1430 SLP Individual Time Calculation (min): 40 min  Short Term Goals: Week 2: SLP Short Term Goal 1 (Week 2): Patient will demonstrate 90% speech intelligibility at the sentence level with Mod I for use of speech intelligibility strategies. SLP Short Term Goal 2 (Week 2): Patient will wear PMSV without O2 saturations dropping below 90% and without reports/signs of distress throughout an entirety of a session. SLP Short Term Goal 3 (Week 2): Caregiver education will be provided to the patient's daugher so that she can independently donn/doff PMSV SLP Short Term Goal 4 (Week 2): Patient will consume current diet of regular textures with thin liquids without overt s/s of aspiration with Mod I for use of swallowing compensatory strategies.  Skilled Therapeutic Interventions: Pt seen for skilled ST with focus on speech goals, pt seen later in the day due to PT hydro treatment at scheduled ST time. Pt with PMSV donned when SLP entered room, states he has been wearing it "most of the day". Provided educated on only wearing PMSV with full staff supervision due to pt unable to remove himself, pt verbalized understanding of rationale behind supervision. SLP facilitating lunch meal but providing total assist with feeding, pt tolerating regular diet and thin liquids with no observed difficulty or s/s aspiration. RN present for med pass, pt tolerating ~10 whole pills with liquid wash with no difficulty. Pt tolerating PMSV for entirety of session with speech intelligibility ~90% at conversation level with only initial cue for increased vocal intensity. SpO2 >88% during session. Pt handed off to RN to finish feeding, cont ST POC.   Pain Pain Assessment Pain Scale: 0-10 Pain Score: 0-No pain  Therapy/Group: Individual  Therapy  Tacey Ruiz 05/05/2021, 2:17 PM

## 2021-05-06 NOTE — Progress Notes (Signed)
Patient requested  and informed writer to have digital stimulation and and dressing changed on next shift after  his family left and after finishing dinner brought in by family. Made next nurse aware of patient request.

## 2021-05-06 NOTE — Progress Notes (Signed)
Pharmacy Antibiotic Note  Luke Wright is a 50 y.o. male admitted on 04/23/2021 with suspected pneumonia.  Patient is reported to have copious green secretions out of his tracheostomy tube Chest x-ray independently reviewed shows left lower lobe consolidation. Pharmacy consulted for Cefepime dosing.  Per discussion with ID, respiratory cultures now growing rare enterobacter cloacae R to Zosyn in addition to moraxella catarrhalis. Due to condition of patient, will conservatively treat and change back to cefepime for coverage of both moraxella and enterobacter at this time.   10/23 - Per discussion with MD, Pulmonology will see the patient tomorrow and determine length of abx therapy.   Plan: Continue Cefepime 2g IV every 8 hours F/u renal function (quadriplegic), clinical response, duration of therapy  Height: 5' 10.87" (180 cm) Weight: 111 kg (244 lb 11.4 oz) IBW/kg (Calculated) : 75  Temp (24hrs), Avg:97.8 F (36.6 C), Min:97.7 F (36.5 C), Max:97.8 F (36.6 C)  Recent Labs  Lab 04/30/21 0531 05/01/21 0508  WBC 11.6* 11.0*  CREATININE 0.56* 0.60*     Estimated Creatinine Clearance: 139.7 mL/min (A) (by C-G formula based on SCr of 0.6 mg/dL (L)).    No Known Allergies  Antimicrobials this admission: Cefepime 10/11 > 10/13, 10/13 >> Unasyn 10/13  Microbiology results: 10/11 trach aspirate cx: MORAXELLA CATARRHALIS, RARE ENTEROBACTER CLOACAE   Jerrilyn Cairo, PharmD PGY1 Pharmacy Resident Phone [phone] 05/06/2021 1:39 PM   Please check AMION for all [facility] Pharm[facility] phone numbers After 10:00 PM, call Main Pharmacy [phone]

## 2021-05-07 LAB — CBC WITH DIFFERENTIAL/PLATELET
Abs Immature Granulocytes: 0.04 10*3/uL (ref 0.00–0.07)
Basophils Absolute: 0.1 10*3/uL (ref 0.0–0.1)
Basophils Relative: 1 %
Eosinophils Absolute: 0.2 10*3/uL (ref 0.0–0.5)
Eosinophils Relative: 2 %
HCT: 35.3 % — ABNORMAL LOW (ref 39.0–52.0)
Hemoglobin: 11.2 g/dL — ABNORMAL LOW (ref 13.0–17.0)
Immature Granulocytes: 0 %
Lymphocytes Relative: 25 %
Lymphs Abs: 3 10*3/uL (ref 0.7–4.0)
MCH: 27.1 pg (ref 26.0–34.0)
MCHC: 31.7 g/dL (ref 30.0–36.0)
MCV: 85.5 fL (ref 80.0–100.0)
Monocytes Absolute: 0.7 10*3/uL (ref 0.1–1.0)
Monocytes Relative: 6 %
Neutro Abs: 7.7 10*3/uL (ref 1.7–7.7)
Neutrophils Relative %: 66 %
Platelets: 421 10*3/uL — ABNORMAL HIGH (ref 150–400)
RBC: 4.13 MIL/uL — ABNORMAL LOW (ref 4.22–5.81)
RDW: 16.8 % — ABNORMAL HIGH (ref 11.5–15.5)
WBC: 11.7 10*3/uL — ABNORMAL HIGH (ref 4.0–10.5)
nRBC: 0 % (ref 0.0–0.2)

## 2021-05-07 LAB — BLOOD GAS, ARTERIAL
Acid-Base Excess: 0.5 mmol/L (ref 0.0–2.0)
Bicarbonate: 24.3 mmol/L (ref 20.0–28.0)
FIO2: 21
O2 Saturation: 91.9 %
Patient temperature: 37
Sample type: 548791
pCO2 arterial: 36.8 mmHg (ref 32.0–48.0)
pH, Arterial: 7.435 (ref 7.350–7.450)
pO2, Arterial: 60.9 mmHg — ABNORMAL LOW (ref 83.0–108.0)

## 2021-05-07 LAB — BASIC METABOLIC PANEL
Anion gap: 10 (ref 5–15)
BUN: 11 mg/dL (ref 6–20)
CO2: 25 mmol/L (ref 22–32)
Calcium: 9.3 mg/dL (ref 8.9–10.3)
Chloride: 103 mmol/L (ref 98–111)
Creatinine, Ser: 0.6 mg/dL — ABNORMAL LOW (ref 0.61–1.24)
GFR, Estimated: >60 mL/min (ref >60)
Glucose, Bld: 103 mg/dL — ABNORMAL HIGH (ref 70–99)
Potassium: 3.9 mmol/L (ref 3.5–5.1)
Sodium: 138 mmol/L (ref 135–145)

## 2021-05-07 LAB — GLUCOSE, CAPILLARY: Glucose-Capillary: 113 mg/dL — ABNORMAL HIGH (ref 70–99)

## 2021-05-07 MED ORDER — ZOLPIDEM TARTRATE 5 MG PO TABS
5.0000 mg | ORAL_TABLET | Freq: Every day | ORAL | Status: DC
  Administered 2021-05-07 – 2021-05-24 (×18): 5 mg via ORAL
  Filled 2021-05-07 (×18): qty 1

## 2021-05-07 NOTE — Progress Notes (Signed)
ABG sample on room air obtained x2 attempts. Huntley Dec in lab notified of specimen being sent. Pt placed back on aerosol trach collar 5L 28%. RT will continue to monitor and be available as needed.

## 2021-05-07 NOTE — Progress Notes (Signed)
Patient ID: Luke Wright, male   DOB: [birth year], 50 y.o.   MRN: [id number]  SW followed up with [facility] to discuss how visit went with pt and family on Thursday, and discuss if or when services would begin. Reports that visit went well, and can have clinical manager follow-up on when services could potentially begin. Reports they are working on a nurse to possibly visit him while he is here to ensure they can provide care requires. States will f/u with SW on when this will occur.  SW submitted CAP/DA referral [phone]) with [place] [facility].   [name], MSW, LCSWA Office: [phone] Cell: [phone] Fax: [phone]
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
## 2021-05-07 NOTE — Progress Notes (Signed)
Occupational Therapy Session Note  Patient Details  Name: Luke Wright MRN: [id number] Date of Birth: 08/18/1970  Today's Date: 05/07/2021 OT Individual Time: 1300-1330 OT Individual Time Calculation (min): 30 min    Short Term Goals: Week 1:  OT Short Term Goal 1 (Week 1): Pt will self feed with AE PRN with Max A OT Short Term Goal 1 - Progress (Week 1): Progressing toward goal OT Short Term Goal 2 (Week 1): Pt will brush teeth with AE with Max A OT Short Term Goal 2 - Progress (Week 1): Progressing toward goal OT Short Term Goal 3 (Week 1): Pt will roll L/R with 1 assist to decrease caregiver burden OT Short Term Goal 3 - Progress (Week 1): Progressing toward goal OT Short Term Goal 4 (Week 1): Pt will tolerate wearing B elbow splints for 2 hours with no redness/irritation OT Short Term Goal 4 - Progress (Week 1): Progressing toward goal Week 2:  OT Short Term Goal 1 (Week 2): Pt will self feed with AE PRN with Max A OT Short Term Goal 2 (Week 2): Pt will brush teeth with AE with Max A OT Short Term Goal 3 (Week 2): Pt will roll L/R with 1 assist to decrease caregiver burden OT Short Term Goal 4 (Week 2): Pt will tolerate wearing B elbow splints for 2 hours with no redness/irritation   Skilled Therapeutic Interventions/Progress Updates:    Pt greeted at time of session supine in bed resting, lunch tray had not been touched. No pain reported at rest. HOB elevated and placed PMV, pt able to direct care throughout session. RT entered at this time for blood draw for home O2 test. After blood drawn, focused on self feeding hand held sandwich with hand over hand assist to hold fingers around sandwich and pt able to bring to mouth. Eating approx 30% of sandwich and 100% of brownie. Max/total for drink as well. Remainder of session on passive stretch to biceps with Modified Ashworth scale testing, pt at level 4 2/2 rigidity. Pt left bed level with alarm on call bell in reach and O2 sats  95%.   Therapy Documentation Precautions:  Precautions Precautions: Fall Precaution Comments: C5 SCI, [place] A, trach Restrictions Weight Bearing Restrictions: No     Therapy/Group: Individual Therapy  Erasmo Score 05/07/2021, 1:07 PM

## 2021-05-07 NOTE — Discharge Instructions (Addendum)
Inpatient Rehab Discharge Instructions  Luke Wright Discharge date and time: 05/25/21   Activities/Precautions/ Functional Status: Activity: Pressure relief measures every 20-30 minutes when in chair. Turn every 2 hours when in bed Diet: regular diet--needs assist and supervision for safety with  meals.  Wound Care: Cleanse area with normal saline and pat dry. Apply Santyl with moist to dry dressing to ulcer on sacrum. Change twice a day and more frequently if soiled.  --foley care twice a day and additionally after BM.   Functional status:  ___ No restrictions     ___ Walk up steps independently _X__ 24/7 supervision/assistance   ___ Walk up steps with assistance ___ Intermittent supervision/assistance  ___ Bathe/dress independently ___ Walk with walker     _X__ Bathe/dress with assistance ___ Walk Independently    ___ Shower independently ___ Walk with assistance    ___ Shower with assistance _X__ No alcohol     ___ Return to work/school ________   Special Instructions:   COMMUNITY REFERRALS UPON DISCHARGE:     Home Health:    Private Duty Nursing for wound care and trach care                Agency: [employer]   Phone: [phone]  Medical Equipment/Items Ordered: hoyer lift, fully electric hospital bed, track care kits and supplies, portable suction, humidification, oxygen, afflo vest                                                 Agency/Supplier: [employer] [phone]   Medical Equipment/Items Ordered: incontinence supplies (adult briefs and chux)                                                 Agency/Supplier: [employer] [phone]  GENERAL COMMUNITY RESOURCES FOR PATIENT/FAMILY:  1) A referral was submitted for CAP/DA services with [facility] [phone], in which patient was placed on the waiting list. Be sure to expect a packet in the mail in which medical forms will need to be completed by a physician and returned to CAP/DA [facility].   2)  [employer] [employer] [phone]. Please call within 2-3 business days to discuss scheduling your appointment.    My questions have been answered and I understand these instructions. I will adhere to these goals and the provided educational materials after my discharge from the hospital.  Patient/Caregiver Signature _______________________________ Date __________  Clinician Signature _______________________________________ Date __________  Please bring this form and your medication list with you to all your follow-up doctor's appointments.

## 2021-05-07 NOTE — Progress Notes (Signed)
Physical Therapy Wound Treatment Patient Details  Name: Luke Wright MRN: [id number] Date of Birth: 01-01-1971  Today's Date: 05/07/2021 PT Individual Time: 0923-1002 PT Individual Time Calculation (min): 39 min   Subjective  Subjective: Pt pleasant and agreeable to hydrotherapy. Patient and Family Stated Goals: None stated Date of Onset:  (Unknown) Prior Treatments: Dressing changes; Santyl  Pain Score: Pt reports significant pain in the RUE during positioning in R sidelying. Pt reports he prefers being on his R side for hydrotherapy, as therapist trialed L sidelying over the weekend.   Wound Assessment  Pressure Injury 04/23/21 Buttocks Left Unstageable - Full thickness tissue loss in which the base of the injury is covered by slough (yellow, tan, gray, green or brown) and/or eschar (tan, brown or black) in the wound bed. Unstageable to left buttock wit (Active)  Dressing Type Barrier Film (skin prep);Foam - Lift dressing to assess site every shift;Gauze (Comment);Moist to moist;Santyl 05/07/21 1155  Dressing Changed;Clean;Dry;Intact 05/07/21 1155  Dressing Change Frequency Daily 05/07/21 1155  State of Healing Early/partial granulation 05/07/21 1155  Site / Wound Assessment Red;Yellow 05/07/21 1155  % Wound base Red or Granulating 55% 05/07/21 1155  % Wound base Yellow/Fibrinous Exudate 45% 05/07/21 1155  % Wound base Black/Eschar 0% 05/07/21 1155  % Wound base Other/Granulation Tissue (Comment) 0% 05/07/21 1155  Peri-wound Assessment Intact 05/07/21 1155  Wound Length (cm) 7 cm 05/03/21 1335  Wound Width (cm) 4 cm 05/03/21 1335  Wound Depth (cm) 3.5 cm 05/03/21 1335  Wound Surface Area (cm^2) 28 cm^2 05/03/21 1335  Wound Volume (cm^3) 98 cm^3 05/03/21 1335  Tunneling (cm) 0 05/03/21 1335  Undermining (cm) 2.5 cm at the 12:00 margin. 05/03/21 1335  Margins Unattached edges (unapproximated) 05/07/21 1155  Drainage Amount Minimal 05/07/21 1155  Drainage Description  Serosanguineous 05/07/21 1155  Treatment Debridement (Selective);Hydrotherapy (Pulse lavage);Packing (Saline gauze) 05/07/21 1155      Hydrotherapy Pulsed lavage therapy - wound location: L buttock Pulsed Lavage with Suction (psi): 12 psi Pulsed Lavage with Suction - Normal Saline Used: 1000 mL Pulsed Lavage Tip: Tip with splash shield Selective Debridement Selective Debridement - Location: L buttock Selective Debridement - Tools Used: Forceps;Scalpel Selective Debridement - Tissue Removed: Yellow unviable tissue   Wound Assessment and Plan  Wound Therapy - Assess/Plan/Recommendations Wound Therapy - Clinical Statement: Pt continues to complain of RUE pain with positioning. Reports he prefers to be in R sidelying vs L sidelying, however session limited due to pain. Wound bed appears to be improving with increased granulation tissue. Feel it is appropriate to decrease frequency to 3x/week. Moving forward, hydrotherapy will continue on a MWF schedule. Wound Therapy - Functional Problem List: SCI - decreased tolerance for position changes; immobility Factors Delaying/Impairing Wound Healing: Immobility;Altered sensation Hydrotherapy Plan: Debridement;Patient/family education;Dressing change;Pulsatile lavage with suction Wound Therapy - Frequency: 6X / week Wound Therapy - Follow Up Recommendations: dressing changes by RN  Wound Therapy Goals- Improve the function of patient's integumentary system by progressing the wound(s) through the phases of wound healing (inflammation - proliferation - remodeling) by: Decrease Necrotic Tissue to: 20% Decrease Necrotic Tissue - Progress: Progressing toward goal Increase Granulation Tissue to: 80% Increase Granulation Tissue - Progress: Progressing toward goal Improve Drainage Characteristics: Min;Serous Improve Drainage Characteristics - Progress: Progressing toward goal Goals/treatment plan/discharge plan were made with and agreed upon by  patient/family: Yes Time For Goal Achievement: 7 days Wound Therapy - Potential for Goals: Good  Goals will be updated until maximal potential achieved or discharge criteria  met.  Discharge criteria: when goals achieved, discharge from hospital, MD decision/surgical intervention, no progress towards goals, refusal/missing three consecutive treatments without notification or medical reason.  GP     Gerarda Gunther, PT, DPT Acute Rehabilitation Services Pager: [phone] Office: [phone]

## 2021-05-07 NOTE — Progress Notes (Signed)
Physical Therapy Session Note  Patient Details  Name: Luke Wright MRN: [id number] Date of Birth: October 16, 1970  Today's Date: 05/07/2021 PT Individual Time: 1345-1430 PT Individual Time Calculation (min): 45 min  PT Amount of Missed Time (min): 30 Minutes PT Missed Treatment Reason: Patient fatigue  Short Term Goals: Week 2:  PT Short Term Goal 1 (Week 2): Pt will initiate power wheelchair mobility PT Short Term Goal 2 (Week 2): Pt will recall pressure relief schedule and direct caregivers in assisting him and/or performing with min cueing via PWC PT Short Term Goal 3 (Week 2): Pt family will initiate hands-on family education training  Skilled Therapeutic Interventions/Progress Updates:    Pt received seated in bed, agreeable to PT session. No complaints of pain this date. Encouraged pt to get up to [facility] this session to continue to practice with head array controls. Pt declines any OOB mobility or to get up to [facility] due to significant fatigue due to not sleeping well last night. Pt barely able to keep eyes open to engage in conversation during session. Tested tone in BUE and BLE, see Modified Ashworth below. Pt scores elbows (4), hamstrings (1+), quads (1) and gastroc (1) bilaterally. Pt to have Baclofen pump placed tomorrow morning, will continue to assess tone. Performed BLE PROM in available planes of motion. Pt missed 30 min of scheduled therapy session due to fatigue. Pt left seated in bed with needs in reach at end of session.  Therapy Documentation Precautions:  Precautions Precautions: Fall Precaution Comments: C5 SCI, Asia A, trach Restrictions Weight Bearing Restrictions: No  Extremity Assessment  RUE Tone RUE Tone: Modified Ashworth Body Part - Modified Ashworth Scale: Elbow Elbow - Modified Ashworth Scale for Grading Hypertonia RUE: Affected part(s) rigid in flexion or extension LUE Tone LUE Tone: Modified Ashworth Body Part - Modified Ashworth Scale: Elbow Elbow -  Modified Ashworth Scale for Grading Hypertonia LUE: Affected part(s) rigid in flexion or extension RLE Tone RLE Tone: Modified Ashworth Body Part - Modified Ashworth Scale: Hamstrings;Quadriceps;Gastrocnemius Hamstrings - Modified Ashworth Scale for Grading Hypertonia RLE: Slight increase in muscle tone, manifested by a catch, followed by minimal resistance throughout the remainder (less than half) of the ROM QUADRICEPS - Modified Ashworth Scale for Grading Hypertonia RLE: Slight increase in muscle tone, manifested by a catch and release or by minimal resistance at the end of the range of motion when the affected part(s) is moved in flexion or extension GASTROCNEMIUS - Modified Ashworth Scale for Grading Hypertonia RLE: Slight increase in muscle tone, manifested by a catch and release or by minimal resistance at the end of the range of motion when the affected part(s) is moved in flexion or extension RLE Tone Comments: flexor tone in hips and knees with mobility LLE Tone LLE Tone: Modified Ashworth Body Part - Modified Ashworth Scale: Hamstrings;Quadriceps;Gastrocnemius Hamstrings - Modified Ashworth Scale for Grading Hypertonia LLE: Slight increase in muscle tone, manifested by a catch, followed by minimal resistance throughout the remainder (less than half) of the ROM Quadriceps - Modified Ashworth Scale for Grading Hypertonia LLE: Slight increase in muscle tone, manifested by a catch and release or by minimal resistance at the end of the range of motion when the affected part(s) is moved in flexion or extension Gastrocnemius - Modified Ashworth Scale for Grading Hypertonia LLE: Slight increase in muscle tone, manifested by a catch and release or by minimal resistance at the end of the range of motion when the affected part(s) is moved in flexion or  extension LLE Tone Comments: flexor tone in hips and knees with mobility      Therapy/Group: Individual Therapy   Peter Congo, PT, DPT,  CSRS  05/07/2021, 2:35 PM

## 2021-05-07 NOTE — Progress Notes (Signed)
CPT held at this time per pt request.
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
## 2021-05-07 NOTE — Progress Notes (Signed)
RT placed pt on room air per P.Love PA-C request/order. Two minutes after placing pt on room air pt desaturated to 88% and maintained. Pt in no distress, HR wnl and mild baseline tachypnea. Pt placed back on aerosol trach collar at this time. RN and P.Love PA-C notified of events. RT will continue to monitor.

## 2021-05-07 NOTE — Progress Notes (Signed)
Per P.Love PA-C  ABG still needs to be obtained. RT to place pt back on RA x30 min and will obtain ABG. RN aware of changes. RT will continue to monitor.
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
## 2021-05-08 ENCOUNTER — Encounter (HOSPITAL_COMMUNITY): Payer: Self-pay | Admitting: Interventional Radiology

## 2021-05-08 ENCOUNTER — Inpatient Hospital Stay (HOSPITAL_COMMUNITY): Payer: 59

## 2021-05-08 HISTORY — PX: IR FLUORO GUIDED NEEDLE PLC ASPIRATION/INJECTION LOC: IMG2395

## 2021-05-08 MED ORDER — BACLOFEN 0.05 MG/ML IT SOLN
100.0000 ug | Freq: Once | INTRATHECAL | Status: AC
  Administered 2021-05-08: 100 ug via INTRATHECAL
  Filled 2021-05-08: qty 2

## 2021-05-08 MED ORDER — LIDOCAINE HCL 1 % IJ SOLN
INTRAMUSCULAR | Status: AC
  Filled 2021-05-08: qty 20

## 2021-05-08 MED ORDER — CHLORHEXIDINE GLUCONATE CLOTH 2 % EX PADS
6.0000 | MEDICATED_PAD | Freq: Every day | CUTANEOUS | Status: DC
  Administered 2021-05-08: 6 via TOPICAL

## 2021-05-08 NOTE — Progress Notes (Signed)
Occupational Therapy Session Note  Patient Details  Name: Luke Wright MRN: [id number] Date of Birth: 1971-07-01  Today's Date: 05/08/2021 OT Individual Time: 1000-1100 OT Individual Time Calculation (min): 60 min    Short Term Goals: Week 2:  OT Short Term Goal 1 (Week 2): Pt will self feed with AE PRN with Max A OT Short Term Goal 2 (Week 2): Pt will brush teeth with AE with Max A OT Short Term Goal 3 (Week 2): Pt will roll L/R with 1 assist to decrease caregiver burden OT Short Term Goal 4 (Week 2): Pt will tolerate wearing B elbow splints for 2 hours with no redness/irritation  Skilled Therapeutic Interventions/Progress Updates:    Pt resting in bed upon arrival. OT intervention with focus on bed mobility and sitting balance EOB. Tot A+2 for bed mobility and sitting balance EOB. Pt initiates leaning forward with leading head but requires tot A. Pt sat EOB for approx 30 mins tolerating well until c/o HA.   BP:  Supine-129/80 Seated EOB-115/88, 97/65, 89/71, 103/66 Pt c/o HA and BP 145/96, pt returned to upright position on inflated bed and BP as follows: 141/89, 131/89, and 134/79 with pt reports that HA resolving.  RN notified.  Therapy Documentation Precautions:  Precautions Precautions: Fall Precaution Comments: C5 SCI, Asia A, trach Restrictions Weight Bearing Restrictions: No Pain: Pain Assessment Pain Scale: 0-10 Pain Score: 0-No pain Pain Location: Head Pain Descriptors / Indicators: Aching Pain Intervention(s): Medication (See eMAR)   Therapy/Group: Individual Therapy  Rich Brave 05/08/2021, 12:19 PM

## 2021-05-08 NOTE — Progress Notes (Signed)
RT NOTES: Pt not in room for 0800 trach assessment. Will check back later. 
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
## 2021-05-08 NOTE — Procedures (Signed)
Interventional Radiology Procedure Note  Procedure: L3-L4 LP with fluoro for intrathecal installation of baclofen  Complications: None  Estimated Blood Loss: None  Recommendations: - Return to floor   Signed,  Sterling Big, MD

## 2021-05-08 NOTE — Progress Notes (Signed)
Bowel program as ordered and patient tolerated well. Observed  large bowel movement after evacuation. Patient denies distress and no other headaches voiced this shift. Will continue to monitor.

## 2021-05-08 NOTE — Patient Care Conference (Signed)
Inpatient RehabilitationTeam Conference and Plan of Care Update Date: 05/08/2021   Time: 11:16 AM    Patient Name: Luke Wright      Medical Record Number: [id number]  Date of Birth: 09/15/1970 Sex: Male         Room/Bed: 4W20C/4W20C-01 Payor Info: Payor: Theme park manager MEDICARE / Plan: Charmian Muff DUAL COMPLETE / Product Type: *No Product type* /    Admit Date/Time:  04/23/2021  2:17 PM  Primary Diagnosis:  Quadriplegia, unspecified [facility])  Hospital Problems: Principal Problem:   Quadriplegia, unspecified ([place]) Active Problems:   Pressure injury of skin   Pressure injury of sacral region, unstageable ([place])   Hypoalbuminemia due to protein-calorie malnutrition ([place])   Neurogenic bowel   Acute blood loss anemia   Essential hypertension   Dysphagia   Leukocytosis   Hypokalemia   Tracheostomy care [facility])    Expected Discharge Date: Expected Discharge Date: 05/17/21  Team Members Present: Physician leading conference: Dr. Courtney Heys Social Worker Present: Loralee Pacas, [place] Nurse Present: Dorthula Nettles, RN PT Present: Excell Seltzer, PT OT Present: Roanna Epley, [place], OT SLP Present: Lillie Columbia, SLP PPS Coordinator present : Ileana Ladd, PT     Current Status/Progress Goal Weekly Team Focus  Bowel/Bladder   neurogenic bowel/bladder, I&O cath q 4 hr and bowel program nightly  family members will learn bowel program and I&O cathing  I&O cathing, bowel program   Swallow/Nutrition/ Hydration   regular/thin Mod I  Mod I  continue tolerance   ADL's   BADLs-dependent: bed mobility-tot A+2; transfers via [place]; sitting balance-tot A+2  Max/total bathe/dress, Mod self feed/oral hygiene with AE to be downgraded  education, bed mobility, sitting balance   Mobility   total A x 2 rolling and supine to/from sit, transfer via maxi move, trialed [facility] 10/21  dependent for transfers, mod I PWC, pt indep to direct family/caregivers with transfers  and pressure relief  PWC mobility, SCI education, pt and family education   Retail buyer PMSV with full staff supervision with SpO2 stats 88%-93%, tolerating regular thin no difficulty, speech intelligibility functional  mod I  hoping to d/c this week, intelligibility with PMSV functional, tolerating well   Safety/Cognition/ Behavioral Observations  WFL per informal assessment         Pain   pain in shoulders and upper arms  <3  assess pain q 4hr and prn   Skin   unstageable to sacrum and stage 2 to the left  have patient rotate sides every 2 hours        Discharge Planning:  D/c to home with his dtr who will provide intermittent care since she cares for 6 children under the age of 50 y.o. Fam mtg on 05/03/21 at 10am with pt dtr. Fam edu on TR/FRI (10/27 and 10/28) 9am-12pm and SAT/Sun 10am-12pm. Trach care education during this team to be provided. Waiting on updates from private duty nursing on when/if services can begin. SW submitted CAP/DA referral. Will establish HHA preference.   Team Discussion: Lurline Idol sounded good today, didn't need suction. Baclofen trial starting today. Botox last week to BUE, some improvement. Family conference last week, patient requesting foley. 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 Currently I&O cath q 4 hr, nightly bowel program. PRN's available for pain. Hydrotherapy now MWF, nursing to change dressing when hydrotherapy doesn't. Will set-up respiratory training for family. SW having barriers when trying to set-up Oxygen for discharge. Waiting updates on private duty nursing set-up. Family education scheduled for  Thursday, Saturday, and Sunday 9-12 PM. Referral for CAP. Patient on target to meet rehab goals: yes, tone still same at 9 AM, will assess again this afternoon. Loaner power wheelchair from [place] provided. Sitting balance total +2, does initiate coming forward. Complains of headache from sitting due to elevated blood pressure, but does resolve  with time. Met goals with SLP, tolerating regular diet and hope to discharge from SLP this week.    *See Care Plan and progress notes for long and short-term goals.   Revisions to Treatment Plan:  Baclofen trial, adjusting medications, order for foley insertion.  Teaching Needs: Family education, medication management, pain management, oxygen management, trach care education, foley care education, bowel program education, skin/wound care.  Current Barriers to Discharge: Decreased caregiver support, Home enviroment access/layout, Trach, Incontinence, Neurogenic bowel and bladder, Wound care, Lack of/limited family support, Weight, Weight bearing restrictions, Medication compliance, and New oxygen  Possible Resolutions to Barriers: Family education, set-up trach education, continue current medications, teach bowel program, and wound care to sacrum.     Medical Summary Current Status: FiO2 to 28%; sats 93%; hydrotherapy for unstageable ulcer M/W/F- dressing changes in between; B/B neurogenic- will place foley; bowel program; on lovenox  Barriers to Discharge: Decreased family/caregiver support;Home enviroment access/layout;Incontinence;Neurogenic Bowel & Bladder;Medical stability;Trach;Weight bearing restrictions;Wound care;New oxygen;Weight  Barriers to Discharge Comments: foley to be placed- pt wants; barriers- trach, large unstageable ulcer on backside; spasticity; Possible Resolutions to [employer] Focus: daughter ot learn trach care- resp therapy-- family training; challenges getting O2 for him; PMV (+) intermittent- with SLP; trying to get private duty nursing; submitted CAP referral; focus on spasticity this AM- ITB trial today- with goal of ITB pump by next week; power w/c- head control loaner gotten; some orthostatic hypotension- no Sx's- having some AD as well; SLP- tolerating PMV and regular diet;to d/c this week;  d/c initially for 11/3- but will extend 2 weeks if gets ITB  pump   Continued Need for Acute Rehabilitation Level of Care: The patient requires daily medical management by a physician with specialized training in physical medicine and rehabilitation for the following reasons: Direction of a multidisciplinary physical rehabilitation program to maximize functional independence : Yes Medical management of patient stability for increased activity during participation in an intensive rehabilitation regime.: Yes Analysis of laboratory values and/or radiology reports with any subsequent need for medication adjustment and/or medical intervention. : Yes   I attest that I was present, lead the team conference, and concur with the assessment and plan of the team.   Cristi Loron 05/08/2021, 3:09 PM

## 2021-05-08 NOTE — Progress Notes (Signed)
Physical Therapy Weekly Progress Note  Patient Details  Name: Luke Wright MRN: [id number] Date of Birth: 02/06/1971  Beginning of progress report period: May 01, 2021 End of progress report period: May 08, 2021  Today's Date: 05/08/2021  Patient has met 1 of 3 short term goals.  Pt is making good progress towards therapy goals. He remains total A x 2 for bed mobility and is dependent for transfers with maxi move due to the nature of his injury/diagnosis and sacral wounds. He has been able to initiate power wheelchair mobility over the past week and is at min A level to drive the power wheelchair via head array controls. Pt requires ongoing education regarding pressure relief and how to perform pressure relief in PWC. Pt's daughter has been present for therapy sessions and has been able to observe her dad's current level of function at this point. Over the next week she will be able to participate in hands-on training with how to assist her dad safely upon d/c home. Pt was also able to complete a Baclofen pump trial this week with hopes of decreasing tone and pain in BUE and LE.  Patient continues to demonstrate the following deficits muscle weakness, muscle joint tightness, and muscle paralysis, decreased cardiorespiratoy endurance and decreased oxygen support, abnormal tone and unbalanced muscle activation, and decreased sitting balance, decreased postural control, and decreased balance strategies and therefore will continue to benefit from skilled PT intervention to increase functional independence with mobility.  Patient progressing toward long term goals..  Continue plan of care.  PT Short Term Goals Week 2:  PT Short Term Goal 1 (Week 2): Pt will initiate power wheelchair mobility PT Short Term Goal 1 - Progress (Week 2): Met PT Short Term Goal 2 (Week 2): Pt will recall pressure relief schedule and direct caregivers in assisting him and/or performing with min cueing via PWC PT  Short Term Goal 2 - Progress (Week 2): Progressing toward goal PT Short Term Goal 3 (Week 2): Pt family will initiate hands-on family education training PT Short Term Goal 3 - Progress (Week 2): Progressing toward goal Week 3:  PT Short Term Goal 1 (Week 3): Pt will perform PWC mobility x 150 ft at Supervision level PT Short Term Goal 2 (Week 3): Pt will recall pressure relief schedule and be able to perform pressure relief in PWC with min cueing PT Short Term Goal 3 (Week 3): Pt's family to initiate hands-on family education training   Therapy Documentation Precautions:  Precautions Precautions: Fall Precaution Comments: C5 SCI, Asia A, trach Restrictions Weight Bearing Restrictions: No   Therapy/Group: Individual Therapy   Taylor Turkalo, PT, DPT, CSRS 05/08/2021, 7:37 AM  

## 2021-05-08 NOTE — Progress Notes (Signed)
Occupational Therapy Session Note  Patient Details  Name: Luke Wright MRN: [id number] Date of Birth: 11-26-1970  Today's Date: 05/08/2021 OT Individual Time: 1435-1505 OT Individual Time Calculation (min): 30 min    Short Term Goals: Week 2:  OT Short Term Goal 1 (Week 2): Pt will self feed with AE PRN with Max A OT Short Term Goal 2 (Week 2): Pt will brush teeth with AE with Max A OT Short Term Goal 3 (Week 2): Pt will roll L/R with 1 assist to decrease caregiver burden OT Short Term Goal 4 (Week 2): Pt will tolerate wearing B elbow splints for 2 hours with no redness/irritation  Skilled Therapeutic Interventions/Progress Updates:    Patient in bed, just finished with nursing care - he notes pain in shoulders - assisted with repositioning and reviewed placement of pillows which he notes relieves some of the pain.  Dependent to roll side to side for placement of sling for OH lift.  To power chair with maxi move +2.  + 2 to reposition in w/c.  Completed scapular massage, PROM and AAROM - positioned arms on pillows in preparation for PT session starting at this time.    Therapy Documentation Precautions:  Precautions Precautions: Fall Precaution Comments: C5 SCI, Asia A, trach Restrictions Weight Bearing Restrictions: No   Therapy/Group: Individual Therapy  Barrie Lyme 05/08/2021, 7:47 AM

## 2021-05-08 NOTE — Progress Notes (Addendum)
Patient ID: Luke Wright, male   DOB: 26-Oct-1970, 50 y.o.   MRN: [id number]  SW met with pt and pt dtr [name] to provide updates from team conference, and d/c date remains 11/3 at this time. SW confirmed pt will have baclofen trial and attending will confirm if he will get this. SW reviewed DME that has been ordered: trach care kits and supplies, portable suction, and hospital bed with [employer] [facility]. SW informed additional DME items such as oxygen will be ordered before d/c. SW discussed waiting on updates from [place] services to determine if their agency is able to provide patient care needs, and that he may have more than one potential nurse meet him during his stay here. SW will f/u once there are more updates.   Fam edu: Thursday (10/27) 9am-12pm Friday (10/28) 9am-12pm Sunday (10/30) 10am-12pm with pt dtr and her boyfriend  *SW received updates from attending pt d/c date now 11/12 due to being approved for baclofen pump. Pt and dtr remain in agreement with new d/c date.  SW spoke with [employer] [phone]) to inform on new d/c date. Reports that his clinical manager is reviewing patients case and seeing if able to accommodate pt care needs. Reports there are forms they need from patient dtr as well. SW spoke with pt dtr to discuss forms.   SW will continue to monitor pt for care needs.  Loralee Pacas, MSW, [place] Office: [phone] Cell: [phone] Fax: [phone]

## 2021-05-08 NOTE — Progress Notes (Signed)
Patient ID: Luke Wright, male   DOB: 06/11/1971, 50 y.o.   MRN: [id number]  Using strict aseptic technique, I injected 2 ml / 100 mcg of intrathecal baclofen into the patient's epidural space via IR performed L3/4 LP at 0830 hours. The medication went in without difficulty or any complications. Fully intact needle was withdranw after the injection and a band-aid placed over the insertion site. The patient tolerated the procedure well. [employer] rep at bedside during procedure.   Council Mechanic, DNP, NP-C 05/08/2021 8:15 AM

## 2021-05-09 ENCOUNTER — Other Ambulatory Visit: Payer: Self-pay | Admitting: Neurological Surgery

## 2021-05-09 MED ORDER — ENOXAPARIN SODIUM 40 MG/0.4ML IJ SOSY
40.0000 mg | PREFILLED_SYRINGE | INTRAMUSCULAR | Status: AC
Start: 2021-05-09 — End: 2021-05-13
  Administered 2021-05-09 – 2021-05-13 (×5): 40 mg via SUBCUTANEOUS
  Filled 2021-05-09 (×5): qty 0.4

## 2021-05-09 MED ORDER — CHLORHEXIDINE GLUCONATE CLOTH 2 % EX PADS
6.0000 | MEDICATED_PAD | Freq: Every day | CUTANEOUS | Status: DC
  Administered 2021-05-09 – 2021-05-24 (×16): 6 via TOPICAL

## 2021-05-09 NOTE — Progress Notes (Signed)
Occupational Therapy Session Note  Patient Details  Name: Luke Wright MRN: [id number] Date of Birth: Aug 21, 1970  Today's Date: 05/09/2021 OT Individual Time: 0511-0211 OT Individual Time Calculation (min): 70 min    Short Term Goals: Week 3:  OT Short Term Goal 1 (Week 3): Pt will self feed with AE PRN with Max A OT Short Term Goal 2 (Week 3): Pt will brush teeth with AE with Max A OT Short Term Goal 3 (Week 3): Pt will roll L/R with 1 assist to decrease caregiver burden  Skilled Therapeutic Interventions/Progress Updates:    Pt resting in bed upon arrival. Pt stated he rested better previous night. OT intervention with focus on bed mobility and OOB tolerance in PWC. Tot A+2 for rolling in bed to place sling for MaxiSky. Seated in w/c BP readings: 72/48 Pt reclined in PWC-96/60 Upright-67/49 Reclined-97/62  Pt remained in recliner position in [facility] RN notified Pt able to activate soft call bell.  PMSV removed  Therapy Documentation Precautions:  Precautions Precautions: Fall Precaution Comments: C5 SCI, Asia A, trach Restrictions Weight Bearing Restrictions: No Pain: Pain Assessment Pain Scale: 0-10 Pain Score: 0-No pain   Therapy/Group: Individual Therapy  Rich Brave 05/09/2021, 12:12 PM

## 2021-05-09 NOTE — Progress Notes (Signed)
Contacted pt's daughter [name]. She will contact respiratory when she arrives for her next visit to set/up a time for trach education.  The therapist covering the area was also made aware of need for trach education.  Will continue to follow progress.

## 2021-05-09 NOTE — Progress Notes (Signed)
Speech Language Pathology Daily Session Note  Patient Details  Name: Luke Wright MRN: [id number] Date of Birth: 1971-01-21  Today's Date: 05/09/2021 SLP Individual Time: 1300-1335 SLP Individual Time Calculation (min): 35 min  Short Term Goals: Week 2: SLP Short Term Goal 1 (Week 2): Patient will demonstrate 90% speech intelligibility at the sentence level with Mod I for use of speech intelligibility strategies. SLP Short Term Goal 2 (Week 2): Patient will wear PMSV without O2 saturations dropping below 90% and without reports/signs of distress throughout an entirety of a session. SLP Short Term Goal 3 (Week 2): Caregiver education will be provided to the patient's daugher so that she can independently donn/doff PMSV SLP Short Term Goal 4 (Week 2): Patient will consume current diet of regular textures with thin liquids without overt s/s of aspiration with Mod I for use of swallowing compensatory strategies.  Skilled Therapeutic Interventions:   Patient seen for skilled ST session with student observer present in room. Focus of session was on speech goals and education of patient regarding PMV. Of note, patient seen by pulmonology NP on 10/24 and hopefully can downsized to cuffless trach. Patient able to voice without PMV on during today's session, however voice with PMV on seemed a little weaker than in previous sessions. Patient reported not getting adequate sleep. SLP showed patient via mirror, his trach and PMV and reviewed donning and doffing. (Although he is not able to do this himself, SLP's goal is for patient to be able to educate family members/direct his care.). Patient was at 97% SpO2 with PMV and trach collar in place and 95% with PMV off and trach collar off.Patient continues to benefit from skilled SLP intervention to maximize speech/voice and swallow function goals prior to discharge.  Pain Pain Assessment Pain Scale: 0-10 Pain Score: 0-No pain  Therapy/Group: Individual  Therapy  Angela Nevin, MA, CCC-SLP Speech Therapy

## 2021-05-09 NOTE — Progress Notes (Signed)
Patient stated that he has been having consistent bowel movements and refused bowel program. Cletis [place], LPN

## 2021-05-09 NOTE — Progress Notes (Signed)
Physical Therapy Wound Treatment Patient Details  Name: Luke Wright MRN: [id number] Date of Birth: Jan 17, 1971  Today's Date: 05/09/2021 PT Individual Time: 0927-1013 PT Individual Time Calculation (min): 46 min   Subjective  Subjective: Pt pleasant and agreeable to hydrotherapy. Patient and Family Stated Goals: None stated Date of Onset:  (Unknown) Prior Treatments: Dressing changes; Santyl  Pain Score: Pt reporting significant pain in RUE during treatment from positioning.   Wound Assessment  Pressure Injury 04/23/21 Buttocks Left Unstageable - Full thickness tissue loss in which the base of the injury is covered by slough (yellow, tan, gray, green or brown) and/or eschar (tan, brown or black) in the wound bed. Unstageable to left buttock wit (Active)  Wound Image   05/09/21 1148  Dressing Type Barrier Film (skin prep);Foam - Lift dressing to assess site every shift;Gauze (Comment);Moist to moist;Santyl 05/09/21 1148  Dressing Changed;Clean;Dry;Intact 05/09/21 1148  Dressing Change Frequency Daily 05/09/21 1148  State of Healing Early/partial granulation 05/09/21 1148  Site / Wound Assessment Pink;Red;Yellow 05/09/21 1148  % Wound base Red or Granulating 55% 05/09/21 1148  % Wound base Yellow/Fibrinous Exudate 45% 05/09/21 1148  % Wound base Black/Eschar 0% 05/09/21 1148  % Wound base Other/Granulation Tissue (Comment) 0% 05/09/21 1148  Peri-wound Assessment Intact 05/09/21 1148  Wound Length (cm) 7.2 cm 05/09/21 1148  Wound Width (cm) 5 cm 05/09/21 1148  Wound Depth (cm) 3.5 cm 05/09/21 1148  Wound Surface Area (cm^2) 36 cm^2 05/09/21 1148  Wound Volume (cm^3) 126 cm^3 05/09/21 1148  Tunneling (cm) 0 05/09/21 1148  Undermining (cm) UP to 2.5 cm from 12:00-6:00 margin. 05/09/21 1148  Margins Unattached edges (unapproximated) 05/09/21 1148  Drainage Amount Minimal 05/09/21 1148  Drainage Description Serosanguineous 05/09/21 1148  Treatment Debridement  (Selective);Hydrotherapy (Pulse lavage);Packing (Saline gauze) 05/09/21 1148      Hydrotherapy Pulsed lavage therapy - wound location: L buttock Pulsed Lavage with Suction (psi): 12 psi Pulsed Lavage with Suction - Normal Saline Used: 1000 mL Pulsed Lavage Tip: Tip with splash shield Selective Debridement Selective Debridement - Location: L buttock Selective Debridement - Tools Used: Forceps;Scalpel Selective Debridement - Tissue Removed: Yellow unviable tissue   Wound Assessment and Plan  Wound Therapy - Assess/Plan/Recommendations Wound Therapy - Clinical Statement: Progressing with debridement and wound bed appears to be improving. Pt with continued RUE pain with positioning. He will benefit from continued hydrotherapy for selective removal of unviable tissue, to decrease bioburden, and promote wound bed healing. Wound Therapy - Functional Problem List: SCI - decreased tolerance for position changes; immobility Factors Delaying/Impairing Wound Healing: Immobility;Altered sensation Hydrotherapy Plan: Debridement;Patient/family education;Dressing change;Pulsatile lavage with suction Wound Therapy - Frequency: 6X / week Wound Therapy - Follow Up Recommendations: dressing changes by RN  Wound Therapy Goals- Improve the function of patient's integumentary system by progressing the wound(s) through the phases of wound healing (inflammation - proliferation - remodeling) by: Decrease Necrotic Tissue to: 20% Decrease Necrotic Tissue - Progress: Progressing toward goal Increase Granulation Tissue to: 80% Increase Granulation Tissue - Progress: Progressing toward goal Improve Drainage Characteristics: Min;Serous Improve Drainage Characteristics - Progress: Progressing toward goal Goals/treatment plan/discharge plan were made with and agreed upon by patient/family: Yes Time For Goal Achievement: 7 days Wound Therapy - Potential for Goals: Good  Goals will be updated until maximal potential  achieved or discharge criteria met.  Discharge criteria: when goals achieved, discharge from hospital, MD decision/surgical intervention, no progress towards goals, refusal/missing three consecutive treatments without notification or medical reason.  GP  Thelma Comp 05/09/2021, 11:58 AM  Rolinda Roan, PT, DPT Acute Rehabilitation Services Pager: [phone] Office: [phone]

## 2021-05-09 NOTE — Progress Notes (Signed)
Occupational Therapy Weekly Progress Note  Patient Details  Name: Luke Wright MRN: [id number] Date of Birth: Nov 02, 1970  Beginning of progress report period: May 01, 2021 End of progress report period: May 09, 2021  Patient has met 1 of 4 short term goals.  Pt tolerating OOB in w/c. Pt initiates rolling R/L in bed but requires tot A+2 at this time. Transfers with MaxiSky. Supine>sit EOB with tot A+2. Sitting balance EOB with tot A+2. Pt initiates leaning forward when seated EOB but unable to control or maintain sitting balance. Pt has been approved for Baclofen pump placement for spasticity management and improved BUE function. Will monitor and adjust LTG as necessary. Pt's daughter scheduled for education this week.  Patient continues to demonstrate the following deficits: muscle weakness, muscle joint tightness, and muscle paralysis, decreased cardiorespiratoy endurance and decreased oxygen support, impaired timing and sequencing, abnormal tone, unbalanced muscle activation, and decreased coordination, and decreased sitting balance, decreased standing balance, decreased postural control, and decreased balance strategies and therefore will continue to benefit from skilled OT intervention to enhance overall performance with Reduce care partner burden.  Patient progressing toward long term goals..  Continue plan of care.  OT Short Term Goals Week 2:  OT Short Term Goal 1 (Week 2): Pt will self feed with AE PRN with Max A OT Short Term Goal 1 - Progress (Week 2): Progressing toward goal OT Short Term Goal 2 (Week 2): Pt will brush teeth with AE with Max A OT Short Term Goal 2 - Progress (Week 2): Progressing toward goal OT Short Term Goal 3 (Week 2): Pt will roll L/R with 1 assist to decrease caregiver burden OT Short Term Goal 3 - Progress (Week 2): Progressing toward goal OT Short Term Goal 4 (Week 2): Pt will tolerate wearing B elbow splints for 2 hours with no  redness/irritation OT Short Term Goal 4 - Progress (Week 2): Met Week 3:  OT Short Term Goal 1 (Week 3): Pt will self feed with AE PRN with Max A OT Short Term Goal 2 (Week 3): Pt will brush teeth with AE with Max A OT Short Term Goal 3 (Week 3): Pt will roll L/R with 1 assist to decrease caregiver burden  [name] 05/09/2021, 6:41 AM
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
## 2021-05-09 NOTE — Plan of Care (Signed)
  Problem: Consults Goal: RH SPINAL CORD INJURY PATIENT EDUCATION Description:  See Patient Education module for education specifics.  Outcome: Progressing Goal: Skin Care Protocol Initiated - if Braden Score 18 or less Description: If consults are not indicated, leave blank or document N/A Outcome: Progressing Goal: Nutrition Consult-if indicated Outcome: Progressing   Problem: SCI BOWEL ELIMINATION Goal: RH STG MANAGE BOWEL WITH ASSISTANCE Description: STG Manage Bowel with Total Assistance. Outcome: Progressing Goal: RH STG SCI MANAGE BOWEL WITH MEDICATION WITH ASSISTANCE Description: STG SCI Manage bowel with medication with Total assistance. Outcome: Progressing Goal: RH STG SCI MANAGE BOWEL PROGRAM W/ASSIST OR AS APPROPRIATE Description: STG SCI Manage bowel program with Total assist or as appropriate. Outcome: Progressing   Problem: SCI BLADDER ELIMINATION Goal: RH STG MANAGE BLADDER WITH ASSISTANCE Description: STG Manage Bladder With Total Assistance Outcome: Progressing Goal: RH STG MANAGE BLADDER WITH MEDICATION WITH ASSISTANCE Description: STG Manage Bladder With Medication With Total Assistance. Outcome: Progressing   Problem: RH SKIN INTEGRITY Goal: RH STG SKIN FREE OF INFECTION/BREAKDOWN Description: Skin to remain free of breakdown/infection with total assist while on rehab. Outcome: Progressing Goal: RH STG MAINTAIN SKIN INTEGRITY WITH ASSISTANCE Description: STG Maintain Skin Integrity With Total Assistance. Outcome: Progressing Goal: RH STG ABLE TO PERFORM INCISION/WOUND CARE W/ASSISTANCE Description: STG Able To Perform Incision/Wound Care With Total Assistance. Outcome: Progressing   Problem: RH SAFETY Goal: RH STG ADHERE TO SAFETY PRECAUTIONS W/ASSISTANCE/DEVICE Description: STG Adhere to Safety Precautions With Total Assistance/Device. Outcome: Progressing Goal: RH STG DECREASED RISK OF FALL WITH ASSISTANCE Description: STG Decreased Risk of Fall  With Total Assistance. Outcome: Progressing   Problem: RH PAIN MANAGEMENT Goal: RH STG PAIN MANAGED AT OR BELOW PT'S PAIN GOAL Description: < 3 on a 0-10 pain scale. Outcome: Progressing   Problem: RH KNOWLEDGE DEFICIT SCI Goal: RH STG INCREASE KNOWLEDGE OF SELF CARE AFTER SCI Description: Patient will demonstrate knowledge of medication management, pain management, bowel/bladder program, and skin/incision care with educational materials and handouts provided by staff independently directing self-care at discharge. Outcome: Progressing

## 2021-05-09 NOTE — Progress Notes (Signed)
Pt refused CPT at this time. RT will attempt at next scheduled time. Vitals stable, RT will continue to monitor.

## 2021-05-09 NOTE — Progress Notes (Signed)
Patient ID: Luke Wright, male   DOB: [birth year], 50 y.o.   MRN: [id number]  Notified respiratory charge that patient's family will need trach care education. Family education is scheduled for Thursday, Friday, and Saturday 9-12 PM. Requested that respiratory call daughter [name], [phone], and schedule time when she would be available on those days. Will continue to monitor education.  Kennyth Arnold, RN3, BSN, CBIS, CRRN, [facility], Inpatient Rehabilitation Office [phone] Cell [phone]

## 2021-05-09 NOTE — Progress Notes (Signed)
Physical Therapy Session Note  Patient Details  Name: Luke Wright MRN: [id number] Date of Birth: Aug 29, 1970  Today's Date: 05/09/2021 PT Individual Time: 9102-8902 PT Individual Time Calculation (min): 68 min   Short Term Goals: Week 3:  PT Short Term Goal 1 (Week 3): Pt will perform PWC mobility x 150 ft at Supervision level PT Short Term Goal 2 (Week 3): Pt will recall pressure relief schedule and be able to perform pressure relief in PWC with min cueing PT Short Term Goal 3 (Week 3): Pt's family to initiate hands-on family education training  Skilled Therapeutic Interventions/Progress Updates: Pt presented in [place] with Corene Cornea from [facility] present through most of session. Session focused on adjustments of head array to allow for ease of maneuverability as well as possibly moving pressure release fob due to currently pillows blocking pt from performing activity. After adjusting head array pt noted to have some improvement of forward control without as many stops as well as some improvement in turns. After continued discussion will add reverse as well as moving pressure release  Thursday 10/27. Upon completion of adjustments, and practice in [place] pt returned to room. Pt then transferred back to bed via [facility] and performed rolling L/R total A x 2 for removal of pad. Pt positioned with pillows and soft touch pad placed behind head. Pt left in bed at end of session with phone positioned available to use and needs met.      Therapy Documentation Precautions:  Precautions Precautions: Fall Precaution Comments: C5 SCI, [place] A, trach Restrictions Weight Bearing Restrictions: No General:   Vital Signs: Oxygen Therapy SpO2: 96 % O2 Device: Tracheostomy Collar O2 Flow Rate (L/min): 5 L/min FiO2 (%): 28 % Pain: Pain Assessment Pain Scale: 0-10 Pain Score: 0-No pain Mobility:   Locomotion :    Trunk/Postural Assessment :    Balance:   Exercises:   Other Treatments:       Therapy/Group: Individual Therapy  Italo Banton 05/09/2021, 4:07 PM

## 2021-05-10 NOTE — Progress Notes (Signed)
Occupational Therapy Session Note  Patient Details  Name: Luke Wright MRN: [id number] Date of Birth: [birth year]  Today's Date: 05/10/2021 OT Individual Time: 0930-1030 OT Individual Time Calculation (min): 60 min  and Today's Date: 05/10/2021 OT Missed Time: 15 Minutes Missed Time Reason: Nursing care   Short Term Goals: Week 3:  OT Short Term Goal 1 (Week 3): Pt will self feed with AE PRN with Max A OT Short Term Goal 2 (Week 3): Pt will brush teeth with AE with Max A OT Short Term Goal 3 (Week 3): Pt will roll L/R with 1 assist to decrease caregiver burden  Skilled Therapeutic Interventions/Progress Updates:    Pt resting in bed upon arrival. Pt incontinent of large bowel movement. Pt unaware. PT couldn't recall if he had bowel program previous night. Pt's wound dressing soiled and charge nurse Lavina Hamman) attended to pt with assistance from OTA and Rehab Tech. OT intervention with focus on bed mobility to facilitate hygiene and wound care. Educated pt on importance of bowel program to prevent unscheduled BMs. Pt verbalzied understanding. Pt remained in bed with all needs within reach. Pt able to activate soft call bell.   Therapy Documentation Precautions:  Precautions Precautions: Fall Precaution Comments: C5 SCI, Asia A, trach Restrictions Weight Bearing Restrictions: Yes General: General OT Amount of Missed Time: 15 Minutes Pain: Pt c/o RUE pain during the night 2/2 staff not repositioning during the night. Charge nurse notified  Therapy/Group: Individual Therapy  Rich Brave 05/10/2021, 10:41 AM

## 2021-05-10 NOTE — Consult Note (Addendum)
WOC Nurse wound follow up Patient receiving care in [facility] [id number] Wound type: Unstageable PI to left gluteal. Current treatment: Hydrotherapy 3 x week on M/W/F followed by Santyl and moistened saline gauze and sacral foam. Updated photo taken yesterday by PT and at this time the current treatment will remain as is. The WOC team will re-evaluate prior to discharge or sooner if consulted by PT team.  Please re-consult if needed. PHOTOS NOT TAKEN BY [facility] TEAM  04/23/21  04/26/21  05/03/21  05/09/21   Chip Boer L. Katrinka Blazing, MSN, RN, CMSRN, Angus Seller, [facility] Wound Treatment Associate Pager [phone]
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
## 2021-05-10 NOTE — Progress Notes (Signed)
Occupational Therapy Session Note  Patient Details  Name: Luke Wright MRN: [id number] Date of Birth: Aug 06, 1970  Today's Date: 05/10/2021 OT Individual Time: 1530-1600 OT Individual Time Calculation (min): 30 min    Short Term Goals: Week 1:  OT Short Term Goal 1 (Week 1): Pt will self feed with AE PRN with Max A OT Short Term Goal 1 - Progress (Week 1): Progressing toward goal OT Short Term Goal 2 (Week 1): Pt will brush teeth with AE with Max A OT Short Term Goal 2 - Progress (Week 1): Progressing toward goal OT Short Term Goal 3 (Week 1): Pt will roll L/R with 1 assist to decrease caregiver burden OT Short Term Goal 3 - Progress (Week 1): Progressing toward goal OT Short Term Goal 4 (Week 1): Pt will tolerate wearing B elbow splints for 2 hours with no redness/irritation OT Short Term Goal 4 - Progress (Week 1): Progressing toward goal  Skilled Therapeutic Interventions/Progress Updates:     Pt received in bed with no pain reported just needed repositioning of RUE for comfort.  Therapeutic activity Pt daughter present throughout session and OT provided education on Puck universal remote, voice control as well as modular hose phone holder. Handout and explanation provided to daughter. Initially OT had planned to have pt practice with PUCK remote. Installed app on phone, however all remotes without battery. OT demo what remote would look like on phone and reviewed what shoe numbers feature with voice control could do on the remote with both pt and daughter. Will get new remote and have pt practice.   Pt left at end of session in [place] with exit alarm on, call light in reach and all needs met   Therapy Documentation Precautions:  Precautions Precautions: Fall Precaution Comments: C5 SCI, [place] A, trach Restrictions Weight Bearing Restrictions: Yes General:   Vital Signs: Therapy Vitals Temp: 97.9 F (36.6 C) Temp Source: Oral Pulse Rate: 72 Resp: 18 BP: 111/65 Patient  Position (if appropriate): Lying Oxygen Therapy SpO2: 100 % O2 Device: Tracheostomy Collar O2 Flow Rate (L/min): 8 L/min FiO2 (%): 35 %   Therapy/Group: Individual Therapy  Tonny Branch 05/10/2021, 6:55 AM

## 2021-05-10 NOTE — Progress Notes (Signed)
Pt refused CPT at this time. RT will attempt at next scheduled time. Vitals stable, RT will continue to monitor.  

## 2021-05-10 NOTE — Progress Notes (Signed)
Speech Language Pathology Discharge Summary  Patient Details  Name: Luke Wright MRN: [id number] Date of Birth: 09-07-1970  Today's Date: 05/10/2021 SLP Individual Time: 1215-1240 SLP Individual Time Calculation (min): 25 min   Skilled Therapeutic Interventions:  Skilled treatment session focused on completion of family education with the patient and his daughter. SLP facilitated session by providing verbal and demonstration cues on how to appropriately donn/doff the PSMV. SLP also provided education regarding the function, usage and cleaning of the PMSV. SLP observed patient's daughter feeding the patient his lunch of regular textures with thin liquids. Patient able to direct his care regarding bolus size and rate of feeding. Handouts were given to reinforce information. Due to patient meeting all of his SLP goals and completion of family education, patient will be discharged from skilled SLP intervention. Both the patient and hid daughter verbalize understanding and agreement.   Patient has met 4 of 4 long term goals.  Patient to discharge at overall Modified Independent;Supervision level.   Reasons goals not met: N/A   Clinical Impression/Discharge Summary: Patient has made excellent gains and has met 4 of 4 LTGs this admission. Currently, patient is consuming regular textures with thin liquids with minimal overt s/s of aspiration and is overall Mod I for directing caregivers regarding current swallowing compensatory strategies. Patient can independently direct caregivers on how to donn/doff his PMSV and is 100% intelligible at the conversation level. Patient and family education is complete and patient's current discharge plan is home with 24 hour supervision from family. Patient has met all his SLP goals, therefore, will be discharged from skilled SLP intervention with f/u not warranted at this time.   Care Partner:  Caregiver Able to Provide Assistance: Yes   Recommendation:  None      Equipment: PMSV   Reasons for discharge: Treatment goals met   Patient/Family Agrees with Progress Made and Goals Achieved: Yes    [place], [place] 05/10/2021, 6:40 AM

## 2021-05-10 NOTE — Progress Notes (Signed)
Patient ID: Luke Wright, male   DOB: 1971/03/31, 50 y.o.   MRN: [id number]  SW spoke with pt dtr who reported preferred [facility] is [facility]. SW informed will explore options, and if unable to accept will work on an agency that can accept. Confirms she will be going out of town this weekend and will return on Sunday. Fam edu will be moved to 1pm-3pm.  [name], MSW, LCSWA Office: [phone] Cell: [phone] Fax: [phone]

## 2021-05-10 NOTE — Progress Notes (Signed)
Physical Therapy Session Note  Patient Details  Name: Luke Wright MRN: [id number] Date of Birth: 09/29/1970  Today's Date: 05/10/2021 PT Individual Time: 1045-1200 PT Individual Time Calculation (min): 75 min   Short Term Goals: Week 3:  PT Short Term Goal 1 (Week 3): Pt will perform PWC mobility x 150 ft at Supervision level PT Short Term Goal 2 (Week 3): Pt will recall pressure relief schedule and be able to perform pressure relief in PWC with min cueing PT Short Term Goal 3 (Week 3): Pt's family to initiate hands-on family education training  Skilled Therapeutic Interventions/Progress Updates: Pt presented in bed agreeable to therapy. Family ed to be started today however dgt unavailable until later due to family concerns. Session therefore shifted to continued practice with PWC. Jason from [employer] present to adjust pressure release button as well as add reverse control. Once completed pt transferred to PWC via [employer]. Pt demonstrated improved control in forward and was able to maintain forward motion while turning several times. Pressure relief also performed with fob now placed at back of arm rest. Pt was able to demonstrate turning pressure relief on/off several times with minA as well as stopping in varying positions with min cues. Pt does continue to require practice with PWC forward, turning, and now reverse. Pt was able to steer into room but did require assist to turn in room. Will continue to work on managing smaller spaces. Upon return to room pt positioned with pillows to decrease elbow contracture. Pt advised that nsg will have to return pt to bed with pt verbalizing understanding. Pt left in PWC at end of session with soft touch bell within reach, PWC off, and current needs met.      Therapy Documentation Precautions:  Precautions Precautions: Fall Precaution Comments: C5 SCI, [place] A, trach Restrictions Weight Bearing Restrictions: Yes General:   Vital Signs: Oxygen  Therapy SpO2: 99 % O2 Device: Tracheostomy Collar O2 Flow Rate (L/min): 8 L/min FiO2 (%): 35 % Pain:   Mobility:   Locomotion :    Trunk/Postural Assessment :    Balance:   Exercises:   Other Treatments:      Therapy/Group: Individual Therapy  Rosita DeChalus 05/10/2021, 3:56 PM  

## 2021-05-11 DIAGNOSIS — G8250 Quadriplegia, unspecified: Secondary | ICD-10-CM | POA: Diagnosis not present

## 2021-05-11 NOTE — Plan of Care (Signed)
  Problem: Consults Goal: RH SPINAL CORD INJURY PATIENT EDUCATION Description:  See Patient Education module for education specifics.  Outcome: Progressing Goal: Skin Care Protocol Initiated - if Braden Score 18 or less Description: If consults are not indicated, leave blank or document N/A Outcome: Progressing Goal: Nutrition Consult-if indicated Outcome: Progressing   Problem: SCI BOWEL ELIMINATION Goal: RH STG MANAGE BOWEL WITH ASSISTANCE Description: STG Manage Bowel with Total Assistance. Outcome: Progressing Goal: RH STG SCI MANAGE BOWEL WITH MEDICATION WITH ASSISTANCE Description: STG SCI Manage bowel with medication with Total assistance. Outcome: Progressing Goal: RH STG SCI MANAGE BOWEL PROGRAM W/ASSIST OR AS APPROPRIATE Description: STG SCI Manage bowel program with Total assist or as appropriate. Outcome: Progressing   Problem: SCI BLADDER ELIMINATION Goal: RH STG MANAGE BLADDER WITH ASSISTANCE Description: STG Manage Bladder With Total Assistance Outcome: Progressing Goal: RH STG MANAGE BLADDER WITH MEDICATION WITH ASSISTANCE Description: STG Manage Bladder With Medication With Total Assistance. Outcome: Progressing   Problem: RH SKIN INTEGRITY Goal: RH STG SKIN FREE OF INFECTION/BREAKDOWN Description: Skin to remain free of breakdown/infection with total assist while on rehab. Outcome: Progressing Goal: RH STG MAINTAIN SKIN INTEGRITY WITH ASSISTANCE Description: STG Maintain Skin Integrity With Total Assistance. Outcome: Progressing Goal: RH STG ABLE TO PERFORM INCISION/WOUND CARE W/ASSISTANCE Description: STG Able To Perform Incision/Wound Care With Total Assistance. Outcome: Progressing   Problem: RH SAFETY Goal: RH STG ADHERE TO SAFETY PRECAUTIONS W/ASSISTANCE/DEVICE Description: STG Adhere to Safety Precautions With Total Assistance/Device. Outcome: Progressing Goal: RH STG DECREASED RISK OF FALL WITH ASSISTANCE Description: STG Decreased Risk of Fall  With Total Assistance. Outcome: Progressing   Problem: RH PAIN MANAGEMENT Goal: RH STG PAIN MANAGED AT OR BELOW PT'S PAIN GOAL Description: < 3 on a 0-10 pain scale. Outcome: Progressing   Problem: RH KNOWLEDGE DEFICIT SCI Goal: RH STG INCREASE KNOWLEDGE OF SELF CARE AFTER SCI Description: Patient will demonstrate knowledge of medication management, pain management, bowel/bladder program, and skin/incision care with educational materials and handouts provided by staff independently directing self-care at discharge. Outcome: Progressing
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
## 2021-05-11 NOTE — Progress Notes (Signed)
Patient in therapy at this time. RT will check back when patient returns.
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
## 2021-05-11 NOTE — Progress Notes (Signed)
Hydrotherapy informed nursing that starting next week patient will try to get therapy between 10-11 A. Therapy informed. Schedule to be adjusted accordingly. Cletis Media, LPN

## 2021-05-11 NOTE — Progress Notes (Signed)
This nurse and the nurse tech went into this patients room multiple times last night and offered to have him turned in bed and, reminded this patient that he was scheduled for q2 turns. This patient refused to be turned. Charge nurse was made aware and patient was educated.

## 2021-05-11 NOTE — Progress Notes (Signed)
Physical Therapy Wound Treatment Patient Details  Name: Luke Wright MRN: [id number] Date of Birth: 02-26-1971  Today's Date: 05/11/2021 PT Individual Time: 1410-1447 PT Individual Time Calculation (min): 37 min   Subjective  Subjective: Pt pleasant and agreeable to hydrotherapy. Patient and Family Stated Goals: None stated Date of Onset:  (Unknown) Prior Treatments: Dressing changes; Santyl  Pain Score: Pain Score: 0-No pain  Wound Assessment  Pressure Injury 04/23/21 Buttocks Left Unstageable - Full thickness tissue loss in which the base of the injury is covered by slough (yellow, tan, gray, green or brown) and/or eschar (tan, brown or black) in the wound bed. Unstageable to left buttock wit (Active)  Wound Image   05/09/21 1148  Dressing Type Barrier Film (skin prep);Foam - Lift dressing to assess site every shift;Gauze (Comment);Moist to dry;Santyl 05/11/21 1457  Dressing Changed;Clean;Dry;Intact 05/11/21 1457  Dressing Change Frequency Daily 05/11/21 1457  State of Healing Early/partial granulation 05/11/21 1457  Site / Wound Assessment Clean;Red;Pink 05/11/21 1457  % Wound base Red or Granulating 60% 05/11/21 1457  % Wound base Yellow/Fibrinous Exudate 15% 05/11/21 1457  % Wound base Black/Eschar 0% 05/11/21 1457  % Wound base Other/Granulation Tissue (Comment) 25% 05/11/21 1457  Peri-wound Assessment Intact 05/11/21 1457  Wound Length (cm) 7.2 cm 05/09/21 1148  Wound Width (cm) 5 cm 05/09/21 1148  Wound Depth (cm) 3.5 cm 05/09/21 1148  Wound Surface Area (cm^2) 36 cm^2 05/09/21 1148  Wound Volume (cm^3) 126 cm^3 05/09/21 1148  Tunneling (cm) 0 05/09/21 1148  Undermining (cm) UP to 2.5 cm from 12:00-6:00 margin. 05/09/21 1148  Margins Unattached edges (unapproximated) 05/11/21 1457  Drainage Amount Minimal 05/11/21 1457  Drainage Description Serous;Sanguineous 05/11/21 1457  Treatment Cleansed;Debridement (Selective);Hydrotherapy (Pulse lavage);Packing (Plain strip)  05/11/21 1457      Hydrotherapy Pulsed lavage therapy - wound location: L buttock Pulsed Lavage with Suction (psi): 12 psi (4-12) Pulsed Lavage with Suction - Normal Saline Used: 1000 mL Pulsed Lavage Tip: Tip with splash shield Selective Debridement Selective Debridement - Location: L buttock Selective Debridement - Tools Used: Forceps;Scalpel Selective Debridement - Tissue Removed: Yellow unviable tissue   Wound Assessment and Plan  Wound Therapy - Assess/Plan/Recommendations Wound Therapy - Clinical Statement: Progressing with debridement and wound bed appears to be improving. Pt with continued RUE pain with positioning. He will benefit from continued hydrotherapy for selective removal of unviable tissue, to decrease bioburden, and promote wound bed healing. Wound Therapy - Functional Problem List: SCI - decreased tolerance for position changes; immobility Factors Delaying/Impairing Wound Healing: Immobility;Altered sensation Hydrotherapy Plan: Debridement;Patient/family education;Dressing change;Pulsatile lavage with suction Wound Therapy - Frequency: 6X / week Wound Therapy - Current Recommendations: PT Wound Therapy - Follow Up Recommendations: dressing changes by RN  Wound Therapy Goals- Improve the function of patient's integumentary system by progressing the wound(s) through the phases of wound healing (inflammation - proliferation - remodeling) by: Decrease Necrotic Tissue to: 20% Decrease Necrotic Tissue - Progress: Progressing toward goal Increase Granulation Tissue to: 80% Increase Granulation Tissue - Progress: Progressing toward goal Improve Drainage Characteristics: Min;Serous Improve Drainage Characteristics - Progress: Progressing toward goal Goals/treatment plan/discharge plan were made with and agreed upon by patient/family: Yes Time For Goal Achievement: 7 days Wound Therapy - Potential for Goals: Good  Goals will be updated until maximal potential achieved or  discharge criteria met.  Discharge criteria: when goals achieved, discharge from hospital, MD decision/surgical intervention, no progress towards goals, refusal/missing three consecutive treatments without notification or medical reason.  GP  Tessie Fass [place] Ndiaye 05/11/2021, 3:17 PM 05/11/2021  Ginger Carne., PT Acute Rehabilitation Services [phone]  (pager) [phone]  (office)

## 2021-05-11 NOTE — Progress Notes (Signed)
Occupational Therapy Session Note  Patient Details  Name: Luke Wright MRN: [id number] Date of Birth: 1971/01/12  Today's Date: 05/11/2021 OT Individual Time: 1000-1100 OT Individual Time Calculation (min): 60 min    Short Term Goals: Week 3:  OT Short Term Goal 1 (Week 3): Pt will self feed with AE PRN with Max A OT Short Term Goal 2 (Week 3): Pt will brush teeth with AE with Max A OT Short Term Goal 3 (Week 3): Pt will roll L/R with 1 assist to decrease caregiver burden  Skilled Therapeutic Interventions/Progress Updates:    Pt resting in bed upon arrival. Pt reports that he thinks he had "another" bowel movement. Pt incontinent of bowel. Pt reports that he also had a BM earlier in the morning. Charge nurse notified for wound care and change of dressing on buttocks. OT intervention with focus on bed mobility to facilitate hygiene and changing of dressings. Pt tot A+2 for rolling R/L and maintaining sidelying position. Thigh high Ted hose and ACE wraps donned. Pt remained in bed. Pt able to activate soft call bell.   Therapy Documentation Precautions:  Precautions Precautions: Fall Precaution Comments: C5 SCI, [place] A, trach Restrictions Weight Bearing Restrictions: Yes Pain:  Pt resports that BUE are more painful and sensitive this morning, espcially with bed mobility; repositioned   Therapy/Group: Individual Therapy  Rich Brave 05/11/2021, 12:24 PM
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
## 2021-05-11 NOTE — Progress Notes (Signed)
Physical Therapy Session Note  Patient Details  Name: Luke Wright MRN: [id number] Date of Birth: 01/17/1971  Today's Date: 05/11/2021 PT Individual Time: 1105-1200 and 1300-1355 PT Individual Time Calculation (min): 55 min   Short Term Goals: Week 3:  PT Short Term Goal 1 (Week 3): Pt will perform PWC mobility x 150 ft at Supervision level PT Short Term Goal 2 (Week 3): Pt will recall pressure relief schedule and be able to perform pressure relief in PWC with min cueing PT Short Term Goal 3 (Week 3): Pt's family to initiate hands-on family education training  Skilled Therapeutic Interventions/Progress Updates: Pt presented in bed agreeable to therapy. Pt recently changed from bowel incontinence and prepared with TED hose and ace bandages. Performed rolling L/R total A x 2 for placement of Maxi Sky pad. Pt with mechanical lift transfer to PWC. Pt then participated in PWC navigation in hallways and to day room to obtain snacks from fall festival. Pt was able to perform near zero degree turn near snack bar without hitting table! Pt does continue to have difficulty with reverse and attempted several pressure relief trials however possible malfunction with fob as PTA had to apply significant pressure and required several attempts. PTA will continue to trouble shoot next session and if continued difficulties will contact Jason from [employer]. Pt navigated back to room remained in PWC at end of session with soft touch bell in placed and current needs met.   Tx2: Pt presented in PWC agreeable to therapy. Nsg students in room taking vitals, inaccurate reading initially therefore PTA assisted in positioning pt's LUE to take on arm vs LE's.  Session focused on continued use of PWC. Pt attempting to use dongle for pressure relief however continues to require +2 hands from PTA to activate and pt noted to be able to reach dongle and press but does not activate. PTA to contact Jason and [facility] and update primary  PT. Pt also expressing frustration at use of reverse on head array, explained there is a learning curve but will discuss with primary PT about removing. Pt transferred back to bed via mechanical lift and pt rolled total A x 2 for removal of lift pad. Pt positioned to comfort and left with current needs met awaiting PT for hydro session.      Therapy Documentation Precautions:  Precautions Precautions: Fall Precaution Comments: C5 SCI, Asia A, trach Restrictions Weight Bearing Restrictions: Yes General:   Vital Signs:   Pain: Pain Assessment Pain Scale: 0-10 Pain Score: 0-No pain      Therapy/Group: Individual Therapy  Rosita DeChalus Rosita DeChalus, PTA  05/11/2021, 2:15 PM  
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
## 2021-05-12 NOTE — Progress Notes (Signed)
RT NOTES: 1400 CPT held at this time. Pt wants to sleep.

## 2021-05-12 NOTE — Progress Notes (Signed)
RT NOTES: 0800 CPT held at this time. Pt wants to sleep.

## 2021-05-13 NOTE — Progress Notes (Signed)
Physical Therapy Session Note  Patient Details  Name: Luke Wright MRN: [id number] Date of Birth: [birth year]  Today's Date: 05/13/2021 PT Individual Time: 1400-1500 PT Individual Time Calculation (min): 60 min   Short Term Goals: Week 3:  PT Short Term Goal 1 (Week 3): Pt will perform PWC mobility x 150 ft at Supervision level PT Short Term Goal 2 (Week 3): Pt will recall pressure relief schedule and be able to perform pressure relief in PWC with min cueing PT Short Term Goal 3 (Week 3): Pt's family to initiate hands-on family education training  Skilled Therapeutic Interventions/Progress Updates:    Pt received seated in bed, agreeable to PT session. Pt reports ongoing pain in BUE that increases with mobility, not rated and declines intervention. Pt's daughter Luke Wright and son-in-law Luke Wright present for hands-on family education session. Reviewed how to assist pt with rolling L/R in bed, place manual hoyer sling, transfer to PWC via manual hoyer, and safely position in PWC. Demonstrated how to remove/place sling while seated in PWC and how to use hoyer to transfer back to bed. Reviewed a few controls on PWC including on/off, drive function vs chair position change function. Pt's family demonstrates good ability to safely assist him with transfers bed to/from chair via lift. Pt left semi-reclined in bed with needs in reach, family present at end of session.  Therapy Documentation Precautions:  Precautions Precautions: Fall Precaution Comments: C5 SCI, Asia A, trach Restrictions Weight Bearing Restrictions: Yes     Therapy/Group: Individual Therapy   Peter Congo, PT, DPT, CSRS  05/13/2021, 4:53 PM
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
## 2021-05-13 NOTE — Progress Notes (Addendum)
Occupational Therapy Session Note  Patient Details  Name: Luke Wright MRN: [id number] Date of Birth: 1971-05-08  Today's Date: 05/13/2021 OT Individual Time: 1300-1355 OT Individual Time Calculation (min): 55 min    Short Term Goals: Week 3:  OT Short Term Goal 1 (Week 3): Pt will self feed with AE PRN with Max A OT Short Term Goal 2 (Week 3): Pt will brush teeth with AE with Max A OT Short Term Goal 3 (Week 3): Pt will roll L/R with 1 assist to decrease caregiver burden  Skilled Therapeutic Interventions/Progress Updates:    Pt resting in bed upon arrival. OTA removed BUE elbow splints and BLE PRAFOs. Thigh high Ted hose and ACE wraps donned on BLE. Pt's daughter not present for education. OTA brought manual hoyer into room and educated pt on operation. Pt stated it was just like a device to remove engines from vehicles. BUE PROM at shoulder and elbow. Demonstrated dorsal wrist support w/ ucuff to be used for self feeding. Pt optimistic that after Baclofen pump in place, he will be able to participate in self feeding. Pt remained in bed with all needs within reach. Pt's daughter not present for education.  Therapy Documentation Precautions:  Precautions Precautions: Fall Precaution Comments: C5 SCI, Asia A, trach Restrictions Weight Bearing Restrictions: Yes Pain: Pain Assessment Pain Scale: 0-10 Pain Score: 0-No pain   Therapy/Group: Individual Therapy  Rich Brave 05/13/2021, 2:01 PM

## 2021-05-13 NOTE — Plan of Care (Signed)
  Problem: Consults Goal: RH SPINAL CORD INJURY PATIENT EDUCATION Description:  See Patient Education module for education specifics.  Outcome: Progressing Goal: Skin Care Protocol Initiated - if Braden Score 18 or less Description: If consults are not indicated, leave blank or document N/A Outcome: Progressing Goal: Nutrition Consult-if indicated Outcome: Progressing   Problem: SCI BOWEL ELIMINATION Goal: RH STG MANAGE BOWEL WITH ASSISTANCE Description: STG Manage Bowel with Total Assistance. Outcome: Progressing Goal: RH STG SCI MANAGE BOWEL WITH MEDICATION WITH ASSISTANCE Description: STG SCI Manage bowel with medication with Total assistance. Outcome: Progressing Goal: RH STG SCI MANAGE BOWEL PROGRAM W/ASSIST OR AS APPROPRIATE Description: STG SCI Manage bowel program with Total assist or as appropriate. Outcome: Progressing   Problem: SCI BLADDER ELIMINATION Goal: RH STG MANAGE BLADDER WITH ASSISTANCE Description: STG Manage Bladder With Total Assistance Outcome: Progressing Goal: RH STG MANAGE BLADDER WITH MEDICATION WITH ASSISTANCE Description: STG Manage Bladder With Medication With Total Assistance. Outcome: Progressing   Problem: RH SKIN INTEGRITY Goal: RH STG SKIN FREE OF INFECTION/BREAKDOWN Description: Skin to remain free of breakdown/infection with total assist while on rehab. Outcome: Progressing Goal: RH STG MAINTAIN SKIN INTEGRITY WITH ASSISTANCE Description: STG Maintain Skin Integrity With Total Assistance. Outcome: Progressing Goal: RH STG ABLE TO PERFORM INCISION/WOUND CARE W/ASSISTANCE Description: STG Able To Perform Incision/Wound Care With Total Assistance. Outcome: Progressing   Problem: RH SAFETY Goal: RH STG ADHERE TO SAFETY PRECAUTIONS W/ASSISTANCE/DEVICE Description: STG Adhere to Safety Precautions With Total Assistance/Device. Outcome: Progressing Goal: RH STG DECREASED RISK OF FALL WITH ASSISTANCE Description: STG Decreased Risk of Fall  With Total Assistance. Outcome: Progressing   Problem: RH PAIN MANAGEMENT Goal: RH STG PAIN MANAGED AT OR BELOW PT'S PAIN GOAL Description: < 3 on a 0-10 pain scale. Outcome: Progressing   Problem: RH KNOWLEDGE DEFICIT SCI Goal: RH STG INCREASE KNOWLEDGE OF SELF CARE AFTER SCI Description: Patient will demonstrate knowledge of medication management, pain management, bowel/bladder program, and skin/incision care with educational materials and handouts provided by staff independently directing self-care at discharge. Outcome: Progressing

## 2021-05-14 DIAGNOSIS — G8250 Quadriplegia, unspecified: Secondary | ICD-10-CM | POA: Diagnosis not present

## 2021-05-14 LAB — BASIC METABOLIC PANEL
Anion gap: 8 (ref 5–15)
BUN: 10 mg/dL (ref 6–20)
CO2: 26 mmol/L (ref 22–32)
Calcium: 9.2 mg/dL (ref 8.9–10.3)
Chloride: 100 mmol/L (ref 98–111)
Creatinine, Ser: 0.51 mg/dL — ABNORMAL LOW (ref 0.61–1.24)
GFR, Estimated: >60 mL/min (ref >60)
Glucose, Bld: 101 mg/dL — ABNORMAL HIGH (ref 70–99)
Potassium: 3.9 mmol/L (ref 3.5–5.1)
Sodium: 134 mmol/L — ABNORMAL LOW (ref 135–145)

## 2021-05-14 LAB — CBC WITH DIFFERENTIAL/PLATELET
Abs Immature Granulocytes: 0.03 10*3/uL (ref 0.00–0.07)
Basophils Absolute: 0 10*3/uL (ref 0.0–0.1)
Basophils Relative: 0 %
Eosinophils Absolute: 0.2 10*3/uL (ref 0.0–0.5)
Eosinophils Relative: 2 %
HCT: 34.4 % — ABNORMAL LOW (ref 39.0–52.0)
Hemoglobin: 10.7 g/dL — ABNORMAL LOW (ref 13.0–17.0)
Immature Granulocytes: 0 %
Lymphocytes Relative: 30 %
Lymphs Abs: 3.3 10*3/uL (ref 0.7–4.0)
MCH: 26.6 pg (ref 26.0–34.0)
MCHC: 31.1 g/dL (ref 30.0–36.0)
MCV: 85.4 fL (ref 80.0–100.0)
Monocytes Absolute: 0.9 10*3/uL (ref 0.1–1.0)
Monocytes Relative: 9 %
Neutro Abs: 6.3 10*3/uL (ref 1.7–7.7)
Neutrophils Relative %: 59 %
Platelets: 307 10*3/uL (ref 150–400)
RBC: 4.03 MIL/uL — ABNORMAL LOW (ref 4.22–5.81)
RDW: 17.1 % — ABNORMAL HIGH (ref 11.5–15.5)
WBC: 10.8 10*3/uL — ABNORMAL HIGH (ref 4.0–10.5)
nRBC: 0 % (ref 0.0–0.2)

## 2021-05-14 NOTE — Progress Notes (Signed)
Occupational Therapy Session Note  Patient Details  Name: YONIEL ARKWRIGHT MRN: [id number] Date of Birth: [birth year]  Today's Date: 05/14/2021 OT Individual Time: 4196-2229 OT Individual Time Calculation (min): 20 min    Short Term Goals: Week 3:  OT Short Term Goal 1 (Week 3): Pt will self feed with AE PRN with Max A OT Short Term Goal 2 (Week 3): Pt will brush teeth with AE with Max A OT Short Term Goal 3 (Week 3): Pt will roll L/R with 1 assist to decrease caregiver burden  Skilled Therapeutic Interventions/Progress Updates:    Pt resting in bed upon arrival with eyes closed. Pt states he hasn't been able to get any sleep the past few nights. Thigh high Ted hose and ACE wraps donned on BLE. Pt continues to fall asleep during this process. Pt unable to keep eyes open to engage in therapy. Pt remained in bed with all needs within reach. Pt missed 40 mins skilled OT services 2/2 fatigue/lethargy. Will attempt to make up time as able.   Therapy Documentation Precautions:  Precautions Precautions: Fall Precaution Comments: C5 SCI, Asia A, trach Restrictions Weight Bearing Restrictions: Yes General: General OT Amount of Missed Time: 40 Minutes Pain:  Pt denies pain this morning    Therapy/Group: Individual Therapy  Rich Brave 05/14/2021, 8:58 AM

## 2021-05-14 NOTE — Plan of Care (Signed)
  Problem: Consults Goal: RH SPINAL CORD INJURY PATIENT EDUCATION Description:  See Patient Education module for education specifics.  Outcome: Progressing Goal: Skin Care Protocol Initiated - if Braden Score 18 or less Description: If consults are not indicated, leave blank or document N/A Outcome: Progressing Goal: Nutrition Consult-if indicated Outcome: Progressing   Problem: SCI BOWEL ELIMINATION Goal: RH STG MANAGE BOWEL WITH ASSISTANCE Description: STG Manage Bowel with Total Assistance. Outcome: Progressing Goal: RH STG SCI MANAGE BOWEL WITH MEDICATION WITH ASSISTANCE Description: STG SCI Manage bowel with medication with Total assistance. Outcome: Progressing Goal: RH STG SCI MANAGE BOWEL PROGRAM W/ASSIST OR AS APPROPRIATE Description: STG SCI Manage bowel program with Total assist or as appropriate. Outcome: Progressing   Problem: SCI BLADDER ELIMINATION Goal: RH STG MANAGE BLADDER WITH ASSISTANCE Description: STG Manage Bladder With Total Assistance Outcome: Progressing Goal: RH STG MANAGE BLADDER WITH MEDICATION WITH ASSISTANCE Description: STG Manage Bladder With Medication With Total Assistance. Outcome: Progressing   Problem: RH SKIN INTEGRITY Goal: RH STG SKIN FREE OF INFECTION/BREAKDOWN Description: Skin to remain free of breakdown/infection with total assist while on rehab. Outcome: Progressing Goal: RH STG MAINTAIN SKIN INTEGRITY WITH ASSISTANCE Description: STG Maintain Skin Integrity With Total Assistance. Outcome: Progressing Goal: RH STG ABLE TO PERFORM INCISION/WOUND CARE W/ASSISTANCE Description: STG Able To Perform Incision/Wound Care With Total Assistance. Outcome: Progressing   Problem: RH SAFETY Goal: RH STG ADHERE TO SAFETY PRECAUTIONS W/ASSISTANCE/DEVICE Description: STG Adhere to Safety Precautions With Total Assistance/Device. Outcome: Progressing Goal: RH STG DECREASED RISK OF FALL WITH ASSISTANCE Description: STG Decreased Risk of Fall  With Total Assistance. Outcome: Progressing   Problem: RH PAIN MANAGEMENT Goal: RH STG PAIN MANAGED AT OR BELOW PT'S PAIN GOAL Description: < 3 on a 0-10 pain scale. Outcome: Progressing   Problem: RH KNOWLEDGE DEFICIT SCI Goal: RH STG INCREASE KNOWLEDGE OF SELF CARE AFTER SCI Description: Patient will demonstrate knowledge of medication management, pain management, bowel/bladder program, and skin/incision care with educational materials and handouts provided by staff independently directing self-care at discharge. Outcome: Progressing

## 2021-05-14 NOTE — Anesthesia Preprocedure Evaluation (Addendum)
Anesthesia Evaluation  Patient identified by MRN, date of birth, ID band Patient awake    Reviewed: Allergy & Precautions, NPO status , Patient's Chart, lab work & pertinent test results, reviewed documented beta blocker date and time   Airway Mallampati: Trach       Dental  (+) Teeth Intact, Dental Advisory Given   Pulmonary neg pulmonary ROS,    breath sounds clear to auscultation       Cardiovascular hypertension, Pt. on home beta blockers  Rhythm:Regular Rate:Normal     Neuro/Psych Spinal cord injury CVA, Residual Symptoms negative psych ROS   GI/Hepatic negative GI ROS, Neg liver ROS,   Endo/Other  negative endocrine ROS  Renal/GU negative Renal ROS     Musculoskeletal negative musculoskeletal ROS (+)   Abdominal   Peds  Hematology  (+) Blood dyscrasia, anemia ,   Anesthesia Other Findings   Reproductive/Obstetrics                           Anesthesia Physical Anesthesia Plan  ASA: 3  Anesthesia Plan: General   Post-op Pain Management:    Induction: Intravenous  PONV Risk Score and Plan: 2 and Dexamethasone and Ondansetron  Airway Management Planned: Tracheostomy  Additional Equipment:   Intra-op Plan:   Post-operative Plan: Post-operative intubation/ventilation  Informed Consent: I have reviewed the patients History and Physical, chart, labs and discussed the procedure including the risks, benefits and alternatives for the proposed anesthesia with the patient or authorized representative who has indicated his/her understanding and acceptance.     Dental advisory given  Plan Discussed with: CRNA  Anesthesia Plan Comments:        Anesthesia Quick Evaluation

## 2021-05-14 NOTE — Progress Notes (Signed)
Physical Therapy Wound Treatment Patient Details  Name: Luke Wright MRN: [id number] Date of Birth: 1970-11-09  Today's Date: 05/14/2021 PT Individual Time: 1015-1053 PT Individual Time Calculation (min): 38 min   Subjective  Subjective: Pt pleasant and agreeable to hydrotherapy. Patient and Family Stated Goals: None stated Date of Onset:  (Unknown) Prior Treatments: Dressing changes; Santyl  Pain Score:  0-2/10  UE 's positioned for hydro  Wound Assessment  Pressure Injury 04/23/21 Buttocks Left Unstageable - Full thickness tissue loss in which the base of the injury is covered by slough (yellow, tan, gray, green or brown) and/or eschar (tan, brown or black) in the wound bed. Unstageable to left buttock wit (Active)  Wound Image   05/09/21 1148  Dressing Type Barrier Film (skin prep);Foam - Lift dressing to assess site every shift;Gauze (Comment);Moist to dry;Santyl 05/14/21 1101  Dressing Changed;Clean;Dry;Intact 05/14/21 1101  Dressing Change Frequency Daily 05/14/21 1101  State of Healing Early/partial granulation 05/14/21 1101  Site / Wound Assessment Clean;Pink;Red;Yellow 05/14/21 1101  % Wound base Red or Granulating 50% 05/14/21 1101  % Wound base Yellow/Fibrinous Exudate 30% 05/14/21 1101  % Wound base Black/Eschar 0% 05/14/21 1101  % Wound base Other/Granulation Tissue (Comment) 20% 05/14/21 1101  Peri-wound Assessment Intact 05/14/21 1101  Wound Length (cm) 7.2 cm 05/09/21 1148  Wound Width (cm) 5 cm 05/09/21 1148  Wound Depth (cm) 3.5 cm 05/09/21 1148  Wound Surface Area (cm^2) 36 cm^2 05/09/21 1148  Wound Volume (cm^3) 126 cm^3 05/09/21 1148  Tunneling (cm) 0 05/09/21 1148  Undermining (cm) UP to 2.5 cm from 12:00-6:00 margin. 05/09/21 1148  Margins Unattached edges (unapproximated) 05/14/21 1101  Drainage Amount Minimal 05/14/21 1101  Drainage Description Serous;Green;Odor 05/14/21 1101  Treatment Cleansed;Debridement (Selective);Hydrotherapy (Pulse  lavage);Packing (Saline gauze) 05/14/21 1101   Hydrotherapy Pulsed lavage therapy - wound location: L buttock Pulsed Lavage with Suction (psi): 12 psi (4-12) Pulsed Lavage with Suction - Normal Saline Used: 1000 mL Pulsed Lavage Tip: Tip with splash shield Selective Debridement Selective Debridement - Location: L buttock Selective Debridement - Tools Used: Forceps;Scalpel Selective Debridement - Tissue Removed: Yellow unviable tissue   Wound Assessment and Plan  Wound Therapy - Assess/Plan/Recommendations Wound Therapy - Clinical Statement: Progressing with debridement and wound bed appears to be improving. Pt with continued RUE pain with positioning. He will benefit from continued hydrotherapy for selective removal of unviable tissue, to decrease bioburden, and promote wound bed healing. Wound Therapy - Functional Problem List: SCI - decreased tolerance for position changes; immobility Factors Delaying/Impairing Wound Healing: Immobility;Altered sensation Hydrotherapy Plan: Debridement;Patient/family education;Dressing change;Pulsatile lavage with suction Wound Therapy - Frequency: 6X / week Wound Therapy - Current Recommendations: PT Wound Therapy - Follow Up Recommendations: dressing changes by RN  Wound Therapy Goals- Improve the function of patient's integumentary system by progressing the wound(s) through the phases of wound healing (inflammation - proliferation - remodeling) by: Decrease Necrotic Tissue to: 20% Decrease Necrotic Tissue - Progress: Progressing toward goal Increase Granulation Tissue to: 80% Increase Granulation Tissue - Progress: Progressing toward goal Improve Drainage Characteristics: Min;Serous Improve Drainage Characteristics - Progress: Not progressing Goals/treatment plan/discharge plan were made with and agreed upon by patient/family: Yes Time For Goal Achievement: 7 days Wound Therapy - Potential for Goals: Good  Goals will be updated until maximal  potential achieved or discharge criteria met.  Discharge criteria: when goals achieved, discharge from hospital, MD decision/surgical intervention, no progress towards goals, refusal/missing three consecutive treatments without notification or medical reason.  GP  Tessie Fass Delos Klich 05/14/2021, 11:06 AM 05/14/2021  Ginger Carne., PT Acute Rehabilitation Services [phone]  (pager) [phone]  (office)
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
## 2021-05-14 NOTE — Progress Notes (Addendum)
Patient ID: Luke Wright, male   DOB: 12/13/1970, 50 y.o.   MRN: [id number]  SW sent HHPT/OT/SLP/aide/SN/SW referral to [facility] and waiting on follow-up.   [name], MSW, LCSWA Office: [phone] Cell: [phone] Fax: [phone]

## 2021-05-15 ENCOUNTER — Inpatient Hospital Stay (HOSPITAL_COMMUNITY): Payer: 59 | Admitting: Anesthesiology

## 2021-05-15 ENCOUNTER — Inpatient Hospital Stay (HOSPITAL_COMMUNITY): Payer: 59

## 2021-05-15 ENCOUNTER — Encounter (HOSPITAL_COMMUNITY)
Admission: RE | Disposition: A | Discharge status: Home Health | Payer: Self-pay | Attending: Physical Medicine and Rehabilitation

## 2021-05-15 HISTORY — PX: INTRATHECAL PUMP IMPLANT: SHX6809

## 2021-05-15 SURGERY — INTRATHECAL PUMP IMPLANT
Anesthesia: General

## 2021-05-15 MED ORDER — THROMBIN 5000 UNITS EX SOLR
OROMUCOSAL | Status: DC | PRN
  Administered 2021-05-15: 5 mL via TOPICAL

## 2021-05-15 MED ORDER — BUPIVACAINE HCL (PF) 0.5 % IJ SOLN
INTRAMUSCULAR | Status: DC | PRN
  Administered 2021-05-15: 10 mL

## 2021-05-15 MED ORDER — ONDANSETRON HCL 4 MG/2ML IJ SOLN: 4.0000 mg | Freq: Once | INTRAMUSCULAR | Status: DC | PRN

## 2021-05-15 MED ORDER — FENTANYL CITRATE (PF) 100 MCG/2ML IJ SOLN: 25.0000 ug | INTRAMUSCULAR | Status: DC | PRN

## 2021-05-15 MED ORDER — CEFAZOLIN SODIUM-DEXTROSE 2-4 GM/100ML-% IV SOLN
INTRAVENOUS | Status: AC
  Filled 2021-05-15: qty 100

## 2021-05-15 MED ORDER — FENTANYL CITRATE (PF) 250 MCG/5ML IJ SOLN
INTRAMUSCULAR | Status: AC
  Filled 2021-05-15: qty 5

## 2021-05-15 MED ORDER — PHENYLEPHRINE HCL-NACL 20-0.9 MG/250ML-% IV SOLN
INTRAVENOUS | Status: DC | PRN
  Administered 2021-05-15: 100 ug/min via INTRAVENOUS

## 2021-05-15 MED ORDER — BUPIVACAINE HCL (PF) 0.5 % IJ SOLN
INTRAMUSCULAR | Status: AC
  Filled 2021-05-15: qty 30

## 2021-05-15 MED ORDER — MIDAZOLAM HCL 2 MG/2ML IJ SOLN
INTRAMUSCULAR | Status: AC
  Filled 2021-05-15: qty 2

## 2021-05-15 MED ORDER — LACTATED RINGERS IV SOLN: INTRAVENOUS | Status: DC | PRN

## 2021-05-15 MED ORDER — ONDANSETRON HCL 4 MG/2ML IJ SOLN
INTRAMUSCULAR | Status: AC
  Filled 2021-05-15: qty 2

## 2021-05-15 MED ORDER — DEXAMETHASONE SODIUM PHOSPHATE 10 MG/ML IJ SOLN
INTRAMUSCULAR | Status: DC | PRN
  Administered 2021-05-15: 4 mg via INTRAVENOUS

## 2021-05-15 MED ORDER — MIDAZOLAM HCL 5 MG/5ML IJ SOLN
INTRAMUSCULAR | Status: DC | PRN
  Administered 2021-05-15: 2 mg via INTRAVENOUS

## 2021-05-15 MED ORDER — BACLOFEN 40 MG/20ML IT SOLN
INTRATHECAL | Status: DC | PRN
  Administered 2021-05-15: 80 mg via INTRATHECAL

## 2021-05-15 MED ORDER — LIDOCAINE-EPINEPHRINE 1 %-1:100000 IJ SOLN
INTRAMUSCULAR | Status: DC | PRN
  Administered 2021-05-15: 10 mL

## 2021-05-15 MED ORDER — CHLORHEXIDINE GLUCONATE CLOTH 2 % EX PADS
6.0000 | MEDICATED_PAD | Freq: Once | CUTANEOUS | Status: DC
  Administered 2021-05-15: 6 via TOPICAL

## 2021-05-15 MED ORDER — CEFAZOLIN SODIUM-DEXTROSE 2-4 GM/100ML-% IV SOLN
2.0000 g | INTRAVENOUS | Status: AC
  Administered 2021-05-15: 2 g via INTRAVENOUS

## 2021-05-15 MED ORDER — VANCOMYCIN HCL 1000 MG IV SOLR
INTRAVENOUS | Status: AC
  Filled 2021-05-15: qty 20

## 2021-05-15 MED ORDER — THROMBIN 5000 UNITS EX SOLR
CUTANEOUS | Status: AC
  Filled 2021-05-15: qty 5000

## 2021-05-15 MED ORDER — FENTANYL CITRATE (PF) 250 MCG/5ML IJ SOLN
INTRAMUSCULAR | Status: DC | PRN
  Administered 2021-05-15 (×3): 50 ug via INTRAVENOUS

## 2021-05-15 MED ORDER — VANCOMYCIN HCL 1000 MG IV SOLR
INTRAVENOUS | Status: DC | PRN
  Administered 2021-05-15: 1000 mg via TOPICAL

## 2021-05-15 MED ORDER — PROPOFOL 10 MG/ML IV BOLUS
INTRAVENOUS | Status: AC
  Filled 2021-05-15: qty 20

## 2021-05-15 MED ORDER — 0.9 % SODIUM CHLORIDE (POUR BTL) OPTIME
TOPICAL | Status: DC | PRN
  Administered 2021-05-15: 1000 mL

## 2021-05-15 MED ORDER — DEXAMETHASONE SODIUM PHOSPHATE 10 MG/ML IJ SOLN
INTRAMUSCULAR | Status: AC
  Filled 2021-05-15: qty 1

## 2021-05-15 MED ORDER — CEFAZOLIN SODIUM-DEXTROSE 2-4 GM/100ML-% IV SOLN
2.0000 g | Freq: Three times a day (TID) | INTRAVENOUS | Status: AC
  Administered 2021-05-15 – 2021-05-16 (×2): 2 g via INTRAVENOUS
  Filled 2021-05-15 (×2): qty 100

## 2021-05-15 MED ORDER — MORPHINE SULFATE (PF) 2 MG/ML IV SOLN: 2.0000 mg | INTRAVENOUS | Status: AC | PRN

## 2021-05-15 MED ORDER — ACETAMINOPHEN 500 MG PO TABS
1000.0000 mg | ORAL_TABLET | Freq: Once | ORAL | Status: AC
  Administered 2021-05-15: 1000 mg via ORAL
  Filled 2021-05-15: qty 2

## 2021-05-15 MED ORDER — LIDOCAINE-EPINEPHRINE 1 %-1:100000 IJ SOLN
INTRAMUSCULAR | Status: AC
  Filled 2021-05-15: qty 1

## 2021-05-15 MED ORDER — ONDANSETRON HCL 4 MG/2ML IJ SOLN
INTRAMUSCULAR | Status: DC | PRN
  Administered 2021-05-15: 4 mg via INTRAVENOUS

## 2021-05-15 SURGICAL SUPPLY — 76 items
BAG COUNTER SPONGE SURGICOUNT (BAG) ×4 IMPLANT
BAG SURGICOUNT SPONGE COUNTING (BAG) ×2
BLADE CLIPPER SURG (BLADE) IMPLANT
BLADE SURG 10 STRL SS (BLADE) IMPLANT
BLADE SURG 15 STRL LF DISP TIS (BLADE) IMPLANT
BLADE SURG 15 STRL SS (BLADE)
BOOT SUTURE AID YELLOW STND (SUTURE) ×3 IMPLANT
CABLE BIPOLOR RESECTION CORD (MISCELLANEOUS) ×3 IMPLANT
CANISTER SUCT 3000ML PPV (MISCELLANEOUS) ×6 IMPLANT
CARTRIDGE OIL MAESTRO DRILL (MISCELLANEOUS) IMPLANT
CATH ASCENDA 1PIECE (Catheter) ×3 IMPLANT
CNTNR URN SCR LID CUP LEK RST (MISCELLANEOUS) ×2 IMPLANT
CONT SPEC 4OZ STRL OR WHT (MISCELLANEOUS) ×4
COVER MAYO STAND STRL (DRAPES) IMPLANT
DECANTER SPIKE VIAL GLASS SM (MISCELLANEOUS) ×3 IMPLANT
DERMABOND ADVANCED (GAUZE/BANDAGES/DRESSINGS) ×4
DERMABOND ADVANCED .7 DNX12 (GAUZE/BANDAGES/DRESSINGS) ×2 IMPLANT
DIFFUSER DRILL AIR PNEUMATIC (MISCELLANEOUS) IMPLANT
DRAPE C-ARM 42X72 X-RAY (DRAPES) ×3 IMPLANT
DRAPE INCISE IOBAN 85X60 (DRAPES) ×3 IMPLANT
DRAPE LAPAROTOMY 100X72X124 (DRAPES) ×3 IMPLANT
DRAPE ORTHO SPLIT 77X108 STRL (DRAPES) ×2
DRAPE SURG ORHT 6 SPLT 77X108 (DRAPES) ×1 IMPLANT
DRSG OPSITE POSTOP 3X4 (GAUZE/BANDAGES/DRESSINGS) ×3 IMPLANT
DRSG OPSITE POSTOP 4X6 (GAUZE/BANDAGES/DRESSINGS) ×3 IMPLANT
DRSG PAD ABDOMINAL 8X10 ST (GAUZE/BANDAGES/DRESSINGS) IMPLANT
ELECT REM PT RETURN 9FT ADLT (ELECTROSURGICAL) ×3
ELECTRODE REM PT RTRN 9FT ADLT (ELECTROSURGICAL) ×1 IMPLANT
GAUZE 4X4 16PLY ~~LOC~~+RFID DBL (SPONGE) ×3 IMPLANT
GAUZE SPONGE 4X4 12PLY STRL (GAUZE/BANDAGES/DRESSINGS) IMPLANT
GLOVE EXAM NITRILE XL STR (GLOVE) IMPLANT
GLOVE SRG 8 PF TXTR STRL LF DI (GLOVE) ×1 IMPLANT
GLOVE SURG ENC MOIS LTX SZ8 (GLOVE) ×3 IMPLANT
GLOVE SURG LTX SZ8 (GLOVE) ×3 IMPLANT
GLOVE SURG UNDER POLY LF SZ8 (GLOVE) ×2
GLOVE SURG UNDER POLY LF SZ8.5 (GLOVE) ×3 IMPLANT
GOWN STRL REUS W/ TWL LRG LVL3 (GOWN DISPOSABLE) IMPLANT
GOWN STRL REUS W/ TWL XL LVL3 (GOWN DISPOSABLE) ×1 IMPLANT
GOWN STRL REUS W/TWL 2XL LVL3 (GOWN DISPOSABLE) ×3 IMPLANT
GOWN STRL REUS W/TWL LRG LVL3 (GOWN DISPOSABLE)
GOWN STRL REUS W/TWL XL LVL3 (GOWN DISPOSABLE) ×2
HEMOSTAT POWDER KIT SURGIFOAM (HEMOSTASIS) ×3 IMPLANT
KIT BASIN OR (CUSTOM PROCEDURE TRAY) ×3 IMPLANT
KIT REFILL (MISCELLANEOUS) ×2
KIT REFILL CATH SYNCHROMED II (MISCELLANEOUS) ×1 IMPLANT
KIT TURNOVER KIT B (KITS) ×3 IMPLANT
MARKER SKIN DUAL TIP RULER LAB (MISCELLANEOUS) ×3 IMPLANT
NEEDLE HYPO 18GX1.5 BLUNT FILL (NEEDLE) IMPLANT
NEEDLE HYPO 25X1 1.5 SAFETY (NEEDLE) ×3 IMPLANT
NS IRRIG 1000ML POUR BTL (IV SOLUTION) ×3 IMPLANT
OIL CARTRIDGE MAESTRO DRILL (MISCELLANEOUS)
PACK EENT II TURBAN DRAPE (CUSTOM PROCEDURE TRAY) IMPLANT
PASSER CATH 38CM DISP (INSTRUMENTS) ×3 IMPLANT
PATTIES SURGICAL .5 X.5 (GAUZE/BANDAGES/DRESSINGS) IMPLANT
PATTIES SURGICAL 1X1 (DISPOSABLE) IMPLANT
PENCIL BUTTON HOLSTER BLD 10FT (ELECTRODE) IMPLANT
PUMP SYNCHROMED II 40ML RESVR (Neuro Prosthesis/Implant) ×3 IMPLANT
SPONGE SURGIFOAM ABS GEL SZ50 (HEMOSTASIS) IMPLANT
SPONGE T-LAP 4X18 ~~LOC~~+RFID (SPONGE) ×3 IMPLANT
STAPLER SKIN PROX WIDE 3.9 (STAPLE) ×3 IMPLANT
SUT BONE WAX W31G (SUTURE) IMPLANT
SUT PROLENE 3 0 PS 2 (SUTURE) ×15 IMPLANT
SUT SILK 0 TIES 10X30 (SUTURE) ×3 IMPLANT
SUT SILK 2 0 PERMA HAND 18 BK (SUTURE) ×6 IMPLANT
SUT SILK 2 0 TIES 10X30 (SUTURE) ×3 IMPLANT
SUT VIC AB 0 CT1 18XCR BRD8 (SUTURE) IMPLANT
SUT VIC AB 0 CT1 8-18 (SUTURE)
SUT VIC AB 2-0 CP2 18 (SUTURE) ×9 IMPLANT
SUT VIC AB 3-0 SH 8-18 (SUTURE) ×6 IMPLANT
SYR 3ML LL SCALE MARK (SYRINGE) ×3 IMPLANT
SYR CONTROL 10ML LL (SYRINGE) IMPLANT
TOWEL GREEN STERILE (TOWEL DISPOSABLE) ×3 IMPLANT
TOWEL GREEN STERILE FF (TOWEL DISPOSABLE) ×3 IMPLANT
TUBE CONNECTING 12'X1/4 (SUCTIONS) ×1
TUBE CONNECTING 12X1/4 (SUCTIONS) ×2 IMPLANT
WATER STERILE IRR 1000ML POUR (IV SOLUTION) ×3 IMPLANT

## 2021-05-15 NOTE — Progress Notes (Signed)
Occupational Therapy Weekly Progress Note  Patient Details  Name: Luke Wright MRN: [id number] Date of Birth: 29-Jul-1970  Beginning of progress report period: May 09, 2021 End of progress report period: May 15, 2021  Patient has met 0 of 3 short term goals.  Pt dependent for BADLs. Tot A+2 for rolling R/L in bed. Pt directs care and positioning of BUE when resting in bed and PWC. Baclofen pump placement 11/1 to facilitate spasticity management and improve pt's ability to actively participate in BADLs and self care.   Patient continues to demonstrate the following deficits: muscle weakness and muscle paralysis, decreased cardiorespiratoy endurance, impaired timing and sequencing, abnormal tone, unbalanced muscle activation, decreased coordination, and decreased motor planning, decreased motor planning, and decreased sitting balance, decreased postural control, and decreased balance strategies and therefore will continue to benefit from skilled OT intervention to enhance overall performance with BADL and Reduce care partner burden.  Patient progressing toward long term goals..  Continue plan of care.  OT Short Term Goals Week 3:  OT Short Term Goal 1 (Week 3): Pt will self feed with AE PRN with Max A OT Short Term Goal 1 - Progress (Week 3): Progressing toward goal OT Short Term Goal 2 (Week 3): Pt will brush teeth with AE with Max A OT Short Term Goal 2 - Progress (Week 3): Progressing toward goal OT Short Term Goal 3 (Week 3): Pt will roll L/R with 1 assist to decrease caregiver burden OT Short Term Goal 3 - Progress (Week 3): Progressing toward goal Week 4:  OT Short Term Goal 1 (Week 4): Pt will self feed with AE PRN with Max A OT Short Term Goal 2 (Week 4): Pt will brush teeth with AE with Max A OT Short Term Goal 3 (Week 4): Pt will brush teeth with AE with Max A    [name] 05/15/2021, 3:13 PM

## 2021-05-15 NOTE — Transfer of Care (Signed)
Immediate Anesthesia Transfer of Care Note  Patient: Luke Wright  Procedure(s) Performed: Baclofen pump implant  Patient Location: PACU  Anesthesia Type:General  Level of Consciousness: drowsy and patient cooperative  Airway & Oxygen Therapy: Patient Spontanous Breathing and Patient connected to tracheostomy mask oxygen  Post-op Assessment: Report given to RN and Post -op Vital signs reviewed and stable  Post vital signs: Reviewed and stable  Last Vitals:  Vitals Value Taken Time  BP 98/59 05/15/21 1023  Temp    Pulse 93 05/15/21 1028  Resp 50 05/15/21 1028  SpO2 95 % 05/15/21 1028  Vitals shown include unvalidated device data.  Last Pain:  Vitals:   05/15/21 0609  TempSrc: Oral  PainSc:       Patients Stated Pain Goal: 1 (05/02/21 1950)  Complications: No notable events documented.
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
## 2021-05-15 NOTE — Progress Notes (Signed)
Pt return to 4W20 per bed. No complications noted. Vitals rechecked.  Mylo Red, LPN

## 2021-05-15 NOTE — Progress Notes (Signed)
Attempted to turn patient, refused turning patient state he wanted to rest, and not be disturbed. Patient was educated on need for turning and pressure relief. No other concerns to report.

## 2021-05-15 NOTE — Progress Notes (Signed)
Bowel program started. Dig stim performed, clear mucous returned. Suppository inserted. No complications noted. Pt also suctioned at this time and pt in agreement to be repositioned. Mylo Red, LPN
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
## 2021-05-15 NOTE — Anesthesia Procedure Notes (Signed)
Date/Time: 05/15/2021 7:51 AM Performed by: Adria Dill, CRNA Pre-anesthesia Checklist: Patient identified, Emergency Drugs available, Suction available and Patient being monitored Patient Re-evaluated:Patient Re-evaluated prior to induction Oxygen Delivery Method: Circle system utilized Preoxygenation: Pre-oxygenation with 100% oxygen Induction Type: Inhalational induction with existing ETT Placement Confirmation: positive ETCO2 and breath sounds checked- equal and bilateral Dental Injury: Teeth and Oropharynx as per pre-operative assessment  Comments: Inter cannula removed from trach. Trach balloon inflated. Anesthesia circuit attached to trach. +ETCO2, =BBS. VSS. Pt tolerate well.

## 2021-05-15 NOTE — Anesthesia Postprocedure Evaluation (Signed)
Anesthesia Post Note  Patient: Luke Wright  Procedure(s) Performed: Baclofen pump implant     Patient location during evaluation: PACU Anesthesia Type: General Level of consciousness: awake and alert Pain management: pain level controlled Vital Signs Assessment: post-procedure vital signs reviewed and stable Respiratory status: spontaneous breathing, nonlabored ventilation, respiratory function stable and patient connected to nasal cannula oxygen Cardiovascular status: blood pressure returned to baseline and stable Postop Assessment: no apparent nausea or vomiting Anesthetic complications: no   No notable events documented.  Last Vitals:  Vitals:   05/15/21 1223 05/15/21 1417  BP: 115/77 91/64  Pulse: 87 90  Resp: (!) 21 16  Temp: 37.1 C 36.9 C  SpO2: 91% 96%    Last Pain:  Vitals:   05/15/21 1200  TempSrc:   PainSc: 0-No pain                 Cecile Hearing

## 2021-05-15 NOTE — Patient Care Conference (Signed)
Inpatient RehabilitationTeam Conference and Plan of Care Update Date: 05/15/2021   Time: 11:16 AM    Patient Name: Luke Wright      Medical Record Number: [id number]  Date of Birth: 1971-04-12 Sex: Male         Room/Bed: 4W20C/4W20C-01 Payor Info: Payor: Theme park [employer] MEDICARE / Plan: Charmian Muff DUAL COMPLETE / Product Type: *No Product type* /    Admit Date/Time:  04/23/2021  2:17 PM  Primary Diagnosis:  Quadriplegia, unspecified [facility])  Hospital Problems: Principal Problem:   Quadriplegia, unspecified ([place]) Active Problems:   Pressure injury of skin   Pressure injury of sacral region, unstageable ([place])   Hypoalbuminemia due to protein-calorie malnutrition ([place])   Neurogenic bowel   Acute blood loss anemia   Essential hypertension   Dysphagia   Leukocytosis   Hypokalemia   Tracheostomy care [facility])    Expected Discharge Date: Expected Discharge Date: 05/25/21  Team Members Present: Physician leading conference: Dr. Courtney Heys Social Worker Present: Loralee Pacas, [place] Nurse Present: Dorthula Nettles, RN PT Present: Excell Seltzer, PT OT Present: Roanna Epley, [place], OT PPS Coordinator present : Gunnar Fusi, SLP     Current Status/Progress Goal Weekly Team Focus  Bowel/Bladder   indwelling catheter in place, bowel program  family members will learn bowel program and I&O cathing  bowel program   Swallow/Nutrition/ Hydration   regular/thin mod I  Mod I  goals met. Pt discharged from [place]; rolling R/L in bed with tot A+2; pt directs care independently; sitting balance-tot A+2; daughter has been in for some education  Max/total bathe/dress, Mod self feed/oral hygiene with AE to be downgraded  education, bed mobility, sitting balance, directing care   Mobility   total A x 2 rolling and supine to/from sit, transfer via maxi move, PWC mobility Supervision to min A  dependent for transfers, mod I PWC, pt indep to direct  family/caregivers with transfers and pressure relief  continuing PWC mobility and education, SCI education, pt family and education   Communication   Tolerating PMSV, functional speech intelligiblity  mod I  goals met. Pt discharged from ST   Safety/Cognition/ Behavioral Observations            Pain   pain in arms 8/10  <2  assess q shift and prn   Skin   unstagebale to left gluteal  rotate q 2hours  assess ski daily and prn, moist to dry with santyl     Discharge Planning:  D/c to home with his dtr who will provide 24/7 care even with caring for 6 children under 66 y.o. Family mtg conducted. Pending services for private duty nursing since waiting on updates from agency. Challenges remain with obtaining HH due to pt care needs.   Team Discussion: Baclofen pump place at T1 level, great for arms and pain should improve.MD will begin to titrate on Thursday. Willing to stay until 05/25/21. Foley in place, nightly bowel program, need nightly documentation. Hydrotherapy MWF for unstageable, nursing to do daily dressing changes on days when no hydrotherapy. Doesn't like to be turned d/t interfering with sleep, still has to do it. Continue foley care education, bowel program education, turning education, skin/wound care education. Discharging home with daughter. Amount of care needs making it hard to find [facility] care. Discharged from [place]. Patient on target to meet rehab goals: yes, met all SLP goals and family education completed. Family in for family education with OT on Sunday. To start  working on upper body ADL's.  *See Care Plan and progress notes for long and short-term goals.   Revisions to Treatment Plan:  Baclofen pump in place, begin titration on Thursday.   Teaching Needs: Family education, medication/pain management, skin/wound care management, foley care/bowel program education, transfers and turning education, respiratory and trach care education.  Current Barriers to Discharge: Decreased  caregiver support, Home enviroment access/layout, Trach, Neurogenic bowel and bladder, Wound care, Lack of/limited family support, Insurance for SNF coverage, Weight bearing restrictions, and Medication compliance  Possible Resolutions to Barriers: Family education ITB pump titration/education Order DME  Continue education with respiratory Order power wheelchair      Medical Summary Current Status: getting ITB pump today- starting at 100 mcg/day- will start titrating on THursday; bowel program; and foley per pt /daughter request; hydrotherapy M/W/F- still refusing turning because of reduced sleep/pain on sides.  Barriers to Discharge: Decreased family/caregiver support;Home enviroment access/layout;Medical stability;Trach;Neurogenic Bowel & Bladder;Weight bearing restrictions;Wound care;Other (comments);Medication compliance  Barriers to Discharge Comments: ITB titration- barrier- getting H/H as well- very difficult- private duty nursing? needs more family training. needs esp resp/PT/OT and nursing; power w/c from stall's- needs bowel program education Possible Resolutions to Barriers/Weekly Focus: focus- titrate up ITB pump to reduce how many appts has to come to clinic-SLP finished; goal of OT to see if can get self feeding at all- pt very noncompliant- - d/c 11/11   Continued Need for Acute Rehabilitation Level of Care: The patient requires daily medical management by a physician with specialized training in physical medicine and rehabilitation for the following reasons: Direction of a multidisciplinary physical rehabilitation program to maximize functional independence : Yes Medical management of patient stability for increased activity during participation in an intensive rehabilitation regime.: Yes Analysis of laboratory values and/or radiology reports with any subsequent need for medication adjustment and/or medical intervention. : Yes   I attest that I was present, lead the team  conference, and concur with the assessment and plan of the team.   Cristi Loron 05/15/2021, 5:18 PM

## 2021-05-16 ENCOUNTER — Encounter (HOSPITAL_COMMUNITY): Payer: Self-pay | Admitting: Neurological Surgery

## 2021-05-16 NOTE — Progress Notes (Signed)
Occupational Therapy Session Note  Patient Details  Name: Luke Wright MRN: [id number] Date of Birth: 1970-11-18  Today's Date: 05/16/2021 OT Individual Time: 0110-0349 OT Individual Time Calculation (min): 48 min    Short Term Goals: Week 2:  OT Short Term Goal 1 (Week 2): Pt will self feed with AE PRN with Max A OT Short Term Goal 1 - Progress (Week 2): Progressing toward goal OT Short Term Goal 2 (Week 2): Pt will brush teeth with AE with Max A OT Short Term Goal 2 - Progress (Week 2): Progressing toward goal OT Short Term Goal 3 (Week 2): Pt will roll L/R with 1 assist to decrease caregiver burden OT Short Term Goal 3 - Progress (Week 2): Progressing toward goal OT Short Term Goal 4 (Week 2): Pt will tolerate wearing B elbow splints for 2 hours with no redness/irritation OT Short Term Goal 4 - Progress (Week 2): Met Week 3:  OT Short Term Goal 1 (Week 3): Pt will self feed with AE PRN with Max A OT Short Term Goal 1 - Progress (Week 3): Progressing toward goal OT Short Term Goal 2 (Week 3): Pt will brush teeth with AE with Max A OT Short Term Goal 2 - Progress (Week 3): Progressing toward goal OT Short Term Goal 3 (Week 3): Pt will roll L/R with 1 assist to decrease caregiver burden OT Short Term Goal 3 - Progress (Week 3): Progressing toward goal Week 4:  OT Short Term Goal 1 (Week 4): Pt will self feed with AE PRN with Max A OT Short Term Goal 2 (Week 4): Pt will brush teeth with AE with Max A OT Short Term Goal 3 (Week 4): Pt will brush teeth with AE with Max A   Skilled Therapeutic Interventions/Progress Updates:    Pt greeted at time of session semireclined in bed resting with daughter present for family education. Note no pain throughout session. Focus of session initially on discussion for re-iterating education from this morning regarding importance of positioning, pressure relief, skin inspections and pt/daughter verbalizing understanding. LB dressing with 2 helpers at  bed level including TEDS/socks and shorts. Pt able to direct care for UE placement and how to position the pt. 2 helpers for [facility] transfer bed > power chair with daughter assisting with managing LE's and assisting with tilting hips for positioning chair level. Pt/family ed on importance of multiple helpers present for transfers. Extensive time spent on positioning power chair level. Note O2 sats 94% or higher throughout session, switched to portable O2 during transfer, at end of session up in power chair reclined with daughter present and all needs met.   Therapy Documentation Precautions:  Precautions Precautions: Fall Precaution Comments: C5 SCI, [place] A, trach Restrictions Weight Bearing Restrictions: Yes     Therapy/Group: Individual Therapy  Viona Gilmore 05/16/2021, 7:27 AM
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
## 2021-05-16 NOTE — Progress Notes (Addendum)
Patient ID: Luke Wright, male   DOB: [birth year], 51 y.o.   MRN: [id number]  SW received updates from Angie/[facility] who reported they have no OT.  SW sent referral to Cory[facility] and Amy/[facility] and waiting on follow-up.   SW spoke with Alex/Clinical Manager with [employer] [phone]) to discuss if a decision has been made on if pt will be able to be staffed for services. Reports he will speak with team and discuss on what needs to happen moving forward. SW also informed faxing forms requested. SW faxed forms [phone].  SW faxed PCS evaluation request to [facility] [phone].   [name], MSW, LCSWA Office: [phone] Cell: [phone] Fax: [phone]

## 2021-05-16 NOTE — Progress Notes (Signed)
Physical Therapy Session Note  Patient Details  Name: Luke Wright MRN: [id number] Date of Birth: Mar 19, 1971  Today's Date: 05/16/2021 PT Individual Time: 0915-1000 PT Individual Time Calculation (min): 45 min   Short Term Goals: Week 3:  PT Short Term Goal 1 (Week 3): Pt will perform PWC mobility x 150 ft at Supervision level PT Short Term Goal 2 (Week 3): Pt will recall pressure relief schedule and be able to perform pressure relief in PWC with min cueing PT Short Term Goal 3 (Week 3): Pt's family to initiate hands-on family education training  Skilled Therapeutic Interventions/Progress Updates: Pt presents semi-reclined in bed and finishing breakfast w/ NT assist.  Pt agreeable to participate in ROM to all 4 extremities.  Pt to receive hydrotherapy immediately following this session. Pt tolerated long stretches to BUEs w/in available ROM w/ overpressure at end range, especially elbow extension (only to approx. 90 degrees B) and horizontal adduction.  Pt tolerated LE stretches well.  B heels checked w/o irritation noted.  Pt remained in bed w/ all needs in reach.     Therapy Documentation Precautions:  Precautions Precautions: Fall Precaution Comments: C5 SCI, Asia A, trach Restrictions Weight Bearing Restrictions: Yes General:   Vital Signs: Therapy Vitals Pulse Rate: 92 Resp: 16 Oxygen Therapy SpO2: 94 % O2 Device: Tracheostomy Collar O2 Flow Rate (L/min): 8 L/min FiO2 (%): 35 % Pain: pt states 9/10 to B arms, but unsure of pain meds.      Therapy/Group: Individual Therapy  [name] 05/16/2021, 10:02 AM

## 2021-05-16 NOTE — Progress Notes (Signed)
Patient had medium size bowel movement. No other issues to report.
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
## 2021-05-16 NOTE — Progress Notes (Signed)
Sherry with respiratory therapy called and will be here 11/03 at 15:30 for trach education with pt daughter. Notified CM.

## 2021-05-16 NOTE — Progress Notes (Addendum)
Occupational Therapy Session Note  Patient Details  Name: Luke Wright MRN: [id number] Date of Birth: 1970/12/20  Today's Date: 05/16/2021 OT Individual Time:  -       Short Term Goals: Week 1:  OT Short Term Goal 1 (Week 1): Pt will self feed with AE PRN with Max A OT Short Term Goal 1 - Progress (Week 1): Progressing toward goal OT Short Term Goal 2 (Week 1): Pt will brush teeth with AE with Max A OT Short Term Goal 2 - Progress (Week 1): Progressing toward goal OT Short Term Goal 3 (Week 1): Pt will roll L/R with 1 assist to decrease caregiver burden OT Short Term Goal 3 - Progress (Week 1): Progressing toward goal OT Short Term Goal 4 (Week 1): Pt will tolerate wearing B elbow splints for 2 hours with no redness/irritation OT Short Term Goal 4 - Progress (Week 1): Progressing toward goal  Skilled Therapeutic Interventions/Progress Updates:    Patient seen in room, pt position in bed with pillows in place for proper placement to reduce risk for skin break down.  The pt was instructed in voice activated functionality using mobile device for texting or making hand free calls. The pt completed UB PROM/AROM to improve the quality of his joints, specifically bilateral shld, arm, elbows, with patient/caregiver education in relation to donning bilateral elbow splint apparatus, and ways to improve the patient's anatomical position for comfort and to minimize his risk for skin break down. The pt was also instructed on the importance of changing his position every 2 hours to decrease his risk for challenges with skin integrity.  The pt call light was in place and he was comfortable upon exiting the room with family support present.  Therapy Documentation Precautions:  Precautions Precautions: Fall Precaution Comments: C5 SCI, Asia A, trach Restrictions Weight Bearing Restrictions: No General:   Vital Signs: Therapy Vitals Pulse Rate: 70 Resp: 16 Oxygen Therapy SpO2: 94 % O2 Device:  Tracheostomy Collar O2 Flow Rate (L/min): 8 L/min FiO2 (%): 35 % Pain:   ADL: ADL Equipment Provided: Feeding equipment Eating: Dependent (simulated at eval) Grooming: Dependent (simulated at eval) Upper Body Bathing: Dependent (simulated at eval) Lower Body Bathing: Dependent (simulated at eval) Upper Body Dressing: Dependent (simulated at eval) Lower Body Dressing: Dependent (simulated at eval) Toileting: Not assessed Toilet Transfer: Not assessed (not appropriate at this time) ADL Comments: note all ADLs simulated 2/2 time constraints Vision   Perception    Praxis   Exercises:   Other Treatments:     Therapy/Group: Individual Therapy  Lavona Mound 05/16/2021, 1:31 PM

## 2021-05-16 NOTE — Progress Notes (Addendum)
Pt didn't want to do vest CPT this AM. Will attempt at a later time.

## 2021-05-16 NOTE — Progress Notes (Signed)
Physical Therapy Weekly Progress Note  Patient Details  Name: Luke Wright MRN: [id number] Date of Birth: 04/07/1971  Beginning of progress report period: May 08, 2021 End of progress report period: May 16, 2021  Today's Date: 05/16/2021 PT Individual Time: 1400-1500 PT Individual Time Calculation (min): 60 min   Patient has met 3 of 3 short term goals.  Pt is making great progress towards therapy goals. He currently remains total A x 2 for rolling and supine to/from sit, is dependent for transfers with a mechanical lift, and is at Supervision level for power wheelchair mobility. He has made great progress with his ability to drive the power wheelchair safely and is improving in his ability to perform pressure relief independently in the [place]. Pt's daughter [name] has been present for several therapy session for hands-on training including assisting pt with bed mobility, transfers via manual hoyer lift, and further education as noted in daily note below. Pt and his family will require ongoing education leading up to d/c home next week including extensive education from nursing and respiratory therapy staff regarding trach care, bowel and bladder management, wound care, etc. Pt has also been able to receive a Baclofen pump this week in the hopes to reduce UE pain and spasticity. Pt will be monitored over the next week for changes to his spasticity.  Patient continues to demonstrate the following deficits muscle weakness, muscle joint tightness, and muscle paralysis, decreased cardiorespiratoy endurance and decreased oxygen support, abnormal tone and unbalanced muscle activation, and decreased sitting balance, decreased postural control, and decreased balance strategies and therefore will continue to benefit from skilled PT intervention to increase functional independence with mobility.  Patient progressing toward long term goals..  Continue plan of care.  PT Short Term Goals Week 3:  PT  Short Term Goal 1 (Week 3): Pt will perform PWC mobility x 150 ft at Supervision level PT Short Term Goal 1 - Progress (Week 3): Met PT Short Term Goal 2 (Week 3): Pt will recall pressure relief schedule and be able to perform pressure relief in PWC with min cueing PT Short Term Goal 2 - Progress (Week 3): Met PT Short Term Goal 3 (Week 3): Pt's family to initiate hands-on family education training PT Short Term Goal 3 - Progress (Week 3): Met Week 4:  PT Short Term Goal 1 (Week 4): =LTG due to ELOS  Skilled Therapeutic Interventions/Progress Updates:    Pt received seated in PWC in room with daughter present for family education session. Pt reports soreness in BUE, not rated. Reviewed PWC controls, Roho cushion management, pressure relief schedule and importance of adherence for skin protection, quad cough, BP management, and importance of family being present for bowel program in evening after dinner so that they can practice prior to d/c home. Pt's daughter understanding of all education and reports she and her significant other will be present tomorrow for family education as well. Re-assessed MAS in [place], see Flowsheet for details. Pt requests to return to bed from [facility]. Maxi move transfer w/c to bed. Rolling L/R with total A for removal of sling. Pt left seated in bed in care of nursing at end of session.  MAS of R and L elbow: 4  Therapy Documentation Precautions:  Precautions Precautions: Fall Precaution Comments: C5 SCI, Asia A, trach Restrictions Weight Bearing Restrictions: No Extremity Assessment  RUE Tone RUE Tone: Modified Ashworth Body Part - Modified Ashworth Scale: Elbow Elbow - Modified Ashworth Scale for Grading Hypertonia RUE: Affected  part(s) rigid in flexion or extension Modified Ashworth Scale for Grading Hypertonia RUE: Affected part(s) rigid in flexion or extension LUE Tone LUE Tone: Modified Ashworth Body Part - Modified Ashworth Scale: Elbow Elbow - Modified  Ashworth Scale for Grading Hypertonia LUE: Affected part(s) rigid in flexion or extension Modified Ashworth Scale for Grading Hypertonia LUE: Affected part(s) rigid in flexion or extension     Therapy/Group: Individual Therapy   Excell Seltzer, PT, DPT, CSRS 05/16/2021, 5:04 PM

## 2021-05-17 LAB — GLUCOSE, CAPILLARY: Glucose-Capillary: 92 mg/dL (ref 70–99)

## 2021-05-17 MED ORDER — AMITRIPTYLINE HCL 25 MG PO TABS
25.0000 mg | ORAL_TABLET | Freq: Every day | ORAL | Status: DC
  Administered 2021-05-17 – 2021-05-24 (×8): 25 mg via ORAL
  Filled 2021-05-17 (×8): qty 1

## 2021-05-17 MED ORDER — AMITRIPTYLINE HCL 25 MG PO TABS: 25.0000 mg | ORAL_TABLET | Freq: Every day | ORAL | Status: DC

## 2021-05-17 MED ORDER — ENOXAPARIN SODIUM 40 MG/0.4ML IJ SOSY
40.0000 mg | PREFILLED_SYRINGE | INTRAMUSCULAR | Status: DC
  Administered 2021-05-17 – 2021-05-25 (×9): 40 mg via SUBCUTANEOUS
  Filled 2021-05-17 (×9): qty 0.4
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
## 2021-05-17 NOTE — Progress Notes (Signed)
Bowel program continue with digital stim x2, patient had large brownish liquid stool x2, incontinent care provided and support with repositioning and applying bilateral arm splint. Patient somewhat anxious regarding thoughts of going home with daughter, but states he knows things will work out. Emotional support provided

## 2021-05-17 NOTE — Progress Notes (Signed)
Patient ID: CARLESS SLATTEN, male   DOB: 1970/11/13, 50 y.o.   MRN: [id number]  SW still waiting on updates from [facility] to see if able to accept referral. SW sent referral to [facility] as well.   [name], MSW, LCSWA Office: [phone] Cell: [phone] Fax: [phone]

## 2021-05-17 NOTE — Progress Notes (Addendum)
Occupational Therapy Session Note  Patient Details  Name: Luke Wright MRN: [id number] Date of Birth: 05/25/1971  Today's Date: 05/17/2021 OT Individual Time:  - 11:10 til 11:45   Patient seen for 35 minutes  Missed 25 minutes secondary to previous pt care.    Short Term Goals: Week 3:  OT Short Term Goal 1 (Week 3): Pt will self feed with AE PRN with Max A OT Short Term Goal 1 - Progress (Week 3): Progressing toward goal OT Short Term Goal 2 (Week 3): Pt will brush teeth with AE with Max A OT Short Term Goal 2 - Progress (Week 3): Progressing toward goal OT Short Term Goal 3 (Week 3): Pt will roll L/R with 1 assist to decrease caregiver burden OT Short Term Goal 3 - Progress (Week 3): Progressing toward goal  Skilled Therapeutic Interventions/Progress Updates:    The  pt was seen this tx session at bed LOF the pt was resting initially in supine position with his call bell at head level for assistance. The pt indicated that he was somewhat fatigue, but was willing to participate.  The pt tolerated PROM of bilateral UE involving the elbow, arm, hand, and digits to improve upon strength, ROM, and quality of the structure to minimize the risk for greater contracture. The pt was instructed on the importance of change in position to maintain the integrity of the tissue of areas at risk for pressure sores. The pt/ caregivers were  educated on positioning , the purpose of skin check/ care, and ways to minimize risk for break down. Prior to me exiting the room, the patient's call light was in place and he indicated that he was comfortable with his family present for lunch.   Therapy Documentation Precautions:  Precautions Precautions: Fall Precaution Comments: C5 SCI, Asia A, trach Restrictions Weight Bearing Restrictions: No General: General PT Missed Treatment Reason: Other (Comment) (Respirtory/chest PT) Vital Signs: Therapy Vitals Pulse Rate: 76 Resp: 18 Patient Position (if  appropriate): Lying Oxygen Therapy SpO2: 97 % O2 Device: Tracheostomy Collar O2 Flow Rate (L/min): 8 L/min FiO2 (%): 35 % Pain: Pain Assessment Pain Scale: 0-10 Pain Score: 7  Pain Type: Acute pain Pain Location: Arm Pain Orientation: Right;Left Pain Intervention(s): Medication (See eMAR) ADL: ADL Equipment Provided: Feeding equipment Eating: Dependent (simulated at eval) Grooming: Dependent (simulated at eval) Upper Body Bathing: Dependent (simulated at eval) Lower Body Bathing: Dependent (simulated at eval) Upper Body Dressing: Dependent (simulated at eval) Lower Body Dressing: Dependent (simulated at eval) Toileting: Not assessed Toilet Transfer: Not assessed (not appropriate at this time) ADL Comments: note all ADLs simulated 2/2 time constraints Vision   Perception    Praxis   Exercises:   Other Treatments:     Therapy/Group: Individual Therapy  Lavona Mound 05/17/2021, 1:10 PM

## 2021-05-17 NOTE — Progress Notes (Signed)
11/3- 1545-Respiratory care note- Came to do bedside trach teaching with patient's family- No family at bedside and pt sleeping. Trach education booklets left at bedside last week.
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
## 2021-05-17 NOTE — Progress Notes (Signed)
Occupational Therapy Session Note  Patient Details  Name: Luke Wright MRN: [id number] Date of Birth: 12/10/1970  Today's Date: 05/17/2021 OT Individual Time: 1300-1354 OT Individual Time Calculation (min): 54 min    Short Term Goals: Week 3:  OT Short Term Goal 1 (Week 3): Pt will self feed with AE PRN with Max A OT Short Term Goal 1 - Progress (Week 3): Progressing toward goal OT Short Term Goal 2 (Week 3): Pt will brush teeth with AE with Max A OT Short Term Goal 2 - Progress (Week 3): Progressing toward goal OT Short Term Goal 3 (Week 3): Pt will roll L/R with 1 assist to decrease caregiver burden OT Short Term Goal 3 - Progress (Week 3): Progressing toward goal Week 4:  OT Short Term Goal 1 (Week 4): Pt will self feed with AE PRN with Max A OT Short Term Goal 2 (Week 4): Pt will brush teeth with AE with Max A OT Short Term Goal 3 (Week 4): Pt will brush teeth with AE with Max A   Skilled Therapeutic Interventions/Progress Updates:    Pt greeted at time of session supine in bed resting with HOB slightly elevated and daughter natalie and significant other Matt present, both only staying for a few minutes at beginning of session prior to leaving. Brief family ed performed verbally regarding hoyer lifts, managing tubes/lines, and positioning before both had to leave. When rolling pt left/right with 2 helpers to place hoyer pad, noted to have smear of BM. Brief change bed level and donned shorts 2 helpers. Maximove transfer bed > power chair with 2 helpers, switched to portable O2 at 8L during transfer. Note pt in chair position during transfer and once up in power chair, BP checked and 79/60. Reclined further and feet elevated, increased to 98/63. Nursing present throughout as well. BP checked frequently with incremental changes to promote more upright position but pt unable to tolerate and eventually needing to be reclined with feet up for stable BP. Pt stating he feels fine in this  position. Pt up in chair with all needs met with nursing present.   Therapy Documentation Precautions:  Precautions Precautions: Fall Precaution Comments: C5 SCI, Asia A, trach Restrictions Weight Bearing Restrictions: No    Therapy/Group: Individual Therapy  Hannah C Spach 05/17/2021, 7:25 AM 

## 2021-05-17 NOTE — Progress Notes (Signed)
RT NOTE: Upon review of patient's chart RT noticed that order was placed for trach change to cuffless on 05/07/2021. No documentation of trach change in chart. This RT spoke with RT that took care of patient on that day who stated that trachs had been ordered but not received that day. Reported off that it would have to be done the next day (05/08/2021). Still no documentation on trach change or explanation why it had not been done. This RT called Dr. Katrinka Blazing with [facility] in regards to getting new order placed for trach change. This RT also spoke with secretary of [employer] for ordering of 2 cuffless trachs. Dr. Katrinka Blazing aware of trach change not being done and is in agreement with changing trach either today before 5:00 per protocol or tomorrow 05/18/2021 if not able to be changed before 5:00pm today. Vitals are stable. RT will continue to monitor.

## 2021-05-18 NOTE — Progress Notes (Signed)
Patient ID: Luke Wright, male   DOB: 27-Sep-1970, 50 y.o.   MRN: [id number]  SW still waiting on updates from [facility], [facility], and [facility] about referral.   Declined HHAs [facility]  [name], MSW, [place] Office: [phone] Cell: [phone] Fax: [phone]

## 2021-05-18 NOTE — Progress Notes (Signed)
Physical Therapy Wound Treatment Patient Details  Name: Luke Wright MRN: [id number] Date of Birth: 1970-10-13  Today's Date: 05/18/2021 PT Individual Time: 1015-1057 PT Individual Time Calculation (min): 42 min   Subjective  Subjective: Pt pleasant and agreeable to hydrotherapy. Patient and Family Stated Goals: None stated Date of Onset:  (Unknown) Prior Treatments: Dressing changes; Santyl  Pain Score: Pain Score: 0-No pain at wound, but 2-4 at UE's with required positioning.   Wound Assessment  Pressure Injury 04/23/21 Buttocks Left Unstageable - Full thickness tissue loss in which the base of the injury is covered by slough (yellow, tan, gray, green or brown) and/or eschar (tan, brown or black) in the wound bed. Unstageable to left buttock wit (Active)  Wound Image   05/16/21 1100  Dressing Type Barrier Film (skin prep);Foam - Lift dressing to assess site every shift;Impregnated gauze (petrolatum);Moist to dry;Normal saline moist dressing;Santyl 05/18/21 1224  Dressing Changed;Clean;Dry;Intact 05/18/21 1224  Dressing Change Frequency Daily 05/18/21 1224  State of Healing Early/partial granulation 05/18/21 1224  Site / Wound Assessment Clean;Pink;Red;Yellow 05/18/21 1224  % Wound base Red or Granulating 60% 05/18/21 1224  % Wound base Yellow/Fibrinous Exudate 25% 05/18/21 1224  % Wound base Black/Eschar 0% 05/18/21 1224  % Wound base Other/Granulation Tissue (Comment) 15% 05/18/21 1224  Peri-wound Assessment Intact 05/18/21 1224  Wound Length (cm) 10 cm 05/16/21 1100  Wound Width (cm) 4 cm 05/16/21 1100  Wound Depth (cm) 3.5 cm 05/16/21 1100  Wound Surface Area (cm^2) 40 cm^2 05/16/21 1100  Wound Volume (cm^3) 140 cm^3 05/16/21 1100  Tunneling (cm) 0 05/09/21 1148  Undermining (cm) 12*  3.5 cm 05/16/21 1100  Margins Unattached edges (unapproximated) 05/18/21 1224  Drainage Amount Minimal 05/18/21 1224  Drainage Description Serous;Sanguineous 05/18/21 1224  Treatment  Cleansed;Debridement (Selective);Hydrotherapy (Pulse lavage);Packing (Saline gauze) 05/18/21 1224      Hydrotherapy Pulsed lavage therapy - wound location: L buttock Pulsed Lavage with Suction (psi): 12 psi (4-12) Pulsed Lavage with Suction - Normal Saline Used: 1000 mL Pulsed Lavage Tip: Tip with splash shield Selective Debridement Selective Debridement - Location: L buttock Selective Debridement - Tools Used: Forceps;Scalpel Selective Debridement - Tissue Removed: Yellow unviable tissue   Wound Assessment and Plan  Wound Therapy - Assess/Plan/Recommendations Wound Therapy - Clinical Statement: Progressing with debridement and wound bed appears to be improving. Pt with continued RUE pain with positioning. He will benefit from continued hydrotherapy for selective removal of unviable tissue, to decrease bioburden, and promote wound bed healing. Wound Therapy - Functional Problem List: SCI - decreased tolerance for position changes; immobility Factors Delaying/Impairing Wound Healing: Immobility;Altered sensation Hydrotherapy Plan: Debridement;Patient/family education;Dressing change;Pulsatile lavage with suction Wound Therapy - Frequency: 6X / week Wound Therapy - Current Recommendations: PT Wound Therapy - Follow Up Recommendations: dressing changes by RN  Wound Therapy Goals- Improve the function of patient's integumentary system by progressing the wound(s) through the phases of wound healing (inflammation - proliferation - remodeling) by: Decrease Necrotic Tissue to: 20% Decrease Necrotic Tissue - Progress: Progressing toward goal Increase Granulation Tissue to: 80% Increase Granulation Tissue - Progress: Progressing toward goal Improve Drainage Characteristics: Min;Serous Improve Drainage Characteristics - Progress: Progressing toward goal Goals/treatment plan/discharge plan were made with and agreed upon by patient/family: Yes Time For Goal Achievement: 7 days Wound Therapy -  Potential for Goals: Good  Goals will be updated until maximal potential achieved or discharge criteria met.  Discharge criteria: when goals achieved, discharge from hospital, MD decision/surgical intervention, no progress towards goals, refusal/missing three  consecutive treatments without notification or medical reason.  GP     Tessie Fass Ladarrian Asencio 05/18/2021, 12:27 PM 05/18/2021  Ginger Carne., PT Acute Rehabilitation Services [phone]  (pager) [phone]  (office)
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
## 2021-05-18 NOTE — Progress Notes (Signed)
Physical Therapy Session Note  Patient Details  Name: Luke Wright MRN: [id number] Date of Birth: 04/01/1971  Today's Date: 05/18/2021 PT Individual Time: 1505-1558 PT Individual Time Calculation (min): 53 min   Short Term Goals: Week 4:  PT Short Term Goal 1 (Week 4): =LTG due to ELOS  Skilled Therapeutic Interventions/Progress Updates: Pt presented in PWC agreeable to therapy. Per OT from previous session BP low. BP checked in recliner position 82/55, moved into slightly upright position with drop to 77/49 with mild HA per pt. Returned to reclined position and discussed with nsg, PTA requesting abdominal binder. At this time deferred any upright activity due to decreasing BP. Pt transferred back to bed via mechanical lift BP checked 111/74, removed ace bandages and TED hose and BP assessed again 111/74. Pt repositioned to comfort and left with soft touch bell within reach and needs met.      Therapy Documentation Precautions:  Precautions Precautions: Fall Precaution Comments: C5 SCI, [place] A, trach Restrictions Weight Bearing Restrictions: No General:   Vital Signs: Therapy Vitals Temp: (!) 97.5 F (36.4 C) Temp Source: Oral Pulse Rate: 81 Resp: 19 BP: 126/61 Patient Position (if appropriate): Lying Oxygen Therapy SpO2: 94 % O2 Device: Tracheostomy Collar O2 Flow Rate (L/min): 8 L/min FiO2 (%): 35 % Pain:   Mobility:   Locomotion :    Trunk/Postural Assessment :    Balance:   Exercises:   Other Treatments:      Therapy/Group: Individual Therapy  Rosita DeChalus 05/18/2021, 4:10 PM  

## 2021-05-18 NOTE — Progress Notes (Signed)
Occupational Therapy Session Note  Patient Details  Name: Luke Wright MRN: [id number] Date of Birth: 01/21/1971  Today's Date: 05/18/2021 OT Individual Time: 9340-6840 OT Individual Time Calculation (min): 21 min   OT Individual Time: 3353-3174 OT Individual Time Calculation (min): 48 min   Short Term Goals: Week 4:  OT Short Term Goal 1 (Week 4): Pt will self feed with AE PRN with Max A OT Short Term Goal 2 (Week 4): Pt will brush teeth with AE with Max A OT Short Term Goal 3 (Week 4): Pt will brush teeth with AE with Max A  Skilled Therapeutic Interventions/Progress Updates:  Session 1: Patient met lying supine in bed in agreement with OT treatment session. 0/10 pain reported at rest. RN in patient room for treatment with patient missing 24 minutes of OT treatment session. Upon return to room, session with focus on BUE/BLE PROM for contracture management. Did not don LB clothing as patient scheduled for hydrotherapy this a.m. Session concluded with patient lying supine in bed with soft call bell within reach and all needs met.   Session 2: Patient met lying supine in bed in agreement with OT treatment session. 0/10 pain reported at rest. LB clothing donned in supine with Total A +2. Patient transferred to power wheelchair via maxi move +2 assist for safety. BP 156/55 at start of session, dropping to 90/36 once seated in power wheelchair. BP taken again after being in tilted position for >5 min with reading of 92/31. Handoff to PT. Session concluded with patient seated in power wheelchair with soft call bell within reach and  all needs met.   Therapy Documentation Precautions:  Precautions Precautions: Fall Precaution Comments: C5 SCI, Asia A, trach Restrictions Weight Bearing Restrictions: No General: General OT Amount of Missed Time: 24 Minutes   Therapy/Group: Individual Therapy  Therron Sells R Howerton-Davis 05/18/2021, 9:59 AM

## 2021-05-18 NOTE — Procedures (Signed)
Tracheostomy Change Note  Patient Details:   Name: Luke Wright DOB: 10/31/1970 MRN: [id number]    Airway Documentation:   Trach changed per order 11/3 (see RT prog note from 11/3).  No apparent complications. No distress noted, sat 96%.  BBSH = clear diminished.  Pt is able to speak w/ PMV in place.  Pt states he feels "fine".     Evaluation  O2 sats: stable throughout Complications: No apparent complications Patient did tolerate procedure well. Bilateral Breath Sounds: Diminished, Clear    Jennette Kettle 05/18/2021, 12:45 PM

## 2021-05-19 DIAGNOSIS — G8250 Quadriplegia, unspecified: Secondary | ICD-10-CM | POA: Diagnosis not present

## 2021-05-19 NOTE — Progress Notes (Signed)
Occupational Therapy Session Note  Patient Details  Name: DAJION BICKFORD MRN: [id number] Date of Birth: 1971-06-14  Today's Date: 05/19/2021 OT Individual Time: 1000-1056 OT Individual Time Calculation (min): 56 min    Short Term Goals: Week 4:  OT Short Term Goal 1 (Week 4): Pt will self feed with AE PRN with Max A OT Short Term Goal 2 (Week 4): Pt will brush teeth with AE with Max A OT Short Term Goal 3 (Week 4): Pt will brush teeth with AE with Max A  Skilled Therapeutic Interventions/Progress Updates:    Pt resting in bed upon arrival and requesting to get OOB if possible. Pt aware of low BP issues previous day. BP 105/71.Ted hose and ACE wraps applied to BLE. BP 139/90 and pt c/o HA with HOB elevated to prepare for transfer to w/c. BP rechecked 149/82. Pt returned to supine 128/71 and ACE wraps removed. HOB elevated and BP 147/84. Pt returned to supine BP and ted hose removed. BP 113/73. LPN Al aware of elevated BP and report of HA. Pt positioned in bed with LPN Al at bedside.  Therapy Documentation Precautions:  Precautions Precautions: Fall Precaution Comments: C5 SCI, Asia A, trach Restrictions Weight Bearing Restrictions: No  Pain: Pt denies pain this morning    Therapy/Group: Individual Therapy  Rich Brave 05/19/2021, 11:02 AM
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
## 2021-05-19 NOTE — Progress Notes (Signed)
Pt refused CPT. RT will cont to monitor. Currently clear and diminished breath sounds sat 98 on ATC.

## 2021-05-20 ENCOUNTER — Inpatient Hospital Stay (HOSPITAL_COMMUNITY): Payer: 59

## 2021-05-20 DIAGNOSIS — G8250 Quadriplegia, unspecified: Secondary | ICD-10-CM | POA: Diagnosis not present

## 2021-05-20 NOTE — Progress Notes (Signed)
Occupational Therapy Session Note  Patient Details  Name: Luke Wright MRN: [id number] Date of Birth: 10-Jan-1971  Today's Date: 05/21/2021 OT Individual Time: 3646-8032 OT Individual Time Calculation (min): 55 min   Skilled Therapeutic Interventions/Progress Updates:    Pt greeted in bed with no c/o pain. BP while semi reclined in bed 102/66. Per RN, abdominal binder not yet ordered but she was planning to order it. Due to acute/hydrotherapy right after session, decided to complete session bedlevel. Worked on UE/LE stretching for tone management, neutral warmth applied to lengthen tight muscles, especially in B UEs. We also played meaningful music for the same therapeutic effect. Discussed with pt importance of directing family to complete these stretches at home for tone mgt. He verbalized understanding. Pt remained in bed at close of session, wearing prevalon boots, call bell behind head, satting at 94%.   Therapy Documentation Precautions:  Precautions Precautions: Fall Precaution Comments: C5 SCI, [place] A, trach Restrictions Weight Bearing Restrictions: No Vital Signs: Therapy Vitals Pulse Rate: 98 Resp: (!) 22 Patient Position (if appropriate): Lying Oxygen Therapy SpO2: 99 % O2 Device: Tracheostomy Collar O2 Flow Rate (L/min): 10 L/min FiO2 (%): 60 % Pain: Pain Assessment Pain Scale: 0-10 Pain Score: 0-No pain ADL: ADL Equipment Provided: Feeding equipment Eating: Dependent (simulated at eval) Grooming: Dependent (simulated at eval) Upper Body Bathing: Dependent (simulated at eval) Lower Body Bathing: Dependent (simulated at eval) Upper Body Dressing: Dependent (simulated at eval) Lower Body Dressing: Dependent (simulated at eval) Toileting: Not assessed Toilet Transfer: Not assessed (not appropriate at this time) ADL Comments: note all ADLs simulated 2/2 time constraints  Therapy/Group: Individual Therapy  Anchor Dwan A Jacqui Headen 05/21/2021, 12:52 PM

## 2021-05-20 NOTE — Progress Notes (Signed)
Physical Therapy Session Note  Patient Details  Name: Luke Wright MRN: [id number] Date of Birth: [birth year]  Today's Date: 05/20/2021 PT Individual Time: 1315-1330 PT Individual Time Calculation (min): 15 min   Short Term Goals: Week 4:  PT Short Term Goal 1 (Week 4): =LTG due to ELOS  Skilled Therapeutic Interventions/Progress Updates:    Pt seen for short session to assess MAS in BUE. See Flowsheet below for details. Pt received seated in bed, agreeable to PT session. Pt reports ongoing pain in BUE, not rated. Utilized repositioning for comfort and pain management this date. Assisted pt with removing BUE splints and re-assessed tone in B elbows (R-4, L-3). Pt lopsided in bed and has slid down in bed. Located assist x 2 and repositioned pt in bed for improved positioning and comfort. Pt has some trouble coughing when placed in supine position and continues to have difficulty once HOB elevated. Pt declines a quad cough, requesting suctioning. Nursing notified and able to assist pt with suction. Pt left seated in bed with needs in reach at end of session.  Therapy Documentation Precautions:  Precautions Precautions: Fall Precaution Comments: C5 SCI, [place] A, trach Restrictions Weight Bearing Restrictions: No    Extremity Assessment  RUE Tone RUE Tone: Modified Ashworth Body Part - Modified Ashworth Scale: Elbow Elbow - Modified Ashworth Scale for Grading Hypertonia RUE: Affected part(s) rigid in flexion or extension Modified Ashworth Scale for Grading Hypertonia RUE: Affected part(s) rigid in flexion or extension LUE Tone LUE Tone: Modified Ashworth Body Part - Modified Ashworth Scale: Elbow Elbow - Modified Ashworth Scale for Grading Hypertonia LUE: Considerable increase in muschle tone, passive movement difficult Modified Ashworth Scale for Grading Hypertonia LUE: Considerable increase in muschle tone, passive movement difficult    Therapy/Group: Individual  Therapy   Peter Congo, PT, DPT, CSRS  05/20/2021, 3:27 PM
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
## 2021-05-21 LAB — CBC WITH DIFFERENTIAL/PLATELET
Abs Immature Granulocytes: 0.04 10*3/uL (ref 0.00–0.07)
Basophils Absolute: 0 10*3/uL (ref 0.0–0.1)
Basophils Relative: 0 %
Eosinophils Absolute: 0.2 10*3/uL (ref 0.0–0.5)
Eosinophils Relative: 2 %
HCT: 32.1 % — ABNORMAL LOW (ref 39.0–52.0)
Hemoglobin: 10.1 g/dL — ABNORMAL LOW (ref 13.0–17.0)
Immature Granulocytes: 0 %
Lymphocytes Relative: 26 %
Lymphs Abs: 3.1 10*3/uL (ref 0.7–4.0)
MCH: 26.4 pg (ref 26.0–34.0)
MCHC: 31.5 g/dL (ref 30.0–36.0)
MCV: 84 fL (ref 80.0–100.0)
Monocytes Absolute: 1.1 10*3/uL — ABNORMAL HIGH (ref 0.1–1.0)
Monocytes Relative: 9 %
Neutro Abs: 7.5 10*3/uL (ref 1.7–7.7)
Neutrophils Relative %: 63 %
Platelets: 323 10*3/uL (ref 150–400)
RBC: 3.82 MIL/uL — ABNORMAL LOW (ref 4.22–5.81)
RDW: 16.5 % — ABNORMAL HIGH (ref 11.5–15.5)
WBC: 11.9 10*3/uL — ABNORMAL HIGH (ref 4.0–10.5)
nRBC: 0 % (ref 0.0–0.2)

## 2021-05-21 LAB — BASIC METABOLIC PANEL
Anion gap: 10 (ref 5–15)
BUN: 11 mg/dL (ref 6–20)
CO2: 24 mmol/L (ref 22–32)
Calcium: 9 mg/dL (ref 8.9–10.3)
Chloride: 100 mmol/L (ref 98–111)
Creatinine, Ser: 0.58 mg/dL — ABNORMAL LOW (ref 0.61–1.24)
GFR, Estimated: >60 mL/min (ref >60)
Glucose, Bld: 110 mg/dL — ABNORMAL HIGH (ref 70–99)
Potassium: 3.6 mmol/L (ref 3.5–5.1)
Sodium: 134 mmol/L — ABNORMAL LOW (ref 135–145)

## 2021-05-21 NOTE — Progress Notes (Signed)
Occupational Therapy Session Note  Patient Details  Name: Luke Wright MRN: [id number] Date of Birth: 11/12/1970  Today's Date: 05/21/2021 OT Individual Time: 1430-1511 OT Individual Time Calculation (min): 41 min    Short Term Goals: Week 3:  OT Short Term Goal 1 (Week 3): Pt will self feed with AE PRN with Max A OT Short Term Goal 1 - Progress (Week 3): Progressing toward goal OT Short Term Goal 2 (Week 3): Pt will brush teeth with AE with Max A OT Short Term Goal 2 - Progress (Week 3): Progressing toward goal OT Short Term Goal 3 (Week 3): Pt will roll L/R with 1 assist to decrease caregiver burden OT Short Term Goal 3 - Progress (Week 3): Progressing toward goal Week 4:  OT Short Term Goal 1 (Week 4): Pt will self feed with AE PRN with Max A OT Short Term Goal 2 (Week 4): Pt will brush teeth with AE with Max A OT Short Term Goal 3 (Week 4): Pt will brush teeth with AE with Max A   Skilled Therapeutic Interventions/Progress Updates:    Pt greeted at time of session supine in bed resting but easily woken with tactile stimuli. No pain reported. Initial part of session focused on putting on TEDS and ACE wraps for BLEs and BP checked at 115/64. Focus of session on maximove transfer bed > power chair with 2nd helper with rolling L/R for maximove pad placement, pt able to direct his care for placement of Ue's and bed rails. Once up in chair checked BP semireclined at 93/62, tilted back further in chair and rechecked at 102/64. Pt asymptomatic throughout and able to direct care during positioning, pad placement, transfer, etc. Pt up in power chair with soft call in reach and nursing aware pt up.    Therapy Documentation Precautions:  Precautions Precautions: Fall Precaution Comments: C5 SCI, Asia A, trach Restrictions Weight Bearing Restrictions: No    Therapy/Group: Individual Therapy  Erasmo Score 05/21/2021, 7:27 AM

## 2021-05-21 NOTE — Progress Notes (Signed)
Patient ID: Luke Wright, male   DOB: Feb 04, 1971, 51 y.o.   MRN: [id number]  Set up trach care education with respiratory for Tuesday, 05/22/2021, at 3 PM with patient's daughter. Sherri with respiratory confirmed.  Kennyth Arnold, RN3, BSN, CBIS, CRRN, [facility], Inpatient Rehabilitation Office [phone] Cell [phone]

## 2021-05-21 NOTE — Progress Notes (Signed)
Physical Therapy Wound Treatment Patient Details  Name: Luke Wright MRN: [id number] Date of Birth: 1970-12-07  Today's Date: 05/21/2021 PT Individual Time: 1022-1102 PT Individual Time Calculation (min): 40 min   Subjective  Subjective: Pt pleasant and agreeable to hydrotherapy. Patient and Family Stated Goals: None stated Date of Onset:  (Unknown) Prior Treatments: Dressing changes; Santyl  Pain Score: Pt with RUE pain during positioning however was able to tolerate session without repositioning.   Wound Assessment  Pressure Injury 04/23/21 Buttocks Left Unstageable - Full thickness tissue loss in which the base of the injury is covered by slough (yellow, tan, gray, green or brown) and/or eschar (tan, brown or black) in the wound bed. Unstageable to left buttock wit (Active)  Dressing Type Barrier Film (skin prep);Foam - Lift dressing to assess site every shift;Gauze (Comment);Moist to moist;Santyl 05/21/21 1330  Dressing Changed;Clean;Dry;Intact 05/21/21 1330  Dressing Change Frequency Daily 05/21/21 1330  State of Healing Early/partial granulation 05/21/21 1330  Site / Wound Assessment Red;Yellow;Brown;Dusky 05/21/21 1330  % Wound base Red or Granulating 60% 05/21/21 1330  % Wound base Yellow/Fibrinous Exudate 25% 05/21/21 1330  % Wound base Black/Eschar 5% 05/21/21 1330  % Wound base Other/Granulation Tissue (Comment) 10% 05/21/21 1330  Peri-wound Assessment Intact;Pink 05/21/21 1330  Wound Length (cm) 10 cm 05/16/21 1100  Wound Width (cm) 4 cm 05/16/21 1100  Wound Depth (cm) 3.5 cm 05/16/21 1100  Wound Surface Area (cm^2) 40 cm^2 05/16/21 1100  Wound Volume (cm^3) 140 cm^3 05/16/21 1100  Tunneling (cm) 0 05/09/21 1148  Undermining (cm) 12*  3.5 cm 05/16/21 1100  Margins Unattached edges (unapproximated) 05/21/21 1330  Drainage Amount Minimal 05/21/21 1330  Drainage Description Serosanguineous 05/21/21 1330  Treatment Debridement (Selective);Hydrotherapy (Pulse  lavage);Packing (Saline gauze) 05/21/21 1330      Hydrotherapy Pulsed lavage therapy - wound location: L buttock Pulsed Lavage with Suction (psi): 12 psi Pulsed Lavage with Suction - Normal Saline Used: 1000 mL Pulsed Lavage Tip: Tip with splash shield Selective Debridement Selective Debridement - Location: L buttock Selective Debridement - Tools Used: Forceps;Scalpel Selective Debridement - Tissue Removed: Yellow unviable tissue   Wound Assessment and Plan  Wound Therapy - Assess/Plan/Recommendations Wound Therapy - Clinical Statement: Progressing with debridement mainly in the undermining area of the wound (~12:00-3:00). He will benefit from continued hydrotherapy for selective removal of unviable tissue, to decrease bioburden, and promote wound bed healing. Wound Therapy - Functional Problem List: SCI - decreased tolerance for position changes; immobility Factors Delaying/Impairing Wound Healing: Immobility;Altered sensation Hydrotherapy Plan: Debridement;Patient/family education;Dressing change;Pulsatile lavage with suction Wound Therapy - Frequency: 6X / week Wound Therapy - Current Recommendations: PT Wound Therapy - Follow Up Recommendations: dressing changes by RN  Wound Therapy Goals- Improve the function of patient's integumentary system by progressing the wound(s) through the phases of wound healing (inflammation - proliferation - remodeling) by: Decrease Necrotic Tissue to: 20% Decrease Necrotic Tissue - Progress: Progressing toward goal Increase Granulation Tissue to: 80% Increase Granulation Tissue - Progress: Progressing toward goal Improve Drainage Characteristics: Min;Serous Improve Drainage Characteristics - Progress: Progressing toward goal Goals/treatment plan/discharge plan were made with and agreed upon by patient/family: Yes Time For Goal Achievement: 7 days Wound Therapy - Potential for Goals: Good  Goals will be updated until maximal potential achieved or  discharge criteria met.  Discharge criteria: when goals achieved, discharge from hospital, MD decision/surgical intervention, no progress towards goals, refusal/missing three consecutive treatments without notification or medical reason.  GP     Thelma Comp  05/21/2021, 1:37 PM  Rolinda Roan, PT, DPT Acute Rehabilitation Services Pager: [phone] Office: [phone]

## 2021-05-21 NOTE — Progress Notes (Signed)
Physical Therapy Session Note  Patient Details  Name: Luke Wright MRN: [id number] Date of Birth: [birth year]  Today's Date: 05/21/2021 PT Individual Time: 1600-1700 PT Individual Time Calculation (min): 60 min   Short Term Goals: Week 4:  PT Short Term Goal 1 (Week 4): =LTG due to ELOS  Skilled Therapeutic Interventions/Progress Updates:    Pt received seated in PWC in room, agreeable to PT session. No complaints of pain this date. Semi-reclined BP in PWC 91/58 with thigh high TEDs and BLE ACE wrap, asymptomatic. Assisted pt with transitioning to more upright position for driving, BP 47/42. Pt remains asymptomatic but assisted him with donning abdominal binder and explained purpose for assisting to keep BP more elevated. PWC mobility up to 200 ft this session via head array controls, min cueing for steering and safety. Pt able to navigate through cone obstacle course weaving in/out with cues for how to safely navigate tight turns with front-wheel drive. Pt requests to return to bed at end of session. Maxi move transfer PWC to bed. Rolling L/R with total A x 2 for removal of sling and abdominal binder. Assisted pt with doffing TEDs and ACE wrap. Pt left seated in bed with needs in reach at end of session.  Therapy Documentation Precautions:  Precautions Precautions: Fall Precaution Comments: C5 SCI, Asia A, trach Restrictions Weight Bearing Restrictions: No     Therapy/Group: Individual Therapy   Peter Congo, PT, DPT, CSRS  05/21/2021, 5:14 PM

## 2021-05-21 NOTE — Progress Notes (Signed)
Bowel program started on previous shift. Suppository was given, but no bowel movement noted upon assessment. This RN performed digital stimulation. No stool noted in the rectum. Bed pad and brief changed at this time, and the patient was repositioned on the right side. Will continue to monitor. Cristino Martes, RN
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
## 2021-05-21 NOTE — Progress Notes (Signed)
Patient ID: Luke Wright, male   DOB: 1971/03/31, 50 y.o.   MRN: [id number]  Covering for primary SW, Auria.   Patient declined by Iantha Fallen due to staffing. SW will cont to follow up

## 2021-05-21 NOTE — Progress Notes (Signed)
Orthopedic Tech Progress Note Patient Details:  Luke Wright January 30, 1971 [id number]  SECRETARY called requesting an ABDOMINAL BINDER for patient  Ortho Devices Type of Ortho Device: Abdominal binder Ortho Device/Splint Location: STOMACH Ortho Device/Splint Interventions: Ordered   Post Interventions Patient Tolerated: Well Instructions Provided: Care of device  Donald Pore 05/21/2021, 11:41 AM

## 2021-05-22 NOTE — Progress Notes (Signed)
Occupational Therapy Session Note  Patient Details  Name: Luke Wright MRN: [id number] Date of Birth: 08-30-1970  Today's Date: 05/22/2021 OT Individual Time: 1000-1055 OT Individual Time Calculation (min): 55 min    Short Term Goals: Week 4:  OT Short Term Goal 1 (Week 4): Pt will self feed with AE PRN with Max A OT Short Term Goal 2 (Week 4): Pt will brush teeth with AE with Max A OT Short Term Goal 3 (Week 4): Pt will brush teeth with AE with Max A  Skilled Therapeutic Interventions/Progress Updates:    OT interventin with focus on bed mobility, directing care, OOB tolerance. Bed mobility with tot A+2. Ted hose and ACE wraps applied to BLE. Abdominal binder applied. BP prior-113/76, in w/c 89/65 asymptomatic. Transfer via mechanical lift. Pt min A for PWC mobility. Head array off center and pt had to continually make adjustments in path. Pt returned to room and remained in PWC. All needs within reach.  Therapy Documentation Precautions:  Precautions Precautions: Fall Precaution Comments: C5 SCI, Asia A, trach Restrictions Weight Bearing Restrictions: No Pain: Pain Assessment Pain Scale: 0-10 Pain Score: 0-No pain   Therapy/Group: Individual Therapy  Rich Brave 05/22/2021, 12:21 PM
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
## 2021-05-22 NOTE — Progress Notes (Signed)
Physical Therapy Session Note  Patient Details  Name: Luke Wright MRN: [id number] Date of Birth: 18-Sep-1970  Today's Date: 05/22/2021 PT Individual Time: 0623-7628; 1415-1500 PT Individual Time Calculation (min): 31 min and 45 min PT Missed Time: 15 min Missed Time Reason: pt with respiratory therapy performing family education  Short Term Goals: Week 4:  PT Short Term Goal 1 (Week 4): =LTG due to ELOS  Skilled Therapeutic Interventions/Progress Updates:    Session 1: Pt received seated in PWC in room, agreeable to PT session. Fleet Contras, ATP present to adjust steering controls as pt exhibits difficulty with steering and driving via head array controls this date. Adjusted head rest and provided cueing for patient regarding how to safely perform straight L/R turns as well as discussion of "overcorrection" when driving. Pt able to drive up to 315 ft with Supervision cueing. Pt left semi-reclined in PWC in room with needs in reach at end of session.  MAS assessed to be 4 in R elbow and 3 in L elbow, see Flowsheet below.  Session 2: Pt missed 15 min of scheduled therapy session to allow time for respiratory therapy to complete their family training with pt's daughter. Pt received seated in PWC in room, daughter present. Reviewed blood pressure changes/issues including AD and OH, provided handouts. Also reviewed ACE-wrapping BLE and provided handouts. Remainder of session focus on PWC driving at Supervision level x 200 ft via head array controls. Pt requests to return to bed at end of session due to fatigue. Maxi move transfer back to bed. Reviewed positioning of PWC with pt's daughter to prevent transfer sling catching on head rest and moving controls. Rolling L/R with total A x 2 for removal of sling and abdominal binder. Pt left seated in bed with needs in reach, daughter present at end of session.  Therapy Documentation Precautions:  Precautions Precautions: Fall Precaution Comments:  C5 SCI, Asia A, trach Restrictions Weight Bearing Restrictions: No Extremity Assessment  RUE Tone RUE Tone: Modified Ashworth Body Part - Modified Ashworth Scale: Elbow Elbow - Modified Ashworth Scale for Grading Hypertonia RUE: Affected part(s) rigid in flexion or extension Modified Ashworth Scale for Grading Hypertonia RUE: Affected part(s) rigid in flexion or extension LUE Tone LUE Tone: Modified Ashworth Body Part - Modified Ashworth Scale: Elbow Elbow - Modified Ashworth Scale for Grading Hypertonia LUE: Considerable increase in muschle tone, passive movement difficult Modified Ashworth Scale for Grading Hypertonia LUE: Considerable increase in muschle tone, passive movement difficult    Therapy/Group: Individual Therapy   Peter Congo, PT, DPT, CSRS  05/22/2021, 12:37 PM

## 2021-05-22 NOTE — Progress Notes (Signed)
Occupational Therapy Session Note  Patient Details  Name: Luke Wright MRN: [id number] Date of Birth: 09-25-1970  Today's Date: 05/22/2021 OT Individual Time: 1330-1400 OT Individual Time Calculation (min): 30 min  and Today's Date: 05/22/2021 OT Missed Time: 30 Minutes Missed Time Reason: Other (comment) (respiratory care)   Short Term Goals: Week 4:  OT Short Term Goal 1 (Week 4): Pt will self feed with AE PRN with Max A OT Short Term Goal 2 (Week 4): Pt will brush teeth with AE with Max A OT Short Term Goal 3 (Week 4): Pt will brush teeth with AE with Max A  Skilled Therapeutic Interventions/Progress Updates:    Pt's daughter present for education. Daughter reports that she and her boyfriend practiced using the hoyer lift and were comfortable with using it for tranfsers. Discussed BUE PROM. Answered all questions. Pt's daughter feeding pt lunch. Educated pt and daughter on importance of small bites and sips of liquid. Daughter verbalized understanding. Pt remained in PWC awaiting PT.  Therapy Documentation Precautions:  Precautions Precautions: Fall Precaution Comments: C5 SCI, Asia A, trach Restrictions Weight Bearing Restrictions: No General: General OT Amount of Missed Time: 30 Minutes Pain: Pain Assessment Pain Scale: 0-10 Pain Score: 0-No pain   Therapy/Group: Individual Therapy  Rich Brave 05/22/2021, 2:05 PM

## 2021-05-22 NOTE — Progress Notes (Signed)
11/8-Trach education done with daughter [name].  Explained daily trach care. She was able to verbalize understanding and able to demonstrate trach care. She suctioned the trach while using sterile/clean technique. No issues noted during education and suctioning. Daughter asked appropriate questions. Additional teaching booklet left with daughter also. Both patient and daughter appear comfortable with trach care. Contact numbers left with daughter just in case she has any additional questions.  No suction set up in the home yet. I left a message for the nurse. This will need to be set up in the home before the patient is discharged.

## 2021-05-22 NOTE — Progress Notes (Signed)
Wound care education done by RN with patient's daughter. RN went over supplies needed, how often dressing should be changed and sterile technique. Daughter had no further questions. She was unable to stay for bowel program education. She said she would be back tomorrow for bowel program.

## 2021-05-22 NOTE — Progress Notes (Signed)
Patient ID: Luke Wright, male   DOB: 1970/10/08, 50 y.o.   MRN: [id number]  SW left message for Brendan/Clinical Manager with [employer] [phone]) to discuss status of referral and waiting on follow-up.    SW sent out [facility] referral again to see if there are any agencies willing to accept. SW waiting on follow-up.  SW met with pt and pt dtr Luke Wright in room to provide updates on challenges with obtaining HH. They are aware that pt may need to stay here due to care needs he requires. He would not like SNF. SW discussed potential barriers with insurance and if continued progress can be made here in rehab in the future pending if unable to find an agency. DME delivered- hospital bed, hoyer lift, portable sux (in room), and trach care kits and supplies. SW informed will order new trachs for current size since trach size changed. SW informed will arrange for ambulance transportation on Friday at 11am in the event able to secure a HHA. Pt reported being tired of being in the hospital and used to being outside. SW discussed asking about a ground pass from medical team. Pt dtr states home visit from [facility] went well for her yesterday and she was informed they are still looking for someone to staff his care needs.   *SW ordered 6CFS trachs with [employer] via parachute.   Declines HHAs Carolyn/[facility] Cory/[facility] [place], MSW, [place] Office: [phone] Cell: [phone] Fax: [phone]

## 2021-05-22 NOTE — Consult Note (Signed)
WOC Nurse wound follow up Patient receiving care in [facility] [id number] This patient has been receiving Hydrotherapy followed by Santyl dressings on M/W/F then Santyl dressings followed by moistened saline gauze and secured with foam dressing all other days. Planned discharge date is 05/25/21. We will continue the Hydrotherapy this Wed and Friday before discharge then starting on 05/26/21 will change to the following: Apply Santyl to the wound in a nickel thick layer, cover with moistened saline gauze and secure with foam dressing or ABD pad and tape. Change twice daily. WOC will not follow at this time but are available to this patient and medical team. Please re-consult if needed.   Renaldo Reel Katrinka Blazing, MSN, RN, CMSRN, Angus Seller, [facility] Wound Treatment Associate Pager [phone]

## 2021-05-22 NOTE — Patient Care Conference (Signed)
Inpatient RehabilitationTeam Conference and Plan of Care Update Date: 05/22/2021   Time: 11:29 AM    Patient Name: Luke Wright      Medical Record Number: [id number]  Date of Birth: [birth year] Sex: Male         Room/Bed: 4W20C/4W20C-01 Payor Info: Payor: [employer]vertising copywriter MEDICARE / Plan: Suan Halter DUAL COMPLETE / Product Type: *No Product type* /    Admit Date/Time:  04/23/2021  2:17 PM  Primary Diagnosis:  Quadriplegia, unspecified [facility])  Hospital Problems: Principal Problem:   Quadriplegia, unspecified (HCC) Active Problems:   Pressure injury of skin   Pressure injury of sacral region, unstageable (HCC)   Hypoalbuminemia due to protein-calorie malnutrition (HCC)   Neurogenic bowel   Acute blood loss anemia   Essential hypertension   Dysphagia   Leukocytosis   Hypokalemia   Tracheostomy care [facility])    Expected Discharge Date: Expected Discharge Date: 05/25/21  Team Members Present: Physician leading conference: Dr. Genice Rouge Social Worker Present: [name], LCSWA Nurse Present: Kennyth Arnold, RN PT Present: Peter Congo, Grayland Ormond, PT OT Present: Ardis Rowan, COTA;Jennifer Katrinka Blazing, OT PPS Coordinator present : Fae Pippin, SLP     Current Status/Progress Goal Weekly Team Focus  Bowel/Bladder   Indwelling catheter in place. Bowel Program. Last bm: 11/7  Family members will learn bowel program and I&O cathing  Patient will remain on bowel program   Swallow/Nutrition/ Hydration             ADL's   BADLs-dependent; rolling R/L in bed with tot A+2; pt directs care indepdnently; sitting balance-tot A+2; daughter has particiapted in some educaiton  Max/total bathe/dress, Mod self feed/oral hygiene with AE to be downgraded  family educaiton, bed mobility, sitting balance, directing care   Mobility   total A x 2 rolling and supine to/from sit, transfer via maxi move, PWC mobility Supervision with head array controls  dependent for transfers,  mod I PWC, pt indep to direct family/caregivers with transfers and pressure relief  PWC mobility and education, SCI edu, family edu and training, d/c planning   Communication             Safety/Cognition/ Behavioral Observations            Pain   Patient reports 8/10 to R arm. Current medication regimen and muscle rub effective  <3  Assess pain Q shift and PRN   Skin   Unstageble to L gluteal  Patient refuses Q 2 hour rotation. Agreed to Q 4 hour rotation.  Assess skin Q shift and PRN.     Discharge Planning:  D/c to home with his dtr who will provide 24/7 care even with caring for 6 children under 5 y.o. Family mtg conducted. Pending services for private duty nursing since waiting on updates from agency. Challenges remain with obtaining HH due to pt care needs.   Team Discussion: Ordering bilateral wrist splints. WOC to educate daughter on dressing changes. Foley in place, continue bowel program. Will receive hydro-therapy until discharge. Respiratory to provide trach care education. Pain still 8/10 in arms. Discharging home with daughter. Family education completed with daughter. Reviewing goals but is on target. More orthostatic this week. Therapy to teach autonomic dysreflexia s/s to daughter today. Patient on target to meet rehab goals: yes  *See Care Plan and progress notes for long and short-term goals.   Revisions to Treatment Plan:  Increase ITB pump before discharge  Teaching Needs: Family education, medication/pain management, skin/wound care, trach care education,  foley care education, bowel program education, AD education, transfer training, etc.  Current Barriers to Discharge: Decreased caregiver support, Home enviroment access/layout, Trach, Neurogenic bowel and bladder, Wound care, Lack of/limited family support, Weight, Weight bearing restrictions, Medication compliance, and Nutritional means  Possible Resolutions to Barriers: Family education Order DME Follow up  wound care, trach care Follow-up HH/Outpatient therapy     Medical Summary Current Status: daughter to learn bowel program and trach training and foleyt flushing- pain arms- increase ITB pump before d/c-  Barriers to Discharge: Decreased family/caregiver support;Home enviroment access/layout;Weight bearing restrictions;Wound care;Trach;Medical stability  Barriers to Discharge Comments: barrier- might not have H/H- still trying to get them Possible Resolutions to [employer] Focus: daughter has done PT/OT training and SCI- increase ITB pump QOD- has loaner w/c- reviewing old OT goals- more orthostatic per PT- AD on weekend- goals- d/c 11/11   Continued Need for Acute Rehabilitation Level of Care: The patient requires daily medical management by a physician with specialized training in physical medicine and rehabilitation for the following reasons: Direction of a multidisciplinary physical rehabilitation program to maximize functional independence : Yes Medical management of patient stability for increased activity during participation in an intensive rehabilitation regime.: Yes Analysis of laboratory values and/or radiology reports with any subsequent need for medication adjustment and/or medical intervention. : Yes   I attest that I was present, lead the team conference, and concur with the assessment and plan of the team.   Tennis Must 05/22/2021, 5:14 PM

## 2021-05-22 NOTE — Progress Notes (Signed)
Bowel program started at 1830. No bowel movement as of this time. Night nurse will complete bowel program.
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
## 2021-05-23 DIAGNOSIS — G8250 Quadriplegia, unspecified: Secondary | ICD-10-CM | POA: Diagnosis not present

## 2021-05-23 LAB — CBC WITH DIFFERENTIAL/PLATELET
Abs Immature Granulocytes: 0.05 10*3/uL (ref 0.00–0.07)
Basophils Absolute: 0.1 10*3/uL (ref 0.0–0.1)
Basophils Relative: 1 %
Eosinophils Absolute: 0.4 10*3/uL (ref 0.0–0.5)
Eosinophils Relative: 4 %
HCT: 32.4 % — ABNORMAL LOW (ref 39.0–52.0)
Hemoglobin: 10.3 g/dL — ABNORMAL LOW (ref 13.0–17.0)
Immature Granulocytes: 1 %
Lymphocytes Relative: 25 %
Lymphs Abs: 2.5 10*3/uL (ref 0.7–4.0)
MCH: 26.7 pg (ref 26.0–34.0)
MCHC: 31.8 g/dL (ref 30.0–36.0)
MCV: 83.9 fL (ref 80.0–100.0)
Monocytes Absolute: 0.8 10*3/uL (ref 0.1–1.0)
Monocytes Relative: 8 %
Neutro Abs: 6 10*3/uL (ref 1.7–7.7)
Neutrophils Relative %: 61 %
Platelets: 335 10*3/uL (ref 150–400)
RBC: 3.86 MIL/uL — ABNORMAL LOW (ref 4.22–5.81)
RDW: 16.2 % — ABNORMAL HIGH (ref 11.5–15.5)
WBC: 9.8 10*3/uL (ref 4.0–10.5)
nRBC: 0 % (ref 0.0–0.2)

## 2021-05-23 LAB — BASIC METABOLIC PANEL
Anion gap: 9 (ref 5–15)
BUN: 9 mg/dL (ref 6–20)
CO2: 24 mmol/L (ref 22–32)
Calcium: 9.1 mg/dL (ref 8.9–10.3)
Chloride: 100 mmol/L (ref 98–111)
Creatinine, Ser: 0.54 mg/dL — ABNORMAL LOW (ref 0.61–1.24)
GFR, Estimated: >60 mL/min (ref >60)
Glucose, Bld: 100 mg/dL — ABNORMAL HIGH (ref 70–99)
Potassium: 4 mmol/L (ref 3.5–5.1)
Sodium: 133 mmol/L — ABNORMAL LOW (ref 135–145)

## 2021-05-23 NOTE — Progress Notes (Signed)
Wound therapy done today with hydrotherapy. Measurements done.   Luke Wright

## 2021-05-23 NOTE — Progress Notes (Signed)
At 2015, noted large amount of soft brown stool,patient cleaned and digital stimulation done, no stool noted post digital stimulation.

## 2021-05-23 NOTE — Progress Notes (Signed)
Bowel program initiated @ 1800. Daughter present for teaching. No bowel movement at this time. Night nurse will complete bowel program.

## 2021-05-23 NOTE — Progress Notes (Signed)
Physical Therapy Session Note  Patient Details  Name: Luke Wright MRN: [id number] Date of Birth: [birth year]  Today's Date: 05/23/2021 PT Individual Time: 1600-1700 PT Individual Time Calculation (min): 60 min   Short Term Goals: Week 4:  PT Short Term Goal 1 (Week 4): =LTG due to ELOS  Skilled Therapeutic Interventions/Progress Updates:    Pt received seated in bed, agreeable to PT session. Pt reports he is fatigued this date and did not sleep well, worried about not being able to d/c home. Provided emotional support to patient. Encouraged pt to get up to [facility] this session as he has not been out of bed all day, pt declines due to fatigue. Pt agreeable to sit up to EOB. Assisted pt with donning abdominal binder, THT and ACE in place. Semi-reclined BP 98/74, pt asymptomatic. Supine to sit with total A x 2. Seated BP initially 125/78. Pt tolerates sitting EOB x 10 min with total A to maintain trunk control. Pt able to perform scap squeezes and shoulder elevation x 10 reps each while seated EOB. Attempted to perform shoulder PROM, pt with fair tolerance due to pain in shoulders with mobility. Pt reports UE are sore following splint session. Pt declines any pain medication at this time. Seated BP 103/76 after sitting x 10 min, pt asymptomatic. Pt returned to supine with total A x 2. Rolling L/R with total A x 2 for removal of abdominal binder. BLE PROM in available planes of motion to prevent contracture. Pt left seated in bed with needs in reach at end of session.  Therapy Documentation Precautions:  Precautions Precautions: Fall Precaution Comments: C5 SCI, [place] A, trach Restrictions Weight Bearing Restrictions: No     Therapy/Group: Individual Therapy   Peter Congo, PT, DPT, CSRS  05/23/2021, 5:38 PM

## 2021-05-23 NOTE — Progress Notes (Signed)
Physical Therapy Wound Treatment Patient Details  Name: Luke Wright MRN: [id number] Date of Birth: 1971-03-13  Today's Date: 05/23/2021 PT Individual Time: 1015-1059 PT Individual Time Calculation (min): 44 min   Subjective  Subjective: Pt pleasant and agreeable to hydrotherapy. Patient and Family Stated Goals: None stated Date of Onset:  (Unknown) Prior Treatments: Dressing changes; Santyl  Pain Score: Pain Score: 3   Wound Assessment  Pressure Injury 04/23/21 Buttocks Left Unstageable - Full thickness tissue loss in which the base of the injury is covered by slough (yellow, tan, gray, green or brown) and/or eschar (tan, brown or black) in the wound bed. Unstageable to left buttock wit (Active)  Wound Image   05/23/21 1132  Dressing Type Barrier Film (skin prep);Foam - Lift dressing to assess site every shift;Moist to dry;Normal saline moist dressing;Santyl 05/23/21 1132  Dressing Changed;Dry;Clean;Intact 05/23/21 1132  Dressing Change Frequency Daily 05/23/21 1132  State of Healing Eschar 05/23/21 1132  Site / Wound Assessment Red;Yellow 05/23/21 1132  % Wound base Red or Granulating 60% 05/23/21 1132  % Wound base Yellow/Fibrinous Exudate 40% 05/23/21 1132  % Wound base Black/Eschar 0% 05/23/21 1132  % Wound base Other/Granulation Tissue (Comment) 0% 05/23/21 1132  Peri-wound Assessment Intact 05/23/21 1132  Wound Length (cm) 10 cm 05/23/21 1132  Wound Width (cm) 4 cm 05/23/21 1132  Wound Depth (cm) 3.5 cm 05/16/21 1100  Wound Surface Area (cm^2) 40 cm^2 05/23/21 1132  Wound Volume (cm^3) 140 cm^3 05/16/21 1100  Tunneling (cm) 0 05/09/21 1148  Undermining (cm) 11-12*   3 cm 05/23/21 1132  Margins Unattached edges (unapproximated) 05/23/21 1132  Drainage Amount Minimal 05/23/21 1132  Drainage Description Serous;Sanguineous 05/23/21 1132  Treatment Cleansed;Debridement (Selective);Hydrotherapy (Pulse lavage);Packing (Saline gauze) 05/23/21 1132      Hydrotherapy Pulsed  lavage therapy - wound location: L buttock Pulsed Lavage with Suction (psi): 12 psi Pulsed Lavage with Suction - Normal Saline Used: 1000 mL Pulsed Lavage Tip: Tip with splash shield Selective Debridement Selective Debridement - Location: L buttock Selective Debridement - Tools Used: Forceps;Scalpel Selective Debridement - Tissue Removed: Yellow unviable tissue   Wound Assessment and Plan  Wound Therapy - Assess/Plan/Recommendations Wound Therapy - Clinical Statement: Progressing with debridement mainly in the undermining area of the wound (~12:00-3:00). He will benefit from continued hydrotherapy for selective removal of unviable tissue, to decrease bioburden, and promote wound bed healing. Wound Therapy - Functional Problem List: SCI - decreased tolerance for position changes; immobility Factors Delaying/Impairing Wound Healing: Immobility;Altered sensation Hydrotherapy Plan: Debridement;Patient/family education;Dressing change;Pulsatile lavage with suction Wound Therapy - Frequency: 6X / week Wound Therapy - Current Recommendations: PT Wound Therapy - Follow Up Recommendations: dressing changes by RN  Wound Therapy Goals- Improve the function of patient's integumentary system by progressing the wound(s) through the phases of wound healing (inflammation - proliferation - remodeling) by: Decrease Necrotic Tissue to: 20% Decrease Necrotic Tissue - Progress: Progressing toward goal Increase Granulation Tissue to: 80% Increase Granulation Tissue - Progress: Progressing toward goal Improve Drainage Characteristics: Min;Serous Improve Drainage Characteristics - Progress: Progressing toward goal Goals/treatment plan/discharge plan were made with and agreed upon by patient/family: Yes Time For Goal Achievement: 7 days Wound Therapy - Potential for Goals: Good  Goals will be updated until maximal potential achieved or discharge criteria met.  Discharge criteria: when goals achieved, discharge  from hospital, MD decision/surgical intervention, no progress towards goals, refusal/missing three consecutive treatments without notification or medical reason.  GP     Tessie Fass Ervin Hensley 05/23/2021, 11:37  AM 05/23/2021  Ginger Carne., PT Acute Rehabilitation Services [phone]  (pager) [phone]  (office)

## 2021-05-23 NOTE — Progress Notes (Signed)
At 2000, bowel stimulation rectally done, no noted stool at the moment, patient able to pass gas.

## 2021-05-23 NOTE — Progress Notes (Signed)
Occupational Therapy Session Note  Patient Details  Name: Luke Wright MRN: [id number] Date of Birth: [birth year]  Today's Date: 05/23/2021 OT Individual Time: 9163-8466 OT Individual Time Calculation (min): 70 min    Short Term Goals: Week 4:  OT Short Term Goal 1 (Week 4): Pt will self feed with AE PRN with Max A OT Short Term Goal 2 (Week 4): Pt will brush teeth with AE with Max A OT Short Term Goal 3 (Week 4): Pt will brush teeth with AE with Max A  Skilled Therapeutic Interventions/Progress Updates:    OT intervention with focus on bed mobility, BUE PROM/AROM/AAROM. Pt with improved LUE/shoulder movement land able to bring hand to his mouth at bed level with HOB elevated. Dorsal wrist splint with ucuff applied to pt's Lt hand and pt practiced bringing fork to mouth to simulate self feeding. Ted hose and ACE wraps applied. Pt remained in bed with all needs within reach. Pt awaiting hydrotherapy.  Therapy Documentation Precautions:  Precautions Precautions: Fall Precaution Comments: C5 SCI, Asia A, trach Restrictions Weight Bearing Restrictions: No Pain: Pt denies pain this morning. Therapy/Group: Individual Therapy  Rich Brave 05/23/2021, 9:34 AM
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
## 2021-05-23 NOTE — Progress Notes (Signed)
Inpatient Rehabilitation Discharge Medication Review by a Pharmacist  A complete drug regimen review was completed for this patient to identify any potential clinically significant medication issues.  High Risk Drug Classes Is patient taking? Indication by Medication  Antipsychotic No   Anticoagulant Yes Possible Lovenox for VTE ppx  Antibiotic No   Opioid Yes Oxycodone for pain  Antiplatelet No   Hypoglycemics/insulin No   Vasoactive Medication Yes Lisinopril for BP  Chemotherapy No   Other Yes Baclofen/Zanaflex for muscle spasms Gabapentin/Elavil/Lidocaine patch for pain Ambien/melatonin for sleep     Type of Medication Issue Identified Description of Issue Recommendation(s)  Drug Interaction(s) (clinically significant)     Duplicate Therapy     Allergy     No Medication Administration End Date     Incorrect Dose     Additional Drug Therapy Needed     Significant med changes from prior encounter (inform family/care partners about these prior to discharge).    Other       Clinically significant medication issues were identified that warrant physician communication and completion of prescribed/recommended actions by midnight of the next day:  No   Pharmacist comments: None  Time spent performing this drug regimen review (minutes):  20 minutes   Elwin Sleight 05/23/2021 9:35 AM

## 2021-05-23 NOTE — Progress Notes (Signed)
Patient ID: Luke Wright, male   DOB: May 21, 1971, 50 y.o.   MRN: [id number]  SW returned phone call to pt dtr [name] who reported that [name] had established more time.  SW confirmed that current hours were M/T 8:30am-5pm and every Friday 11pm-7am and every other weekend beginning on 11/19. Family would still like for pt to go home. SW updated medical team with changes.    [name], MSW, LCSWA Office: [phone] Cell: [phone] Fax: [phone]

## 2021-05-24 ENCOUNTER — Telehealth: Payer: Self-pay

## 2021-05-24 ENCOUNTER — Other Ambulatory Visit (HOSPITAL_COMMUNITY): Payer: Self-pay

## 2021-05-24 DIAGNOSIS — G629 Polyneuropathy, unspecified: Secondary | ICD-10-CM

## 2021-05-24 DIAGNOSIS — G894 Chronic pain syndrome: Secondary | ICD-10-CM

## 2021-05-24 DIAGNOSIS — G8250 Quadriplegia, unspecified: Secondary | ICD-10-CM | POA: Diagnosis not present

## 2021-05-24 LAB — BLOOD GAS, ARTERIAL
Acid-Base Excess: 0.8 mmol/L (ref 0.0–2.0)
Bicarbonate: 24.8 mmol/L (ref 20.0–28.0)
Drawn by: 52388
FIO2: 21
O2 Saturation: 93.6 %
Patient temperature: 37
pCO2 arterial: 39.3 mmHg (ref 32.0–48.0)
pH, Arterial: 7.416 (ref 7.350–7.450)
pO2, Arterial: 68.5 mmHg — ABNORMAL LOW (ref 83.0–108.0)

## 2021-05-24 MED ORDER — BISACODYL 10 MG RE SUPP
10.0000 mg | Freq: Every day | RECTAL | 0 refills | Status: DC
  Filled 2021-05-24: qty 30, 30d supply, fill #0

## 2021-05-24 MED ORDER — AMITRIPTYLINE HCL 25 MG PO TABS
25.0000 mg | ORAL_TABLET | Freq: Every day | ORAL | 0 refills | Status: DC
  Filled 2021-05-24: qty 30, 30d supply, fill #0

## 2021-05-24 MED ORDER — JUVEN PO PACK
1.0000 | PACK | Freq: Two times a day (BID) | ORAL | 0 refills | Status: DC
  Filled 2021-05-24: qty 60, 30d supply, fill #0

## 2021-05-24 MED ORDER — ZOLPIDEM TARTRATE 5 MG PO TABS
5.0000 mg | ORAL_TABLET | Freq: Every day | ORAL | 0 refills | Status: DC
  Filled 2021-05-24: qty 30, 30d supply, fill #0

## 2021-05-24 MED ORDER — ASCORBIC ACID 500 MG PO TABS
500.0000 mg | ORAL_TABLET | Freq: Two times a day (BID) | ORAL | 0 refills | Status: DC
  Filled 2021-05-24: qty 60, 30d supply, fill #0

## 2021-05-24 MED ORDER — BACLOFEN 10 MG PO TABS
40.0000 mg | ORAL_TABLET | Freq: Three times a day (TID) | ORAL | 0 refills | Status: DC
  Filled 2021-05-24: qty 180, 15d supply, fill #0

## 2021-05-24 MED ORDER — COLLAGENASE 250 UNIT/GM EX OINT
1.0000 "application " | TOPICAL_OINTMENT | Freq: Every day | CUTANEOUS | 0 refills | Status: DC
  Filled 2021-05-24: qty 30, 30d supply, fill #0

## 2021-05-24 MED ORDER — MUSCLE RUB 10-15 % EX CREA
1.0000 "application " | TOPICAL_CREAM | CUTANEOUS | 0 refills | Status: DC | PRN
  Filled 2021-05-24: qty 85, 20d supply, fill #0

## 2021-05-24 MED ORDER — GABAPENTIN 300 MG PO CAPS
900.0000 mg | ORAL_CAPSULE | Freq: Three times a day (TID) | ORAL | 0 refills | Status: DC
  Filled 2021-05-24: qty 180, 20d supply, fill #0

## 2021-05-24 MED ORDER — ATORVASTATIN CALCIUM 40 MG PO TABS
40.0000 mg | ORAL_TABLET | Freq: Every day | ORAL | 0 refills | Status: AC
  Filled 2021-05-24: qty 30, 30d supply, fill #0

## 2021-05-24 MED ORDER — TIZANIDINE HCL 2 MG PO TABS
2.0000 mg | ORAL_TABLET | Freq: Two times a day (BID) | ORAL | 0 refills | Status: DC
  Filled 2021-05-24: qty 60, 30d supply, fill #0

## 2021-05-24 MED ORDER — LISINOPRIL 5 MG PO TABS
5.0000 mg | ORAL_TABLET | Freq: Every day | ORAL | 0 refills | Status: DC
  Filled 2021-05-24: qty 30, 30d supply, fill #0

## 2021-05-24 MED ORDER — PSEUDOEPHEDRINE HCL 30 MG PO TABS
15.0000 mg | ORAL_TABLET | Freq: Two times a day (BID) | ORAL | 0 refills | Status: DC
  Filled 2021-05-24: qty 60, 60d supply, fill #0

## 2021-05-24 MED ORDER — ZINC SULFATE 220 (50 ZN) MG PO TABS
220.0000 mg | ORAL_TABLET | Freq: Every day | ORAL | 0 refills | Status: DC
  Filled 2021-05-24: qty 30, 30d supply, fill #0

## 2021-05-24 MED ORDER — MELATONIN 3 MG PO TABS
6.0000 mg | ORAL_TABLET | Freq: Every day | ORAL | 0 refills | Status: AC
  Filled 2021-05-24: qty 60, 30d supply, fill #0

## 2021-05-24 MED ORDER — POLYETHYLENE GLYCOL 3350 17 GM/SCOOP PO POWD
17.0000 g | Freq: Every day | ORAL | 0 refills | Status: DC
  Filled 2021-05-24: qty 510, 30d supply, fill #0

## 2021-05-24 MED ORDER — OXYCODONE HCL 5 MG PO TABS
5.0000 mg | ORAL_TABLET | Freq: Three times a day (TID) | ORAL | 0 refills | Status: DC | PRN
  Filled 2021-05-24: qty 90, 30d supply, fill #0

## 2021-05-24 MED ORDER — LIDOCAINE 5 % EX PTCH
1.0000 | MEDICATED_PATCH | CUTANEOUS | 0 refills | Status: DC
  Filled 2021-05-24: qty 30, 30d supply, fill #0

## 2021-05-24 MED ORDER — SENNOSIDES-DOCUSATE SODIUM 8.6-50 MG PO TABS
2.0000 | ORAL_TABLET | Freq: Every day | ORAL | 0 refills | Status: DC
  Filled 2021-05-24: qty 60, 30d supply, fill #0

## 2021-05-24 MED ORDER — GUAIFENESIN ER 600 MG PO TB12
1200.0000 mg | ORAL_TABLET | Freq: Two times a day (BID) | ORAL | 1 refills | Status: DC
  Filled 2021-05-24: qty 40, 10d supply, fill #0

## 2021-05-24 NOTE — Progress Notes (Signed)
Occupational Therapy Session Note  Patient Details  Name: Luke Wright MRN: [id number] Date of Birth: 1971-04-08  Today's Date: 05/24/2021 OT Individual Time: 1100-1200 OT Individual Time Calculation (min): 60 min    Short Term Goals: Week 4:  OT Short Term Goal 1 (Week 4): Pt will self feed with AE PRN with Max A OT Short Term Goal 2 (Week 4): Pt will brush teeth with AE with Max A OT Short Term Goal 3 (Week 4): Pt will brush teeth with AE with Max A  Skilled Therapeutic Interventions/Progress Updates:    OT intervention with focus on bed mobility, OOB tolerance, ongoing educaiton, BUE PROM, and directing care. BP supine with Ted hose, ACE wraps, and abdominal binder at supine-103/61. Rolling R/L to place sling for MaxiLift with tot A+2. Transfer to [facility] with MaxiLift. BP 79/52 pt asymptomatic. Reclined BP 95/58. Upright BP 85/54 and pt asymptomatic. Pt remained in semi reclined position in PWC-BP 103/60. All needs within reach. Pt able to activate soft call bell with head. O2 sats>90% 8L FiO2 40%.   Therapy Documentation Precautions:  Precautions Precautions: Fall Precaution Comments: C5 SCI, Asia A, trach Restrictions Weight Bearing Restrictions: No Pain:  Pt denies pain this morning.    Therapy/Group: Individual Therapy  Rich Brave 05/24/2021, 2:47 PM

## 2021-05-24 NOTE — Progress Notes (Signed)
Patient ID: Luke Wright, male   DOB: 10/08/1970, 50 y.o.   MRN: [id number]  SW ordered [employer] vest and home o2 with [employer] via parachute.   SW spoke with pt dtr [name] who reports she is comfortable with performing wound care to her father. She also reports that [name] has been able to provide additional assistance for T/W as well. Pt is aware on incontinence supplies ordered.   SW spoke with [name]/Maxim who reported she will need wound care instructions. Reports the following schedule for private duty nursing assigned at this time for the month of November:  Monday 8:30am-5pm Tuesday and Wednesday 9:30am-5pm Thursday 8:30am-5pm Friday 11pm-7pm *every other weekend 8:30am-5pm/11pm-7pm beginning on 11/19  SW faxed order for briefs and underpads to [employer].   [name], MSW, LCSWA Office: [phone] Cell: [phone] Fax: [phone]

## 2021-05-24 NOTE — Telephone Encounter (Signed)
[facility] called to get clinical information on patient for PA for Roxicodone and Lidocaine patches. Patient is currently admitted in the hospital.
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
## 2021-05-24 NOTE — Progress Notes (Signed)
Patient resting in bed. Patients o2 has been off since 15:30. Remains on Dinamap continuous observation for o2. O2 alarm has not alarmed, low o2 warning set at 88%. Denies pain or discomfort. Suppository administered per order PM bowel program.Soft pad call light within reach, phone in visual area, bed in low position. Safety maintained.

## 2021-05-24 NOTE — Progress Notes (Signed)
Physical Therapy Wound Treatment Patient Details  Name: Luke Wright MRN: [id number] Date of Birth: 1971/07/08  Today's Date: 05/24/2021 PT Individual Time: 1015-1055 PT Individual Time Calculation (min): 40 min   Subjective  Subjective: Pt pleasant and agreeable to hydrotherapy. Patient and Family Stated Goals: None stated Date of Onset:  (Unknown) Prior Treatments: Dressing changes; Santyl  Pain Score: Pain Score: 2   Wound Assessment  Pressure Injury 04/23/21 Buttocks Left Unstageable - Full thickness tissue loss in which the base of the injury is covered by slough (yellow, tan, gray, green or brown) and/or eschar (tan, brown or black) in the wound bed. Unstageable to left buttock wit (Active)  Wound Image   05/23/21 1132  Dressing Type Barrier Film (skin prep);Foam - Lift dressing to assess site every shift;Gauze (Comment);Moist to dry;Normal saline moist dressing;Santyl 05/24/21 1110  Dressing Changed;Clean;Dry;Intact 05/24/21 1110  Dressing Change Frequency Daily 05/24/21 1110  State of Healing Early/partial granulation 05/24/21 1110  Site / Wound Assessment Clean;Red;Yellow;Brown 05/24/21 1110  % Wound base Red or Granulating 60% 05/24/21 1110  % Wound base Yellow/Fibrinous Exudate 40% 05/24/21 1110  % Wound base Black/Eschar 0% 05/24/21 1110  % Wound base Other/Granulation Tissue (Comment) 0% 05/24/21 1110  Peri-wound Assessment Intact 05/23/21 1132  Wound Length (cm) 10 cm 05/23/21 1132  Wound Width (cm) 4 cm 05/23/21 1132  Wound Depth (cm) 3.5 cm 05/16/21 1100  Wound Surface Area (cm^2) 40 cm^2 05/23/21 1132  Wound Volume (cm^3) 140 cm^3 05/16/21 1100  Tunneling (cm) 0 05/09/21 1148  Undermining (cm) 11-12*   3 cm 05/23/21 1132  Margins Unattached edges (unapproximated) 05/24/21 1110  Drainage Amount Moderate 05/24/21 1110  Drainage Description Serous;Serosanguineous 05/24/21 1110  Treatment Cleansed;Debridement (Selective);Hydrotherapy (Pulse lavage);Packing  (Saline gauze) 05/24/21 1110      Hydrotherapy Pulsed lavage therapy - wound location: L buttock Pulsed Lavage with Suction (psi): 12 psi (4 over healthy red) Pulsed Lavage with Suction - Normal Saline Used: 1000 mL Pulsed Lavage Tip: Tip with splash shield Selective Debridement Selective Debridement - Location: L buttock Selective Debridement - Tools Used: Forceps;Scalpel Selective Debridement - Tissue Removed: Yellow unviable tissue   Wound Assessment and Plan  Wound Therapy - Assess/Plan/Recommendations Wound Therapy - Clinical Statement: Progressing with debridement mainly in the superior wound bed. He will benefit from continued hydrotherapy for selective removal of unviable tissue, to decrease bioburden, and promote wound bed healing. Wound Therapy - Functional Problem List: SCI - decreased tolerance for position changes; immobility Factors Delaying/Impairing Wound Healing: Immobility;Altered sensation Hydrotherapy Plan: Debridement;Patient/family education;Dressing change;Pulsatile lavage with suction Wound Therapy - Frequency: 6X / week Wound Therapy - Current Recommendations: PT Wound Therapy - Follow Up Recommendations: dressing changes by RN;dressing changes by family/patient  Wound Therapy Goals- Improve the function of patient's integumentary system by progressing the wound(s) through the phases of wound healing (inflammation - proliferation - remodeling) by: Decrease Necrotic Tissue to: 20% Decrease Necrotic Tissue - Progress: Progressing toward goal Increase Granulation Tissue to: 80% Increase Granulation Tissue - Progress: Progressing toward goal Improve Drainage Characteristics: Min;Serous Improve Drainage Characteristics - Progress: Progressing toward goal Goals/treatment plan/discharge plan were made with and agreed upon by patient/family: Yes Time For Goal Achievement: 7 days Wound Therapy - Potential for Goals: Good  Goals will be updated until maximal  potential achieved or discharge criteria met.  Discharge criteria: when goals achieved, discharge from hospital, MD decision/surgical intervention, no progress towards goals, refusal/missing three consecutive treatments without notification or medical reason.  GP  Tessie Fass Leandro Berkowitz 05/24/2021, 11:13 AM 05/24/2021  Ginger Carne., PT Acute Rehabilitation Services [phone]  (pager) [phone]  (office)

## 2021-05-24 NOTE — Progress Notes (Signed)
Physical Therapy Session Note  Patient Details  Name: Luke Wright MRN: [id number] Date of Birth: March 20, 1971  Today's Date: 05/24/2021 PT Individual Time: 0805-0915 PT Individual Time Calculation (min): 70 min   Short Term Goals: Week 4:  PT Short Term Goal 1 (Week 4): =LTG due to ELOS  Skilled Therapeutic Interventions/Progress Updates: Pt presented in bed agreeable to therapy. Pt denies pain with intermittent sensitivity in BUE when rolling. Pt BP prior to 102/62 taken by nursing. PTA and SPTA doffed PRAO's and donned TED hose and ace bandages. PTA performed stretching/ROM to BUE prior to rolling pt to don abdominal binder. BP checked once completed 100/60 (72). Pt then transferred to sitting EOB total A x 2 and pt able to sit EOB with support from PTA and SPTA ~5 min. Pt appreciative for change in position. BP checked immediately to sitting up 93/60 (70) then prior to return to supine 79/55 with pt asymptomatic. Once in supine pt positioned to comfort and left with soft touch bell within reach, and current needs met.      Therapy Documentation Precautions:  Precautions Precautions: Fall Precaution Comments: C5 SCI, [place] A, trach Restrictions Weight Bearing Restrictions: No General:   Vital Signs: Therapy Vitals Temp: 98.2 F (36.8 C) Temp Source: Oral Pulse Rate: 94 Resp: 18 BP: (!) 134/52 Patient Position (if appropriate): Lying Oxygen Therapy SpO2: 92 % O2 Device: Room Air Pain:   Mobility:   Locomotion :    Trunk/Postural Assessment :    Balance:   Exercises:   Other Treatments:      Therapy/Group: Individual Therapy  Emonnie Cannady 05/24/2021, 4:30 PM

## 2021-05-24 NOTE — Progress Notes (Addendum)
Occupational Therapy Session Note  Patient Details  Name: Luke Wright MRN: [id number] Date of Birth: [birth year]  Today's Date: 05/24/2021 OT Individual Time: 1400-1440 OT Individual Time Calculation (min): 40 min    Short Term Goals: Week 4:  OT Short Term Goal 1 (Week 4): Pt will self feed with AE PRN with Max A OT Short Term Goal 2 (Week 4): Pt will brush teeth with AE with Max A OT Short Term Goal 3 (Week 4): Pt will brush teeth with AE with Max A  Skilled Therapeutic Interventions/Progress Updates:    Pt resting in PWC upon arrival. O2 removed to rest O2 sats on RA. O2 95% on 8L FiO2 40%. O2 removed and pt O2 sats decreased to 86% at rest after 12'18". BP 95/63 seated in PWC. Pt transferred back to bed with MaxiLift. Tot A+2 for positioning in bed. All needs within reach. Pt able to activate soft call bell with head.   Therapy Documentation Precautions:  Precautions Precautions: Fall Precaution Comments: C5 SCI, Asia A, trach Restrictions Weight Bearing Restrictions: No Pain:  Pt denies pain this afternoon   Therapy/Group: Individual Therapy  Rich Brave 05/24/2021, 2:54 PM

## 2021-05-25 ENCOUNTER — Other Ambulatory Visit (HOSPITAL_COMMUNITY): Payer: Self-pay

## 2021-05-25 ENCOUNTER — Telehealth: Payer: Self-pay

## 2021-05-25 DIAGNOSIS — N319 Neuromuscular dysfunction of bladder, unspecified: Secondary | ICD-10-CM

## 2021-05-25 DIAGNOSIS — G8250 Quadriplegia, unspecified: Secondary | ICD-10-CM | POA: Diagnosis not present

## 2021-05-25 MED ORDER — LIDOCAINE 5 % EX PTCH
1.0000 | MEDICATED_PATCH | CUTANEOUS | 0 refills | Status: DC
  Filled 2021-05-25: qty 30, 30d supply, fill #0

## 2021-05-25 NOTE — Progress Notes (Signed)
Bowel program completed. Pt had 1 large BM, Type 6., dig stim done, no further BM.peri care is done. Pt tolerated well.Pt resting in bed. Pt's oxygen level fluctuating between 90%-94% on RA .
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
## 2021-05-25 NOTE — Progress Notes (Signed)
Inpatient Rehabilitation Care Coordinator Discharge Note   Patient Details  Name: Luke Wright MRN: [id number] Date of Birth: [birth year]   Discharge location: D/c to home with his daughter who will provide 24/7 care.  Length of Stay: 31 days  Discharge activity level: Dependent  Home/community participation: Limited  Patient response DZ:HGDJME Literacy - How often do you need to have someone help you when you read instructions, pamphlets, or other written material from your doctor or pharmacy?: Never  Patient response QA:STMHDQ Isolation - How often do you feel lonely or isolated from those around you?: Never  Services provided included: RD, MD, PT, OT, SLP, RN, CM, TR, Pharmacy, Neuropsych, SW  Financial Services:  Financial Services Utilized: Medicaid [facility] Medicare  Choices offered to/list presented to: Yes  Follow-up services arranged:  Other (Comment), DME (Private Duty Nursing with [facility] to address wound care and trach care needs.)      DME : [employer] for hospital bed, hoyer lift with sling, trach care kits and supplies, portable suction and oxygen. [facility] [employer] for loaner w/c.   Patient response to transportation need: Is the patient able to respond to transportation needs?: No In the past 12 months, has lack of transportation kept you from medical appointments or from getting medications?: No In the past 12 months, has lack of transportation kept you from meetings, work, or from getting things needed for daily living?: No   Comments (or additional information): Referral submitted for CAP/DA services.   Patient/Family verbalized understanding of follow-up arrangements:  Yes  Individual responsible for coordination of the follow-up plan: contact pt dtr [name] [phone]  Confirmed correct DME delivered: Gretchen Short 05/25/2021    Gretchen Short

## 2021-05-25 NOTE — Progress Notes (Signed)
Patient ID: Luke Wright, male   DOB: 1970/08/01, 50 y.o.   MRN: [id number]  SW sent [facility] [employer] referral.   SW met with pt in room to inform on above.   Pt will have home o2 delivered to the home today by [place].   Loralee Pacas, MSW, [place] Office: [phone] Cell: [phone] Fax: [phone]

## 2021-05-25 NOTE — Telephone Encounter (Signed)
PA for Rx Oxycodone 5 MG Tab approved form [id number] from 05/24/2021 to 06/23/2021. Pharmacy informed, fax sent for scanning.

## 2021-05-25 NOTE — Progress Notes (Signed)
Notes discussed patient with OT, RN and LCSW.   He desaturated to 86% on RA with approx 13 minutes of therapy with OT .   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  He requires 8L oxygen via ATC continuously to maintain oxygen saturation over 88%

## 2021-05-25 NOTE — Progress Notes (Signed)
Patient being  D/C home today. Foley cath changed per order. Patient tolerated well. Clear yellow urine noted. Honeycomb dressing removed, cleansed with soap and water, pat dry and applied dry dressing. Patient denied pain. SPO2 95% on room air while writer in patients room. Patient has no questions at this time regarding being D/C home to daughter. Belongings packed up, family to pick up patients belongings after work.

## 2021-05-25 NOTE — Discharge Summary (Signed)
Physician Discharge Summary  Patient ID: Luke Wright MRN: [id number] DOB/AGE: September 09, 1970 50 y.o.  Admit date: 04/23/2021 Discharge date: 05/25/2021  Discharge Diagnoses:  Principal Problem:   Quadriplegia, unspecified ([place]) Active Problems:   Pressure injury of sacral region, unstageable ([place])   Hypoalbuminemia due to protein-calorie malnutrition (HCC)   Neurogenic bowel   Acute blood loss anemia   Essential hypertension   Dysphagia   Tracheostomy care (HCC)   Chronic pain syndrome   Neuropathy   Discharged Condition: good  Significant Diagnostic Studies: DG Thoracic Spine 1 View  Result Date: 05/15/2021 CLINICAL DATA:  Baclofen pump insertion. EXAM: OPERATIVE THORACIC SPINE 1 VIEW(S) COMPARISON:  None. FINDINGS: Single lateral view of the upper thoracic region shows previous posterior fusion, details limited. No definite localizing marker identified. IMPRESSION: Lateral view provided for operative guidance. Electronically Signed   By: Nelson Chimes M.D.   On: 05/15/2021 10:16   IR Fluoro Guide Ndl Plmt / BX  Result Date: 05/08/2021 CLINICAL DATA:  50 year old male with a history of spastic quadriplegia. He presents for fluoroscopic guided lumbar puncture to obtain access to the thecal space prior to instillation of intrathecal baclofen by neuro surgery. EXAM: INTRATHECAL INJECTION OF BACLOFEN PROCEDURE: Informed consent was obtained from the patient prior to the procedure, including potential complications of headache, allergy, and pain. With the patient prone, the lumbar area of the back was prepped with Betadine. 1% Lidocaine was used for local anesthesia. Lumbar puncture was performed at the L3-L4 level using a 20 gauge needle with return of clearCSF. 100 mcg of baclofen was injected into the subarachnoid space by the neurosurgery service. The patient tolerated the procedure well without apparent complication. FLUOROSCOPY TIME:  0 minutes 18 seconds 31 mGy IMPRESSION:  Successful L3-L4 lumbar puncture for intrathecal administration of baclofen. Electronically Signed   By: Jacqulynn Cadet M.D.   On: 05/08/2021 14:18   DG CHEST PORT 1 VIEW  Result Date: 05/20/2021 CLINICAL DATA:  Cough.  Recent surgery EXAM: PORTABLE CHEST 1 VIEW COMPARISON:  04/23/2021 FINDINGS: Mild elevation right hemidiaphragm with mild right lower lobe atelectasis. Interval clearing of left lower lobe airspace disease from the prior study. Negative for heart failure or edema.  No effusion. Tracheostomy in good position.  No pneumothorax Posterior hardware fusion in the cervical and thoracic spine. IMPRESSION: Mild right lower lobe atelectasis. Interval clearing of left lower lobe airspace disease. Electronically Signed   By: Franchot Gallo M.D.   On: 05/20/2021 12:42   DG C-Arm 1-60 Min-No Report  Result Date: 05/15/2021 Fluoroscopy was utilized by the requesting physician.  No radiographic interpretation.    Labs:  Basic Metabolic Panel: BMP Latest Ref Rng & Units 05/23/2021 05/21/2021 05/14/2021  Glucose 70 - 99 mg/dL 100(H) 110(H) 101(H)  BUN 6 - 20 mg/dL _0 Creatinine 0.61 - 1.24 mg/dL 0.54(L) 0.58(L) 0.51(L)  Sodium 135 - 145 mmol/L 133(L) 134(L) 134(L)  Potassium 3.5 - 5.1 mmol/L 4.0 3.6 3.9  Chloride 98 - 111 mmol/L 100 100 100  CO2 22 - 32 mmol/L _1 Calcium 8.9 - 10.3 mg/dL 9.1 9.0 9.2     CBC: CBC Latest Ref Rng & Units 05/23/2021 05/21/2021 05/14/2021  WBC 4.0 - 10.5 K/uL 9.8 11.9(H) 10.8(H)  Hemoglobin 13.0 - 17.0 g/dL 10.3(L) 10.1(L) 10.7(L)  Hematocrit 39.0 - 52.0 % 32.4(L) 32.1(L) 34.4(L)  Platelets 150 - 400 K/uL 335 323 307     CBG: No results for input(s): GLUCAP in the last 168  hours.  Brief HPI:   Luke Wright is a 50 y.o. male with history of stroke 2012 with mild right-sided weakness/29/22 with L-C3 and R-C4 fractures, C5 burst fractures, 3 months-do compression fracture with moderate canal stenosis and manubrial fracture.  He reportedly  landed on his back, had a lack of sensation from nipple line down and was unable to move BLE and BUE.  He underwent C2-T1 fixation and required PEG/trach placement on 07/063.  Hospital course significant for problems with PEG dislocation patient with pneumoperitoneum requiring diagnostic laparoscopy which showed PEG tube in proper placement.  He developed ileus requiring colonoscopy for decompression Endoscopy done revealed that G-tube bumper was buried in gastric wall therefore PEG was removed on 02/23/2021.    He was weaned off vent and decannulated by 08/20 but had difficulty clearing secretions. He was transferred back to ICU and trach replacement on 08/26.  He was extubated to ATC and respiratory status was stable however he was requiring supplemental oxygen up to 8 L with activity.  Pseudoephedrine was added to manage bradycardia and was being weaned slowly.  He was started on bowel program to manage neurogenic bowel and was requiring INO caths for neurogenic bladder.  SCI rehab was ongoing and he was showing some improvement in activity with ability to support himself when propped as well as ability to bring hands to his mouth.  Patient with resultant C4 motor/sensory ASIA A SCI and CIR was recommended due to functional deficits.     Hospital Course: [facility] was admitted to rehab 04/23/2021 for inpatient therapies to consist of PT, ST and OT at least three hours five days a week. Past admission physiatrist, therapy team and rehab RN have worked together to provide customized collaborative inpatient rehab.  Bilateral lower extremity Dopplers done after admission were negative for DVT.  He was maintained on subcu Lovenox for DVT prophylaxis during hospitalization and plavix was resumed due to history of CVA.  Air mattress overlay was used for pressure relief measures secondary to sacral decub.  Patient was noted to have cuffed size #8 trach at admission and PCCM was consulted for input on  downsizing of trach.  He was also noted to have thick yellow secretions and Dr. Elsworth Soho recommended starting patient on IV cefepime due to concerns of tracheobronchitis as well as checking sputum culture and fever curve. Sputum culture was positive for Enterobacter and beta lactamase positive Moraxella and he was treated with 14 day course of antibiotic regimen.   He has been downsized to CFS# 6 and respiratory status is stable on 8 L oxygen via ATC as he has tendency to desaturate to 87% on RA with minimal activity. CXR done at admission and follow up repeated on 11/06 showing clearing of LLL airspace disease. Protein supplements as well as multivitamins were also added to promote healing and WOC was consulted for input due to new minimal results with use of Santyl and damp to dry dressing.  Hydrotherapy was initiated and has been used during his hospitalization with improvement in wound bed.  Family advised to resume Santyl with damp to dry dressing at discharge.  His blood pressures were monitored on TID basis and he has required TEDs and Abdominal binder when up to prevent orthostatic drop in BP.    He was noted to have significant flexor tone and contractures of BUE therefore tizanidine was added in addition to 200 mg Botox to bilateral biceps and brachialis muscles.  Due to significant tone and  concerns of worsening due to SCI, neurosurgery and IVR were consulted for ITB trial which showed improvement in pain control. He underwent insertion of intrathecal baclofen pump (Medtronic 40 cc device)  on 11/01 by Dr. Reatha Armour. ITB gradually titrated to 146.7 mcg/day with refill date 10/11/21.  He has had insomnia resistant to trazodone or Remeron trials therefore was transitioned over to Ambien with good results.  Leukocytosis has resolved . Serial check of electrolytes showed transient hypokalemia has resolved with supplement replacement advanced to regular diet which he is tolerating without any signs or symptoms of  aspiration. He was started on bowel program to manage neurogenic bowel with senna in am  followed by suppository after supper daily.  He has been refusing MiraLAX and daughter was advised to use this on as needed basis.    He has required I/O caths due to neurogenic bladder but this was changed to indwelling foley to help with ease of care at home/family request. Foley was changed out on day of discharge and family advised on foley care BID and prn.  His respiratory status has improved he was able to tolerate PMSV during all waking hours.  He requires oxycodone prn for BUE/shoulder pain.  Baclofen pump has been gradually titrated to 146.7 mcg/day with refill date 10/12/2022.  Most recent check of electrolytes shows hyponatremia with sodium at 133 and renal status is stable.  He has made gains during his stay and is able to direct caregivers on care needed. [facility] will provide private duty nursing Monday and Thursday 8:30 AM to 5 PM, Tuesday and Wednesday -9:30 AM to 5 PM and every other weekend alternating from 8:30 AM to 5 PM to  11 PM to 7 PM beginning 11/19.  He was discharged on 11/11 having met maximal gain in CIR.    Rehab course: During patient's stay in rehab weekly team conferences were held to monitor patient's progress, set goals and discuss barriers to discharge. At admission, patient required total assist with basic ADL task and with mobility. He had copious secretions with low O2 saturations Dr. PMSV was used with.  Supervision initially.  He was maintained on dysphagia 3 diet with PMSV for meals.  Cognitive function appeared to be [facility]. He  has had improvement in activity tolerance, balance and postural control as well as ability to compensate for deficits. He requires max to total assist for ADL tasks and mod assist for self-feeding with use of adaptive equipment.  He is dependent for transfers.  He is able to use power wheelchair at modified independent level and is able to direct  family and caregivers for transfers and for pressure-relief measures.  He is tolerating regular diet without s/s of aspiration and is able to direct his care regarding bolus size, rate of eating as well as directing care givers to don/doff PMSV therefore ST signed off on 10/27.  Family conference was held regarding care needed and family education has been completed with daughter who will provide 24/7 care  Disposition:  Home  Discharge disposition: 01-Home or Self Care  Diet: Regualr  Special Instructions: Continue bowel program as advised.  Perform Foley care twice daily. Santyl with moist to dry dressing changes to sacrum twice daily. Boost every 20 minutes when in chair and turn every 2 hours when in bed.   Discharge Instructions     Ambulatory referral to Physical Medicine Rehab   Complete by: As directed    May need video visit--patient is a quad  Trach visit   Complete by: Jun 08, 2021    Please call patient's daughter with appointment information      Allergies as of 05/25/2021   No Known Allergies      Medication List     STOP taking these medications    acetaminophen 500 MG tablet Commonly known as: TYLENOL   albuterol (2.5 MG/3ML) 0.083% nebulizer solution Commonly known as: PROVENTIL   BENGAY EX   calcium carbonate 500 MG chewable tablet Commonly known as: TUMS - dosed in mg elemental calcium   dextrose 50 % solution   docusate sodium 100 MG capsule Commonly known as: COLACE   enoxaparin 30 MG/0.3ML injection Commonly known as: LOVENOX   GLUCAGON HCL IJ   hydrALAZINE 20 MG/ML injection Commonly known as: APRESOLINE   hydroxypropyl methylcellulose / hypromellose 2.5 % ophthalmic solution Commonly known as: ISOPTO TEARS / GONIOVISC   labetalol HCl 20 MG/4ML injection Commonly known as: NORMODYNE   lidocaine 2 % solution Commonly known as: XYLOCAINE   magnesium oxide 400 MG tablet Commonly known as: MAG-OX   ondansetron in dextrose  solution   phenol 1.4 % Liqd Commonly known as: CHLORASEPTIC   polyethylene glycol 17 g packet Commonly known as: MIRALAX / GLYCOLAX Replaced by: polyethylene glycol powder 17 GM/SCOOP powder   potassium chloride SA 20 MEQ tablet Commonly known as: KLOR-CON   senna 8.6 MG Tabs tablet Commonly known as: SENOKOT   traZODone 50 MG tablet Commonly known as: DESYREL       TAKE these medications    amitriptyline 25 MG tablet Commonly known as: ELAVIL Take 1 tablet (25 mg total) by mouth at bedtime.   atorvastatin 40 MG tablet Commonly known as: LIPITOR Take 1 tablet (40 mg total) by mouth daily after supper. What changed:  medication strength how much to take when to take this   baclofen 10 MG tablet Commonly known as: LIORESAL Take 4 tablets (40 mg total) by mouth 3 (three) times daily. What changed:  medication strength when to take this   bisacodyl 10 MG suppository Commonly known as: DULCOLAX Place 1 suppository (10 mg total) rectally daily. Notes to patient: purchse over the counter.    gabapentin 300 MG capsule Commonly known as: NEURONTIN Take 3 capsules (900 mg total) by mouth 3 (three) times daily. Notes to patient: Administer 30-45 minutes after supper.    lidocaine 2 % jelly Commonly known as: XYLOCAINE Apply 1 application topically as needed (mild nare pain).   lidocaine 5 % Commonly known as: LIDODERM Place 1 patch onto the shoulder at 7 pm and remove at 7 am daily. What changed:  how much to take additional instructions   lisinopril 5 MG tablet Commonly known as: ZESTRIL Take 1 tablet (5 mg total) by mouth daily.   melatonin 3 MG Tabs tablet Take 2 tablets (6 mg total) by mouth at bedtime.   Muscle Rub 10-15 % Crea Apply 1 application topically as needed for muscle pain. Notes to patient: Purchase over the counter   nutrition supplement (JUVEN) Pack Take 1 packet by mouth 2 (two) times daily between meals.   oxyCODONE 5 MG immediate  release tablet--90 pills  Commonly known as: Oxy IR/ROXICODONE Take 1 tablet (5 mg total) by mouth 3 (three) times daily as needed for severe pain.   polyethylene glycol powder 17 GM/SCOOP powder Commonly known as: GLYCOLAX/MIRALAX Take 17 g by mouth daily. Replaces: polyethylene glycol 17 g packet Notes to patient: Available over the counter  pseudoephedrine 30 MG tablet Commonly known as: SUDAFED Take 0.5 tablets (15 mg total) by mouth in the morning and at bedtime.   Santyl ointment Generic drug: collagenase Apply 1 application topically daily. To yellow slough on left buttock then cover with damp to dry dressing What changed: additional instructions   Senexon-S 8.6-50 MG tablet Generic drug: senna-docusate Take 2 tablets by mouth daily at 6 (six) AM. Notes to patient: avialable over the counter   SM Mucus Relief 600 MG 12 hr tablet Generic drug: guaiFENesin Take 2 tablets (1,200 mg total) by mouth 2 (two) times daily.   tiZANidine 2 MG tablet Commonly known as: ZANAFLEX Take 1 tablet (2 mg total) by mouth 2 (two) times daily.   vitamin C 500 MG tablet Commonly known as: ASCORBIC ACID Take 1 tablet (500 mg total) by mouth 2 (two) times daily.   Zinc Sulfate 220 (50 Zn) MG Tabs Take 1 tablet (220 mg total) by mouth daily.   zolpidem 5 MG tablet Commonly known as: AMBIEN Take 1 tablet (5 mg total) by mouth at bedtime.               Durable Medical Equipment  (From admission, onward)           Start     Ordered   05/25/21 0954  For home use only DME oxygen  Once       Comments: Patient has C3, C4 and C5 injury and is C5 quad who is trach dependent. He desaturates to 86% on RA with minimal activity and will have lifetime oxygen needs. He requires 8 L oxygen continuously via ATC.  Question Answer Comment  Length of Need Lifetime   Oxygen delivery system Other see comments      05/25/21 0955            Follow-up Information     Lovorn, Jinny Blossom, MD  Follow up.   Specialty: Physical Medicine and Rehabilitation Why: office will call you with follow up appointment Contact information: [address] [place] [address] [phone]         Dawley, Pieter Partridge C, DO Follow up.   Contact information: [address] [place] [address] [phone]         Bjorn Loser, MD Follow up on 06/18/2021.   Specialty: Urology Why: Appointment.(for set up with urology) at 10:30 am Contact information: [place] [address] [phone]                 Signed: Bary Leriche 05/25/2021, 5:04 PM

## 2021-05-25 NOTE — Progress Notes (Signed)
Occupational Therapy Discharge Summary  Patient Details  Name: Luke Wright MRN: [id number] Date of Birth: 10/03/1970  Patient has met 1 of 1 long term goals due to  completion of all SCI pt and family education and addressing all DME needs .  Pt independent with directing care for transfers with [facility] and self care needs. Pt verbalized pressure relief schedule and verbalized understanding of importance of pressure relief. Pt dependent for self care including feeding. Pt's daughter has been present and particiapted in therapy sessions and dependent transfers with lift.Patient to discharge at overall Total Assist level.  Patient's care partner is independent to provide the necessary physical assistance at discharge.     Recommendation:  Patient will benefit from ongoing skilled OT services in home health setting to continue to advance functional skills in the area of BADL and Reduce care partner burden.  Equipment: Hospital bed, hoyer  Reasons for discharge: treatment goals met and discharge from hospital  Patient/family agrees with progress made and goals achieved: Yes  OT Discharge Vision Baseline Vision/History: 1 Wears glasses Patient Visual Report: No change from baseline Vision Assessment?: No apparent visual deficits Perception  Perception: Impaired Praxis Praxis: Impaired Cognition Overall Cognitive Status: Within Functional Limits for tasks assessed Arousal/Alertness: Awake/alert Orientation Level: Oriented X4 Year: 2022 Month: October Day of Week: Correct Attention: Focused;Sustained Focused Attention: Appears intact Sustained Attention: Appears intact Memory: Appears intact Immediate Memory Recall: Sock;Blue;Bed Memory Recall Sock: Without Cue Memory Recall Blue: Without Cue Memory Recall Bed: Without Cue Awareness: Appears intact Problem Solving: Appears intact Safety/Judgment: Appears intact Sensation Sensation Light Touch: Impaired by gross  assessment Light Touch Impaired Details: Impaired RUE;Impaired LUE;Impaired RLE;Impaired LLE Hot/Cold: Impaired by gross assessment Proprioception: Impaired by gross assessment Proprioception Impaired Details: Impaired RUE;Impaired LUE;Absent RLE;Absent LLE Additional Comments: 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 Coordination Gross Motor Movements are Fluid and Coordinated: No Fine Motor Movements are Fluid and Coordinated: No Coordination and Movement Description: severely limited 2/2 complete C5 SCI Finger Nose Finger Test: unable Motor  Motor Motor: Tetraplegia Trunk/Postural Assessment  Cervical Assessment Cervical Assessment: Exceptions to [facility] (limited by trach collar) Thoracic Assessment Thoracic Assessment: Exceptions to [facility] (rounded shoulders) Lumbar Assessment Lumbar Assessment: Exceptions to [facility] Postural Control Trunk Control: total A for sitting balance Righting Reactions: impaired/absent Postural Limitations: impaired  Balance Static Sitting Balance Static Sitting - Balance Support: Feet supported Static Sitting - Level of Assistance: 1: +1 Total assist Dynamic Sitting Balance Dynamic Sitting - Balance Support: Feet supported;During functional activity;No upper extremity supported Dynamic Sitting - Level of Assistance: 1: +2 Total assist Extremity/Trunk Assessment RUE Assessment Passive Range of Motion (PROM) Comments: severely limited 2/2 tone and elbow contractures Active Range of Motion (AROM) Comments: able to perform shoulder elevation and some retraction, some bicep activation and trace wrist flex/ext, no AROM in digits RUE Tone RUE Tone: Modified Ashworth Body Part - Modified Ashworth Scale: Elbow Elbow - Modified Ashworth Scale for Grading Hypertonia RUE: Affected part(s) rigid in flexion or extension Modified Ashworth Scale for Grading Hypertonia RUE: Affected part(s) rigid in flexion or extension LUE Assessment LUE Assessment: Exceptions to  [facility] Passive Range of Motion (PROM) Comments: severly limited 2/2 tone and elbow contractures, but L contractures better than R Active Range of Motion (AROM) Comments: able to perform shoulder elevation and some retraction, some bicep activation, no AROM in digits and wrist LUE Tone LUE Tone: Modified Ashworth Body Part - Modified Ashworth Scale: Elbow Elbow - Modified Ashworth Scale for Grading Hypertonia LUE:  Considerable increase in muschle tone, passive movement difficult Modified Ashworth Scale for Grading Hypertonia LUE: Considerable increase in muschle tone, passive movement difficult   [name] 05/25/2021, 6:46 AM

## 2021-05-25 NOTE — Telephone Encounter (Signed)
PA for Lidocaine patches 5% were denied by [facility]. Delle Reining, PA did the PA in the hospital

## 2021-05-25 NOTE — Progress Notes (Signed)
Physical Therapy Discharge Summary  Patient Details  Name: Luke Wright MRN: [id number] Date of Birth: July 25, 1970  Today's Date: 05/25/2021  Patient has met 4 of 6 long term goals due to improved activity tolerance and ability to direct care appropriately to caregivers.  Patient to discharge at  total A for bed mobility and supervision at w/c level  level Total Assist.   Patient's care partner is independent to provide the necessary physical assistance at discharge. Pt's daughter has completed hands-on training and is safe to assist pt upon d/c home.  Reasons goals not met: Pt continues to be supervision at [facility] level for mobility nearing mod I. Pt was unable to participate in w/c mobility several days in last week of therapy due to soft BP.   Recommendation:  Patient will benefit from ongoing skilled PT services in home health setting to continue to advance safe functional mobility, address ongoing impairments in endurance, strength, ROM, patient and family independence with transfers and functional mobility, balance, and minimize fall risk.  Equipment: Hospital bed, manual hoyer and XL sling, loaner power wheelchair from [employer]  Reasons for discharge: treatment goals met and discharge from hospital  Patient/family agrees with progress made and goals achieved: Yes  PT Discharge Precautions/Restrictions Precautions Precautions: Fall Precaution Comments: C5 SCI, [place] A, trach Restrictions Weight Bearing Restrictions: No Pain Interference Pain Interference Pain Effect on Sleep: 1. Rarely or not at all Pain Interference with Therapy Activities: 1. Rarely or not at all Pain Interference with Day-to-Day Activities: 1. Rarely or not at all Vision/Perception  Perception Perception: Impaired Praxis Praxis: Impaired  Cognition Overall Cognitive Status: Within Functional Limits for tasks assessed Arousal/Alertness: Awake/alert Orientation Level: Oriented X4 Attention:  Focused;Sustained Focused Attention: Appears intact Sustained Attention: Appears intact Sensation Sensation Light Touch: Impaired by gross assessment Light Touch Impaired Details: Impaired RUE;Impaired LUE;Impaired RLE;Impaired LLE Hot/Cold: Impaired by gross assessment Proprioception: Impaired by gross assessment Proprioception Impaired Details: Impaired RUE;Impaired LUE;Absent RLE;Absent LLE Additional Comments: 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 Coordination Gross Motor Movements are Fluid and Coordinated: No Fine Motor Movements are Fluid and Coordinated: No Coordination and Movement Description: severely limited 2/2 complete C5 SCI Finger Nose Finger Test: unable Heel Shin Test: unable Motor  Motor Motor: Tetraplegia Motor - Skilled Clinical Observations: severely limited 2/2 complete C5 SCI  Mobility Bed Mobility Bed Mobility: Rolling Right;Right Sidelying to Sit;Supine to Sit Rolling Right: 2 Helpers Rolling Left: 2 Helpers Right Sidelying to Sit: 2 Helpers Supine to Sit: 2 Helpers Sit to Supine: 2 Helpers Transfers Transfers: Transfer via Stage manager: Electronics engineer: No Gait Gait: No Stairs / Additional Locomotion Stairs: No Pick up small object from the floor (from standing position) activity did not occur: Safety/medical concerns Architect: Yes Wheelchair Assistance: Chartered loss adjuster: Power Wheelchair Parts Management: Needs assistance Distance: [id number]  Trunk/Postural Assessment  Cervical Assessment Cervical Assessment: Exceptions to [facility] by [facility]) Thoracic Assessment Thoracic Assessment: Exceptions to [facility](rounded shoulders) Lumbar Assessment Lumbar Assessment: Exceptions to [facility] Center[facility] Control Postural Control: Deficits on evaluation Trunk Control: total A for sitting balance Righting Reactions: impaired/absent Postural  Limitations: impaired  Balance Balance Balance Assessed: Yes Static Sitting Balance Static Sitting - Balance Support: Feet supported Static Sitting - Level of Assistance: 1: +1 Total assist Dynamic Sitting Balance Dynamic Sitting - Balance Support: Feet supported Dynamic Sitting - Level of Assistance: 1: +2 Total assist Extremity Assessment  RUE Assessment Passive Range of Motion (PROM)  Comments: severely limited 2/2 tone and elbow contractures Active Range of Motion (AROM) Comments: able to perform shoulder elevation and some retraction, some bicep activation and trace wrist flex/ext, no AROM in digits RUE Tone RUE Tone: Modified Ashworth Body Part - Modified Ashworth Scale: Elbow Elbow - Modified Ashworth Scale for Grading Hypertonia RUE: Affected part(s) rigid in flexion or extension Modified Ashworth Scale for Grading Hypertonia RUE: Affected part(s) rigid in flexion or extension LUE Assessment LUE Assessment: Exceptions to [facility] Passive Range of Motion (PROM) Comments: severly limited 2/2 tone and elbow contractures, but L contractures better than R Active Range of Motion (AROM) Comments: able to perform shoulder elevation and some retraction, some bicep activation, no AROM in digits and wrist LUE Tone LUE Tone: Modified Ashworth Body Part - Modified Ashworth Scale: Elbow Elbow - Modified Ashworth Scale for Grading Hypertonia LUE: Considerable increase in muschle tone, passive movement difficult Modified Ashworth Scale for Grading Hypertonia LUE: Considerable increase in muschle tone, passive movement difficult RLE Assessment RLE Assessment: Exceptions to [facility] Passive Range of Motion (PROM) Comments: tight hips, knees, and ankles General Strength Comments: 0/5 RLE Strength RLE Overall Strength Comments: 0/5 LLE Assessment LLE Assessment: Exceptions to [facility] Passive Range of Motion (PROM) Comments: tight hips, knees, ankles General Strength Comments: 0/5 LLE Strength LLE Overall  Strength Comments: 0/5    Rosita DeChalus Excell Seltzer, PT, DPT, CSRS  05/25/2021, 4:20 PM

## 2021-05-28 ENCOUNTER — Telehealth: Payer: Self-pay | Admitting: *Deleted

## 2021-05-28 NOTE — Telephone Encounter (Signed)
Trish called from [facility] and stated they were going out to open the case on Luke Wright for pvt duty nursing wound care.I have given her approval per Dr Berline Chough.

## 2021-05-29 ENCOUNTER — Telehealth: Payer: Self-pay

## 2021-05-29 NOTE — Telephone Encounter (Signed)
Nellie from [facility] called to get oxygen limitations for patient. Phone number is [phone]

## 2021-05-30 NOTE — Telephone Encounter (Signed)
Keep sats above 92% - bottom line- ML- ABG showed pO2 of 69- so , he NEEDS O2.

## 2021-06-01 ENCOUNTER — Telehealth: Payer: Self-pay

## 2021-06-01 NOTE — Telephone Encounter (Signed)
Spoke with Baird Lyons from [employer] and they stated an order was received for the oxygen

## 2021-06-01 NOTE — Telephone Encounter (Signed)
If he's in hospital- then they need to order them when he leaves, if he's admitted- they need to order these supplies as well- but will write an order for wound care supplies- ML

## 2021-06-01 NOTE — Telephone Encounter (Signed)
Luke Wright, [facility] called stating patient needs wound care and urinary supplies.  I called Luke Wright back to inform her that the orders have been sent. She told me at that time the patient went back to the hospital last night and is still there to get his wound checked because its not healing and its getting worse with spine being exposed.

## 2021-06-05 ENCOUNTER — Telehealth: Payer: Self-pay

## 2021-06-05 NOTE — Telephone Encounter (Signed)
Kathrynn Speed from [employer] called stating that patient is being discharged from [facility] and he has new orders. New orders are he will go home with right IJ powerline for [facility] IV antibiotic infusions with Cephopin 6 grams over 24 hours through Dec 3. Wound care to sacral 2 times a day.

## 2021-06-11 ENCOUNTER — Other Ambulatory Visit: Payer: Self-pay

## 2021-06-11 ENCOUNTER — Emergency Department (HOSPITAL_COMMUNITY)
Admission: EM | Admit: 2021-06-11 | Discharge: 2021-06-12 | Disposition: A | Discharge status: Home / Self Care | Payer: 59 | Attending: Emergency Medicine | Admitting: Emergency Medicine

## 2021-06-11 ENCOUNTER — Encounter (HOSPITAL_COMMUNITY): Payer: Self-pay | Admitting: Emergency Medicine

## 2021-06-11 ENCOUNTER — Emergency Department (HOSPITAL_COMMUNITY): Payer: 59

## 2021-06-11 DIAGNOSIS — N3289 Other specified disorders of bladder: Secondary | ICD-10-CM | POA: Diagnosis not present

## 2021-06-11 DIAGNOSIS — Z79899 Other long term (current) drug therapy: Secondary | ICD-10-CM | POA: Insufficient documentation

## 2021-06-11 DIAGNOSIS — L89324 Pressure ulcer of left buttock, stage 4: Secondary | ICD-10-CM | POA: Diagnosis present

## 2021-06-11 DIAGNOSIS — I10 Essential (primary) hypertension: Secondary | ICD-10-CM | POA: Diagnosis not present

## 2021-06-11 LAB — COMPREHENSIVE METABOLIC PANEL
ALT: 10 U/L (ref 0–44)
AST: 12 U/L — ABNORMAL LOW (ref 15–41)
Albumin: 2.5 g/dL — ABNORMAL LOW (ref 3.5–5.0)
Alkaline Phosphatase: 71 U/L (ref 38–126)
Anion gap: 7 (ref 5–15)
BUN: 9 mg/dL (ref 6–20)
CO2: 27 mmol/L (ref 22–32)
Calcium: 8.5 mg/dL — ABNORMAL LOW (ref 8.9–10.3)
Chloride: 101 mmol/L (ref 98–111)
Creatinine, Ser: 0.41 mg/dL — ABNORMAL LOW (ref 0.61–1.24)
GFR, Estimated: >60 mL/min (ref >60)
Glucose, Bld: 96 mg/dL (ref 70–99)
Potassium: 3.6 mmol/L (ref 3.5–5.1)
Sodium: 135 mmol/L (ref 135–145)
Total Bilirubin: 0.3 mg/dL (ref 0.3–1.2)
Total Protein: 6.9 g/dL (ref 6.5–8.1)

## 2021-06-11 LAB — CBC WITH DIFFERENTIAL/PLATELET
Abs Immature Granulocytes: 0.04 10*3/uL (ref 0.00–0.07)
Basophils Absolute: 0 10*3/uL (ref 0.0–0.1)
Basophils Relative: 0 %
Eosinophils Absolute: 0.4 10*3/uL (ref 0.0–0.5)
Eosinophils Relative: 4 %
HCT: 34.3 % — ABNORMAL LOW (ref 39.0–52.0)
Hemoglobin: 10.7 g/dL — ABNORMAL LOW (ref 13.0–17.0)
Immature Granulocytes: 1 %
Lymphocytes Relative: 34 %
Lymphs Abs: 3 10*3/uL (ref 0.7–4.0)
MCH: 26 pg (ref 26.0–34.0)
MCHC: 31.2 g/dL (ref 30.0–36.0)
MCV: 83.3 fL (ref 80.0–100.0)
Monocytes Absolute: 0.7 10*3/uL (ref 0.1–1.0)
Monocytes Relative: 8 %
Neutro Abs: 4.6 10*3/uL (ref 1.7–7.7)
Neutrophils Relative %: 53 %
Platelets: 417 10*3/uL — ABNORMAL HIGH (ref 150–400)
RBC: 4.12 MIL/uL — ABNORMAL LOW (ref 4.22–5.81)
RDW: 16.5 % — ABNORMAL HIGH (ref 11.5–15.5)
WBC: 8.7 10*3/uL (ref 4.0–10.5)
nRBC: 0 % (ref 0.0–0.2)

## 2021-06-11 LAB — LACTIC ACID, PLASMA: Lactic Acid, Venous: 0.9 mmol/L (ref 0.5–1.9)

## 2021-06-11 LAB — SEDIMENTATION RATE: Sed Rate: 72 mm/hr — ABNORMAL HIGH (ref 0–16)

## 2021-06-11 MED ORDER — IOHEXOL 350 MG/ML SOLN
80.0000 mL | Freq: Once | INTRAVENOUS | Status: AC | PRN
  Administered 2021-06-11: 22:00:00 80 mL via INTRAVENOUS

## 2021-06-11 NOTE — Discharge Instructions (Signed)
Please bring Luke Wright back to the emergency department if he develops any new or worsening symptoms.

## 2021-06-11 NOTE — ED Triage Notes (Signed)
Patient presents from home with complaints of non healing sacral wounds. He has had the wounds for about 6-7 months. The family reports gangrene and exposed bone. He was given antibiotics on Tuesday, but there still hasn't been any improvement. Patient also had a fever yesterday. The patient has a trach.    EMS vitals: 130/98 BP 90 HR 96 SPO2 97.9 Temp 18 RR
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
## 2021-06-11 NOTE — ED Notes (Signed)
[facility] called for transport.  

## 2021-06-11 NOTE — Progress Notes (Signed)
Patient was suctioned thru trach. Trach collar appears clean, dry and secure. Patient tolerated suction well with only moderate white secretions. Patient was then placed on a humidified oxygen trach collar at 8 L and 35% FIO2. Patient appears to be in no distress and tolerating well at this time with saturation 95%. Breath sounds are clear. RT will continue to monitor

## 2021-06-12 NOTE — ED Notes (Signed)
[facility] here for transport. 

## 2021-06-12 NOTE — ED Notes (Signed)
Wet to dry dressing applied using kerlex and 2 guaze on the outer surface. Mepilex dressing applied on top. Patient was readjusted in bed and given something to drink.

## 2021-06-12 NOTE — Telephone Encounter (Signed)
Erin notified. She states she was just informing of the changes since discharged.

## 2021-06-15 ENCOUNTER — Telehealth: Payer: Self-pay | Admitting: Physical Medicine and Rehabilitation
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
## 2021-06-17 LAB — CULTURE, BLOOD (ROUTINE X 2)
Culture: NO GROWTH
Culture: NO GROWTH
Special Requests: ADEQUATE
Special Requests: ADEQUATE

## 2021-06-18 ENCOUNTER — Ambulatory Visit: Payer: 59 | Admitting: Urology

## 2021-06-19 ENCOUNTER — Encounter: Payer: Self-pay | Admitting: Urology

## 2021-06-25 ENCOUNTER — Encounter: Payer: 59 | Admitting: Physical Medicine and Rehabilitation

## 2021-06-27 ENCOUNTER — Encounter: Payer: 59 | Attending: Physical Medicine and Rehabilitation | Admitting: Physical Medicine and Rehabilitation

## 2021-06-27 ENCOUNTER — Ambulatory Visit: Payer: 59 | Admitting: Internal Medicine

## 2021-07-10 ENCOUNTER — Other Ambulatory Visit (HOSPITAL_COMMUNITY): Payer: Self-pay

## 2021-07-10 MED ORDER — MIDODRINE HCL 5 MG PO TABS: ORAL_TABLET | ORAL | 0 refills | Status: DC

## 2021-10-08 ENCOUNTER — Telehealth: Payer: Self-pay | Admitting: *Deleted

## 2021-10-08 NOTE — Telephone Encounter (Signed)
I signed it for Misty Stanley- thanks- ML

## 2021-10-08 NOTE — Telephone Encounter (Signed)
McKenzie called about a face to face form that needed to be signed related to the order for SN at discharge from inpt rehab in November 2022. This is not related to his current discharge to a SNF from [facility].  The order was in Auria's discharge progress note. ?

## 2022-02-07 ENCOUNTER — Inpatient Hospital Stay (HOSPITAL_COMMUNITY): Payer: 59

## 2022-02-07 ENCOUNTER — Inpatient Hospital Stay (HOSPITAL_COMMUNITY)
Admission: AD | Admit: 2022-02-07 | Discharge: 2022-02-13 | DRG: 698 | Disposition: A | Discharge status: Skilled Nursing Facility | Payer: 59 | Source: Other Acute Inpatient Hospital | Attending: Internal Medicine | Admitting: Internal Medicine

## 2022-02-07 DIAGNOSIS — Z809 Family history of malignant neoplasm, unspecified: Secondary | ICD-10-CM

## 2022-02-07 DIAGNOSIS — G8250 Quadriplegia, unspecified: Secondary | ICD-10-CM | POA: Diagnosis present

## 2022-02-07 DIAGNOSIS — I21A1 Myocardial infarction type 2: Secondary | ICD-10-CM | POA: Diagnosis not present

## 2022-02-07 DIAGNOSIS — L8990 Pressure ulcer of unspecified site, unspecified stage: Secondary | ICD-10-CM | POA: Insufficient documentation

## 2022-02-07 DIAGNOSIS — A0472 Enterocolitis due to Clostridium difficile, not specified as recurrent: Secondary | ICD-10-CM | POA: Diagnosis present

## 2022-02-07 DIAGNOSIS — E785 Hyperlipidemia, unspecified: Secondary | ICD-10-CM | POA: Diagnosis present

## 2022-02-07 DIAGNOSIS — I472 Ventricular tachycardia, unspecified: Secondary | ICD-10-CM | POA: Diagnosis present

## 2022-02-07 DIAGNOSIS — Z8619 Personal history of other infectious and parasitic diseases: Secondary | ICD-10-CM

## 2022-02-07 DIAGNOSIS — E876 Hypokalemia: Secondary | ICD-10-CM | POA: Diagnosis present

## 2022-02-07 DIAGNOSIS — D638 Anemia in other chronic diseases classified elsewhere: Secondary | ICD-10-CM | POA: Diagnosis present

## 2022-02-07 DIAGNOSIS — T83511A Infection and inflammatory reaction due to indwelling urethral catheter, initial encounter: Secondary | ICD-10-CM | POA: Diagnosis present

## 2022-02-07 DIAGNOSIS — Z86711 Personal history of pulmonary embolism: Secondary | ICD-10-CM

## 2022-02-07 DIAGNOSIS — Z931 Gastrostomy status: Secondary | ICD-10-CM

## 2022-02-07 DIAGNOSIS — B377 Candidal sepsis: Secondary | ICD-10-CM | POA: Diagnosis present

## 2022-02-07 DIAGNOSIS — I3139 Other pericardial effusion (noninflammatory): Secondary | ICD-10-CM | POA: Diagnosis present

## 2022-02-07 DIAGNOSIS — Y731 Therapeutic (nonsurgical) and rehabilitative gastroenterology and urology devices associated with adverse incidents: Secondary | ICD-10-CM | POA: Diagnosis present

## 2022-02-07 DIAGNOSIS — L89154 Pressure ulcer of sacral region, stage 4: Secondary | ICD-10-CM | POA: Diagnosis present

## 2022-02-07 DIAGNOSIS — I69351 Hemiplegia and hemiparesis following cerebral infarction affecting right dominant side: Secondary | ICD-10-CM | POA: Diagnosis not present

## 2022-02-07 DIAGNOSIS — L89612 Pressure ulcer of right heel, stage 2: Secondary | ICD-10-CM | POA: Diagnosis present

## 2022-02-07 DIAGNOSIS — S14109D Unspecified injury at unspecified level of cervical spinal cord, subsequent encounter: Secondary | ICD-10-CM

## 2022-02-07 DIAGNOSIS — I5020 Unspecified systolic (congestive) heart failure: Secondary | ICD-10-CM | POA: Diagnosis not present

## 2022-02-07 DIAGNOSIS — L89312 Pressure ulcer of right buttock, stage 2: Secondary | ICD-10-CM | POA: Diagnosis present

## 2022-02-07 DIAGNOSIS — R6521 Severe sepsis with septic shock: Secondary | ICD-10-CM | POA: Diagnosis present

## 2022-02-07 DIAGNOSIS — Z6823 Body mass index (BMI) 23.0-23.9, adult: Secondary | ICD-10-CM

## 2022-02-07 DIAGNOSIS — Z79899 Other long term (current) drug therapy: Secondary | ICD-10-CM

## 2022-02-07 DIAGNOSIS — N390 Urinary tract infection, site not specified: Secondary | ICD-10-CM | POA: Diagnosis present

## 2022-02-07 DIAGNOSIS — A419 Sepsis, unspecified organism: Secondary | ICD-10-CM | POA: Diagnosis present

## 2022-02-07 DIAGNOSIS — I5021 Acute systolic (congestive) heart failure: Secondary | ICD-10-CM | POA: Diagnosis present

## 2022-02-07 DIAGNOSIS — L89892 Pressure ulcer of other site, stage 2: Secondary | ICD-10-CM | POA: Diagnosis present

## 2022-02-07 DIAGNOSIS — I9589 Other hypotension: Secondary | ICD-10-CM | POA: Diagnosis not present

## 2022-02-07 DIAGNOSIS — Z7901 Long term (current) use of anticoagulants: Secondary | ICD-10-CM

## 2022-02-07 DIAGNOSIS — E43 Unspecified severe protein-calorie malnutrition: Secondary | ICD-10-CM | POA: Diagnosis present

## 2022-02-07 DIAGNOSIS — Z86718 Personal history of other venous thrombosis and embolism: Secondary | ICD-10-CM

## 2022-02-07 DIAGNOSIS — Z930 Tracheostomy status: Secondary | ICD-10-CM

## 2022-02-07 DIAGNOSIS — Z8249 Family history of ischemic heart disease and other diseases of the circulatory system: Secondary | ICD-10-CM

## 2022-02-07 DIAGNOSIS — I110 Hypertensive heart disease with heart failure: Secondary | ICD-10-CM | POA: Diagnosis present

## 2022-02-07 DIAGNOSIS — Z841 Family history of disorders of kidney and ureter: Secondary | ICD-10-CM

## 2022-02-07 HISTORY — DX: Hyperlipidemia, unspecified: E78.5

## 2022-02-07 HISTORY — DX: Essential (primary) hypertension: I10

## 2022-02-07 LAB — COMPREHENSIVE METABOLIC PANEL
ALT: 15 U/L (ref 0–44)
AST: 19 U/L (ref 15–41)
Albumin: <1.5 g/dL — ABNORMAL LOW (ref 3.5–5.0)
Alkaline Phosphatase: 148 U/L — ABNORMAL HIGH (ref 38–126)
Anion gap: 4 — ABNORMAL LOW (ref 5–15)
BUN: 10 mg/dL (ref 6–20)
CO2: 22 mmol/L (ref 22–32)
Calcium: 7.5 mg/dL — ABNORMAL LOW (ref 8.9–10.3)
Chloride: 116 mmol/L — ABNORMAL HIGH (ref 98–111)
Creatinine, Ser: 0.69 mg/dL (ref 0.61–1.24)
GFR, Estimated: >60 mL/min (ref >60)
Glucose, Bld: 147 mg/dL — ABNORMAL HIGH (ref 70–99)
Potassium: 2.4 mmol/L — CL (ref 3.5–5.1)
Sodium: 142 mmol/L (ref 135–145)
Total Bilirubin: 0.5 mg/dL (ref 0.3–1.2)
Total Protein: 4.7 g/dL — ABNORMAL LOW (ref 6.5–8.1)

## 2022-02-07 LAB — HIV ANTIBODY (ROUTINE TESTING W REFLEX): HIV Screen 4th Generation wRfx: NONREACTIVE

## 2022-02-07 LAB — CBC
HCT: 26.7 % — ABNORMAL LOW (ref 39.0–52.0)
Hemoglobin: 8.3 g/dL — ABNORMAL LOW (ref 13.0–17.0)
MCH: 23.8 pg — ABNORMAL LOW (ref 26.0–34.0)
MCHC: 31.1 g/dL (ref 30.0–36.0)
MCV: 76.5 fL — ABNORMAL LOW (ref 80.0–100.0)
Platelets: 627 10*3/uL — ABNORMAL HIGH (ref 150–400)
RBC: 3.49 MIL/uL — ABNORMAL LOW (ref 4.22–5.81)
RDW: 20.6 % — ABNORMAL HIGH (ref 11.5–15.5)
WBC: 14.6 10*3/uL — ABNORMAL HIGH (ref 4.0–10.5)
nRBC: 0 % (ref 0.0–0.2)

## 2022-02-07 LAB — COOXEMETRY PANEL
Carboxyhemoglobin: 1.4 % (ref 0.5–1.5)
Methemoglobin: <0.7 % (ref 0.0–1.5)
O2 Saturation: 78.3 %
Total hemoglobin: 8.8 g/dL — ABNORMAL LOW (ref 12.0–16.0)

## 2022-02-07 LAB — CORTISOL: Cortisol, Plasma: 8.7 ug/dL

## 2022-02-07 LAB — GLUCOSE, CAPILLARY
Glucose-Capillary: 112 mg/dL — ABNORMAL HIGH (ref 70–99)
Glucose-Capillary: 124 mg/dL — ABNORMAL HIGH (ref 70–99)

## 2022-02-07 LAB — TROPONIN I (HIGH SENSITIVITY): Troponin I (High Sensitivity): 2231 ng/L (ref <18)

## 2022-02-07 LAB — LACTIC ACID, PLASMA: Lactic Acid, Venous: 1 mmol/L (ref 0.5–1.9)

## 2022-02-07 LAB — PROCALCITONIN: Procalcitonin: <0.1 ng/mL

## 2022-02-07 MED ORDER — POTASSIUM CHLORIDE 10 MEQ/100ML IV SOLN
10.0000 meq | INTRAVENOUS | Status: AC
  Administered 2022-02-07 – 2022-02-08 (×4): 10 meq via INTRAVENOUS
  Filled 2022-02-07 (×4): qty 100

## 2022-02-07 MED ORDER — PIPERACILLIN-TAZOBACTAM 3.375 G IVPB
3.3750 g | Freq: Three times a day (TID) | INTRAVENOUS | Status: DC
  Administered 2022-02-07 – 2022-02-08 (×2): 3.375 g via INTRAVENOUS
  Filled 2022-02-07 (×3): qty 50

## 2022-02-07 MED ORDER — ONDANSETRON HCL 4 MG/2ML IJ SOLN: 4.0000 mg | Freq: Four times a day (QID) | INTRAMUSCULAR | Status: DC | PRN

## 2022-02-07 MED ORDER — AMIODARONE HCL IN DEXTROSE 360-4.14 MG/200ML-% IV SOLN
60.0000 mg/h | INTRAVENOUS | Status: AC
  Administered 2022-02-07: 60 mg/h via INTRAVENOUS
  Filled 2022-02-07: qty 200

## 2022-02-07 MED ORDER — KCL IN DEXTROSE-NACL 40-5-0.45 MEQ/L-%-% IV SOLN
INTRAVENOUS | Status: DC
  Filled 2022-02-07 (×4): qty 1000

## 2022-02-07 MED ORDER — HEPARIN SODIUM (PORCINE) 5000 UNIT/ML IJ SOLN
5000.0000 [IU] | Freq: Three times a day (TID) | INTRAMUSCULAR | Status: DC
Start: 2022-02-07 — End: 2022-02-08
  Administered 2022-02-07 – 2022-02-08 (×2): 5000 [IU] via SUBCUTANEOUS
  Filled 2022-02-07 (×2): qty 1

## 2022-02-07 MED ORDER — AMIODARONE HCL IN DEXTROSE 360-4.14 MG/200ML-% IV SOLN
30.0000 mg/h | INTRAVENOUS | Status: DC
  Administered 2022-02-08: 30 mg/h via INTRAVENOUS
  Filled 2022-02-07: qty 200

## 2022-02-07 MED ORDER — NOREPINEPHRINE 4 MG/250ML-% IV SOLN
INTRAVENOUS | Status: AC
  Filled 2022-02-07: qty 250

## 2022-02-07 MED ORDER — POLYETHYLENE GLYCOL 3350 17 G PO PACK: 17.0000 g | PACK | Freq: Every day | ORAL | Status: DC | PRN

## 2022-02-07 MED ORDER — DOCUSATE SODIUM 100 MG PO CAPS: 100.0000 mg | ORAL_CAPSULE | Freq: Two times a day (BID) | ORAL | Status: DC | PRN

## 2022-02-07 MED ORDER — VANCOMYCIN HCL 750 MG/150ML IV SOLN
750.0000 mg | Freq: Two times a day (BID) | INTRAVENOUS | Status: DC
  Administered 2022-02-07 – 2022-02-08 (×3): 750 mg via INTRAVENOUS
  Filled 2022-02-07 (×4): qty 150

## 2022-02-07 MED ORDER — POTASSIUM CHLORIDE 20 MEQ PO PACK
40.0000 meq | PACK | Freq: Once | ORAL | Status: AC
  Administered 2022-02-07: 40 meq via ORAL
  Filled 2022-02-07: qty 2

## 2022-02-07 MED ORDER — MAGNESIUM SULFATE 2 GM/50ML IV SOLN
2.0000 g | Freq: Once | INTRAVENOUS | Status: AC
Start: 2022-02-07 — End: 2022-02-07
  Administered 2022-02-07: 2 g via INTRAVENOUS
  Filled 2022-02-07: qty 50

## 2022-02-07 NOTE — Progress Notes (Addendum)
Pharmacy Antibiotic Note  Luke Wright is a 51 y.o. male admitted on 02/07/2022 with sepsis.  Pt transferred from [facility] ED. Pharmacy has been consulted for Zosyn and vancomycin dosing.  Pt has been receiving antibiotics at SNF prior to ED Visit. Per [facility] ED RN, [facility] reports the following antibiotics given since 01/12/22 (unclear of start date):  - Doxycycline 100mg  PO q12h (last given 7/27 08:00) - Vancomycin 750mg  IV q12h (last given 7/27 08:00) - Zosyn 3.375g IV q6h (last dose given 7/27 06:00)  Pt received Cefepime 2g IV x1 at 11:15 on 7/27  WBC 14.2, Scr 0.7 - at OSH CrCl ~120 ml/min  Plan: Initiate Zosyn 3.375g IV q8h (4 hour infusion). Vancomycin 750mg  IV q12h as per previous regimen from SNF     > Goal AUC 400-550     > Check vancomycin levels on 7/29 Do not continue doxycycline at this time, per Dr. 8/27 Monitor daily WBC, temp, SCr, and clinical s/sx of infection   Height: 5' 10.87" (180 cm) Weight: 75.8 kg (167 lb 1.7 oz) IBW/kg (Calculated) : 75  No data recorded.  Recent Labs  Lab 02/07/22 2023 02/07/22 2106  WBC 14.6*  --   CREATININE 0.69  --   LATICACIDVEN  --  1.0    Estimated Creatinine Clearance: 117.2 mL/min (by C-G formula based on SCr of 0.69 mg/dL).    No Known Allergies  Antimicrobials this admission: Zosyn 7/27 >>  Vanc 7/27 >>   Dose adjustments this admission: N/A  Microbiology results: 7/27 Bcx x2: ordered 7/27 UCx: ordered   Thank you for allowing pharmacy to be a part of this patient's care.  8/27 02/07/2022 7:23 PM  Addendum: MAR reviewed and antibiotic orders at SNF can be clarified as the following: Doxycycline 100mg  PO BID 7/14 > 7/20 Zosyn 3.375g IV q6h 7/19 > 7/27 (last dose 0600 7/27) Vancomycin 1000mg  IV q12h 7/19 > 7/24 AM Vancomycin 750mg  IV q12h 7/24 PM > 7/26 (last dose 2000 7/26)  8/19, PharmD, BCPS Clinical Pharmacist 02/07/2022 10:42 PM   Please refer to AMION for pharmacy phone  number
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
## 2022-02-07 NOTE — Progress Notes (Signed)
PICC line removed from left thigh; placed at outside facility (nursing home). Removed per MD order d/t suspected infection. Site was slightly reddened however no sign of purulent drainage or swelling at site. Vaseline gauze pressure dressing placed over site. Ok to remove dressing tomorrow at [employer].   Onyx Schirmer Loyola Mast, RN

## 2022-02-08 ENCOUNTER — Inpatient Hospital Stay (HOSPITAL_COMMUNITY): Payer: 59

## 2022-02-08 DIAGNOSIS — R6521 Severe sepsis with septic shock: Secondary | ICD-10-CM | POA: Diagnosis not present

## 2022-02-08 DIAGNOSIS — A419 Sepsis, unspecified organism: Secondary | ICD-10-CM | POA: Diagnosis not present

## 2022-02-08 DIAGNOSIS — I9589 Other hypotension: Secondary | ICD-10-CM | POA: Diagnosis not present

## 2022-02-08 DIAGNOSIS — L8990 Pressure ulcer of unspecified site, unspecified stage: Secondary | ICD-10-CM | POA: Insufficient documentation

## 2022-02-08 LAB — URINALYSIS, ROUTINE W REFLEX MICROSCOPIC
Bilirubin Urine: NEGATIVE
Glucose, UA: NEGATIVE mg/dL
Hgb urine dipstick: NEGATIVE
Ketones, ur: NEGATIVE mg/dL
Nitrite: NEGATIVE
Protein, ur: NEGATIVE mg/dL
Specific Gravity, Urine: 1.009 (ref 1.005–1.030)
WBC, UA: >50 WBC/hpf — ABNORMAL HIGH (ref 0–5)
pH: 5 (ref 5.0–8.0)

## 2022-02-08 LAB — GLUCOSE, CAPILLARY
Glucose-Capillary: 118 mg/dL — ABNORMAL HIGH (ref 70–99)
Glucose-Capillary: 106 mg/dL — ABNORMAL HIGH (ref 70–99)
Glucose-Capillary: 100 mg/dL — ABNORMAL HIGH (ref 70–99)
Glucose-Capillary: 85 mg/dL (ref 70–99)
Glucose-Capillary: 119 mg/dL — ABNORMAL HIGH (ref 70–99)

## 2022-02-08 LAB — ECHOCARDIOGRAM COMPLETE
Area-P 1/2: 3.77 cm2
Calc EF: 33 %
Height: 70.87 in
S' Lateral: 2.85 cm
Single Plane A2C EF: 37.9 %
Single Plane A4C EF: 31.9 %
Weight: 2723.12 oz

## 2022-02-08 LAB — MRSA NEXT GEN BY PCR, NASAL: MRSA by PCR Next Gen: DETECTED — AB

## 2022-02-08 LAB — BASIC METABOLIC PANEL
Anion gap: 3 — ABNORMAL LOW (ref 5–15)
Anion gap: 4 — ABNORMAL LOW (ref 5–15)
BUN: 9 mg/dL (ref 6–20)
BUN: 5 mg/dL — ABNORMAL LOW (ref 6–20)
CO2: 21 mmol/L — ABNORMAL LOW (ref 22–32)
CO2: 21 mmol/L — ABNORMAL LOW (ref 22–32)
Calcium: 7.2 mg/dL — ABNORMAL LOW (ref 8.9–10.3)
Calcium: 7.3 mg/dL — ABNORMAL LOW (ref 8.9–10.3)
Chloride: 115 mmol/L — ABNORMAL HIGH (ref 98–111)
Chloride: 113 mmol/L — ABNORMAL HIGH (ref 98–111)
Creatinine, Ser: 0.64 mg/dL (ref 0.61–1.24)
Creatinine, Ser: 0.59 mg/dL — ABNORMAL LOW (ref 0.61–1.24)
GFR, Estimated: >60 mL/min (ref >60)
GFR, Estimated: >60 mL/min (ref >60)
Glucose, Bld: 108 mg/dL — ABNORMAL HIGH (ref 70–99)
Glucose, Bld: 88 mg/dL (ref 70–99)
Potassium: 3.5 mmol/L (ref 3.5–5.1)
Potassium: 2.9 mmol/L — ABNORMAL LOW (ref 3.5–5.1)
Sodium: 139 mmol/L (ref 135–145)
Sodium: 138 mmol/L (ref 135–145)

## 2022-02-08 LAB — CBC
HCT: 25 % — ABNORMAL LOW (ref 39.0–52.0)
Hemoglobin: 7.8 g/dL — ABNORMAL LOW (ref 13.0–17.0)
MCH: 24.1 pg — ABNORMAL LOW (ref 26.0–34.0)
MCHC: 31.2 g/dL (ref 30.0–36.0)
MCV: 77.2 fL — ABNORMAL LOW (ref 80.0–100.0)
Platelets: 548 10*3/uL — ABNORMAL HIGH (ref 150–400)
RBC: 3.24 MIL/uL — ABNORMAL LOW (ref 4.22–5.81)
RDW: 20.7 % — ABNORMAL HIGH (ref 11.5–15.5)
WBC: 12.2 10*3/uL — ABNORMAL HIGH (ref 4.0–10.5)
nRBC: 0 % (ref 0.0–0.2)

## 2022-02-08 LAB — C DIFFICILE QUICK SCREEN W PCR REFLEX
C Diff antigen: POSITIVE — AB
C Diff toxin: NEGATIVE

## 2022-02-08 LAB — CLOSTRIDIUM DIFFICILE BY PCR, REFLEXED: Toxigenic C. Difficile by PCR: POSITIVE — AB

## 2022-02-08 LAB — TROPONIN I (HIGH SENSITIVITY)
Troponin I (High Sensitivity): 1888 ng/L (ref <18)
Troponin I (High Sensitivity): 1351 ng/L (ref <18)

## 2022-02-08 LAB — MAGNESIUM: Magnesium: 2.3 mg/dL (ref 1.7–2.4)

## 2022-02-08 MED ORDER — SODIUM CHLORIDE 0.9% FLUSH
10.0000 mL | Freq: Two times a day (BID) | INTRAVENOUS | Status: DC
  Administered 2022-02-08 – 2022-02-13 (×10): 10 mL

## 2022-02-08 MED ORDER — CHLORHEXIDINE GLUCONATE CLOTH 2 % EX PADS
6.0000 | MEDICATED_PAD | Freq: Every day | CUTANEOUS | Status: DC
Start: 2022-02-08 — End: 2022-02-13
  Administered 2022-02-08 – 2022-02-13 (×6): 6 via TOPICAL

## 2022-02-08 MED ORDER — ORAL CARE MOUTH RINSE
15.0000 mL | OROMUCOSAL | Status: DC | PRN
Start: 2022-02-08 — End: 2022-02-13

## 2022-02-08 MED ORDER — POTASSIUM CHLORIDE 20 MEQ PO PACK
60.0000 meq | PACK | Freq: Once | ORAL | Status: AC
  Administered 2022-02-08: 60 meq via ORAL
  Filled 2022-02-08: qty 3

## 2022-02-08 MED ORDER — ORAL CARE MOUTH RINSE
15.0000 mL | OROMUCOSAL | Status: DC
  Administered 2022-02-08 – 2022-02-13 (×18): 15 mL via OROMUCOSAL

## 2022-02-08 MED ORDER — PERFLUTREN LIPID MICROSPHERE
1.0000 mL | INTRAVENOUS | Status: AC | PRN
  Administered 2022-02-08: 5 mL via INTRAVENOUS

## 2022-02-08 MED ORDER — VANCOMYCIN HCL 125 MG PO CAPS: 125.0000 mg | ORAL_CAPSULE | Freq: Four times a day (QID) | ORAL | Status: DC

## 2022-02-08 MED ORDER — ORAL CARE MOUTH RINSE: 15.0000 mL | OROMUCOSAL | Status: DC | PRN

## 2022-02-08 MED ORDER — APIXABAN 5 MG PO TABS
5.0000 mg | ORAL_TABLET | Freq: Two times a day (BID) | ORAL | Status: DC
  Administered 2022-02-08 – 2022-02-13 (×11): 5 mg via ORAL
  Filled 2022-02-08 (×11): qty 1

## 2022-02-08 MED ORDER — AMIODARONE HCL 200 MG PO TABS
200.0000 mg | ORAL_TABLET | Freq: Every day | ORAL | Status: DC
  Administered 2022-02-08 – 2022-02-09 (×2): 200 mg via ORAL
  Filled 2022-02-08 (×2): qty 1

## 2022-02-08 MED ORDER — MUPIROCIN 2 % EX OINT
1.0000 | TOPICAL_OINTMENT | Freq: Two times a day (BID) | CUTANEOUS | Status: AC
  Administered 2022-02-08 – 2022-02-12 (×10): 1 via NASAL
  Filled 2022-02-08 (×5): qty 22

## 2022-02-08 MED ORDER — ADULT MULTIVITAMIN W/MINERALS CH
1.0000 | ORAL_TABLET | Freq: Every day | ORAL | Status: DC
  Administered 2022-02-08 – 2022-02-13 (×6): 1 via ORAL
  Filled 2022-02-08 (×6): qty 1

## 2022-02-08 MED ORDER — ALBUMIN HUMAN 5 % IV SOLN
12.5000 g | Freq: Once | INTRAVENOUS | Status: AC
  Administered 2022-02-08: 12.5 g via INTRAVENOUS
  Filled 2022-02-08: qty 250

## 2022-02-08 MED ORDER — VANCOMYCIN HCL 125 MG PO CAPS
125.0000 mg | ORAL_CAPSULE | Freq: Four times a day (QID) | ORAL | Status: DC
  Administered 2022-02-08 – 2022-02-13 (×19): 125 mg via ORAL
  Filled 2022-02-08 (×22): qty 1

## 2022-02-08 MED ORDER — SODIUM CHLORIDE 0.9% FLUSH
10.0000 mL | INTRAVENOUS | Status: DC | PRN
  Administered 2022-02-10: 10 mL

## 2022-02-08 MED ORDER — SODIUM CHLORIDE 0.9 % IV SOLN
2.0000 g | Freq: Three times a day (TID) | INTRAVENOUS | Status: DC
  Administered 2022-02-08 – 2022-02-09 (×2): 2 g via INTRAVENOUS
  Filled 2022-02-08 (×2): qty 12.5

## 2022-02-08 MED ORDER — JUVEN PO PACK
1.0000 | PACK | Freq: Two times a day (BID) | ORAL | Status: DC
  Administered 2022-02-08 – 2022-02-13 (×10): 1 via ORAL
  Filled 2022-02-08 (×11): qty 1
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
## 2022-02-08 NOTE — Progress Notes (Signed)
Patient transferred to 5W07 on speciality bed. Patient's cell phone and cell phone charger are within reach of patient. Report given to [place], Charity fundraiser. Patient resting comfortably in bed with primary nurse at bedside.
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
## 2022-02-08 NOTE — Consult Note (Signed)
WOC Nurse Consult Note: Reason for Consult:multiple wounds Patient quadriplegic; contracted UE bilaterally, trach and PEG Wound type: Coxxyx: Stage 4 Pressure Injury: 7.5cm x 12cm x 2.5cm; 100% clean 2-3. Left  lateral thigh x 2; 1cm 1cm x 0cm Unstageable Pressure Injuries 4. Left lateral calf: 8cm x 2.5cm x 1cm; Stage 3 Pressure Injury: 100% pink, clean 5. Right lateral calf: 30cm x 2.5cm x 1.5cm ; Stage 3 Pressure Injury; 100% clean, pink 6.   Right heel: Stage 2 Pressure Injury; see nursing flow sheet for measurements 7. Right ischial tuberosity: 4cm x 3cm x 0.5cm; Stage 3 Pressure Injury  Pressure Injury POA: Yes/No/NA Measurement: see above  Wound bed: see above  Drainage (amount, consistency, odor) see nursing flow sheet; LEs with minimal serosanguinous drainage  Periwound: intact  Dressing procedure/placement/frequency: Continue normal saline dressings for the coccyx, ischial, left and right lateral calf wounds.   Silicone foam to the left lateral thigh Silicone foam to the heel wound Continue offloading boots for offloading heels at all times RD for wound healing supplementation LALM in place while in the ICU will need air mattress if transfers out of the ICU. Discussed POC with patient and bedside nurse.  Re consult if needed, will not follow at this time. Thanks  Terez Montee [employer], RN,CWOCN, CNS, CWON-AP [phone])
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
## 2022-02-08 NOTE — TOC Progression Note (Signed)
Transition of Care [facility]) - Progression Note    Patient Details  Name: Luke Wright MRN: [id number] Date of Birth: Jun 24, 1971  Transition of Care [facility]) CM/SW Contact  Beckie Busing, RN Phone [phone]  02/08/2022, 12:37 PM  Clinical Narrative:    TOC acknowledges patient arriving from facility. Will assess to determine sending facility. Patient is quadriplegic with trach but not vent dependent. TOC anticipates that the patient will have disposition needs therefore we will follow closely.  Transition of Care [facility]) Screening Note   Patient Details  Name: Luke Wright Date of Birth: 05-09-1971   Transition of Care [facility]) CM/SW Contact:    Beckie Busing, RN Phone Number: 02/08/2022, 12:39 PM    Transition of Care Department [facility]) has reviewed patient and no TOC needs have been identified at this time. We will continue to monitor patient advancement through interdisciplinary progression rounds.           Expected Discharge Plan and Services                                                 Social Determinants of Health (SDOH) Interventions    Readmission Risk Interventions     No data to display

## 2022-02-08 NOTE — Progress Notes (Signed)
Called 5W to see if the speciality bed was there. They are to call back when they are ready for patient.

## 2022-02-08 NOTE — Progress Notes (Signed)
Attending:    Subjective: Admitted for hypothermia and hypoxemia/septic shock in setting of presumed UTI His troponin was elevated, now down tending Had VT yesterday with low K, started amiodarone He has a PICC because he has been treated for sacral decubitus ulcer infections He is awake, alert Started on D5 BP has improved with fluid resuscitation  PICC pulled yesterday  History of DVT/PE in 09/2021  Objective: Vitals:   02/08/22 0530 02/08/22 0600 02/08/22 0749 02/08/22 0924  BP:    (!) 118/55  Pulse: 76   78  Resp: 14   14  Temp:  (!) 97.2 F (36.2 C) (!) 97.3 F (36.3 C)   TempSrc:  Bladder Bladder   SpO2: 100%   98%  Weight:      Height:       FiO2 (%):  [21 %] 21 %  Intake/Output Summary (Last 24 hours) at 02/08/2022 1029 Last data filed at 02/08/2022 0700 Gross per 24 hour  Intake 2252.77 ml  Output 200 ml  Net 2052.77 ml    General:  Resting comfortably in bed HENT: NCAT OP clear PULM: CTA B, normal effort CV: RRR, no mgr GI: BS+, soft, nontender MSK: normal bulk and tone Neuro: awake, alert, no distress, MAEW    CBC    Component Value Date/Time   WBC 12.2 (H) 02/08/2022 0428   RBC 3.24 (L) 02/08/2022 0428   HGB 7.8 (L) 02/08/2022 0428   HCT 25.0 (L) 02/08/2022 0428   PLT 548 (H) 02/08/2022 0428   MCV 77.2 (L) 02/08/2022 0428   MCH 24.1 (L) 02/08/2022 0428   MCHC 31.2 02/08/2022 0428   RDW 20.7 (H) 02/08/2022 0428   LYMPHSABS 3.0 06/11/2021 1936   MONOABS 0.7 06/11/2021 1936   EOSABS 0.4 06/11/2021 1936   BASOSABS 0.0 06/11/2021 1936    BMET    Component Value Date/Time   NA 139 02/08/2022 0428   K 3.5 02/08/2022 0428   CL 115 (H) 02/08/2022 0428   CO2 21 (L) 02/08/2022 0428   GLUCOSE 108 (H) 02/08/2022 0428   BUN 9 02/08/2022 0428   CREATININE 0.64 02/08/2022 0428   CALCIUM 7.2 (L) 02/08/2022 0428   GFRNONAA >60 02/08/2022 0428    CXR images   Impression/Plan: Septic shock either due to UTI, bacteremia/PICC infection or wound  infection > change to cefepime/vanc and f/u blood and urine cultures, off levophed Sacral decubitus ulcers in paraplegic > wound care Demand ischemia with septic shock> no anticoagulation, treat the problems above VT in setting of hypokalemia > Tele, replace K, ask family and patient about low potassium level today Muscle spasms> restart baclofen today Hyperlipidemia > restart statin today 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   My cc time n/a minutes  Heber , MD [facility] Pager: [phone] Cell: [phone] After 7pm: [phone]

## 2022-02-08 NOTE — TOC Progression Note (Signed)
Transition of Care [facility]) - Progression Note    Patient Details  Name: Luke Wright MRN: [id number] Date of Birth: [birth year]  Transition of Care [facility]) CM/SW Contact  Lorri Frederick, LCSW Phone Number: 02/08/2022, 2:59 PM  Clinical Narrative:   CSW informed by RN that pt daughter Luke Wright is requesting a call.  CSW spoke with Luke Wright by phone.  Pt is currently placed at [facility] in [place], Luke Wright does not believe they are taking good care of him and would like for him to be placed elsewhere at DC.  CSW discussed that we can attempt this when pt is ready for DC but cannot hold him in the hospital searching for placement if a new SNF is not available once he is cleared for DC.  Luke Wright voiced understanding of this.          Expected Discharge Plan and Services                                                 Social Determinants of Health (SDOH) Interventions    Readmission Risk Interventions     No data to display

## 2022-02-08 NOTE — Procedures (Signed)
Central Venous Catheter Insertion Procedure Note  Luke Wright  [id number]  [birth year]  Date:02/08/22  Time:3:31 PM   Provider Performing:Jestina Stephani Welton Flakes   Procedure: Insertion of Non-tunneled Central Venous [phone]) with US guidance ([id number])   Indication(s) Medication administration  Consent Risks of the procedure as well as the alternatives and risks of each were explained to the patient and/or caregiver.  Consent for the procedure was obtained and is signed in the bedside chart  Anesthesia Topical only with 1% lidocaine   Timeout Verified patient identification, verified procedure, site/side was marked, verified correct patient position, special equipment/implants available, medications/allergies/relevant history reviewed, required imaging and test results available.  Sterile Technique Maximal sterile technique including full sterile barrier drape, hand hygiene, sterile gown, sterile gloves, mask, hair covering, sterile ultrasound probe cover (if used).  Procedure Description Area of catheter insertion was cleaned with chlorhexidine and draped in sterile fashion.  With real-time ultrasound guidance a central venous catheter was placed into the left internal jugular vein. Nonpulsatile blood flow and easy flushing noted in all ports.  The catheter was sutured in place and sterile dressing applied.  Complications/Tolerance None; patient tolerated the procedure well. Chest X-ray is ordered to verify placement for internal jugular or subclavian cannulation.    EBL Minimal  Specimen(s) None

## 2022-02-09 ENCOUNTER — Other Ambulatory Visit: Payer: Self-pay

## 2022-02-09 ENCOUNTER — Encounter (HOSPITAL_COMMUNITY): Payer: Self-pay | Admitting: Internal Medicine

## 2022-02-09 DIAGNOSIS — A0472 Enterocolitis due to Clostridium difficile, not specified as recurrent: Secondary | ICD-10-CM

## 2022-02-09 DIAGNOSIS — I5020 Unspecified systolic (congestive) heart failure: Secondary | ICD-10-CM | POA: Diagnosis not present

## 2022-02-09 DIAGNOSIS — G8250 Quadriplegia, unspecified: Secondary | ICD-10-CM | POA: Diagnosis not present

## 2022-02-09 DIAGNOSIS — A419 Sepsis, unspecified organism: Secondary | ICD-10-CM | POA: Diagnosis not present

## 2022-02-09 DIAGNOSIS — R6521 Severe sepsis with septic shock: Secondary | ICD-10-CM | POA: Diagnosis not present

## 2022-02-09 LAB — BASIC METABOLIC PANEL
Anion gap: 3 — ABNORMAL LOW (ref 5–15)
BUN: 7 mg/dL (ref 6–20)
CO2: 19 mmol/L — ABNORMAL LOW (ref 22–32)
Calcium: 7.1 mg/dL — ABNORMAL LOW (ref 8.9–10.3)
Chloride: 113 mmol/L — ABNORMAL HIGH (ref 98–111)
Creatinine, Ser: 0.61 mg/dL (ref 0.61–1.24)
GFR, Estimated: >60 mL/min (ref >60)
Glucose, Bld: 82 mg/dL (ref 70–99)
Potassium: 3.5 mmol/L (ref 3.5–5.1)
Sodium: 135 mmol/L (ref 135–145)

## 2022-02-09 LAB — URINE CULTURE: Culture: >=100000 — AB

## 2022-02-09 LAB — VANCOMYCIN, TROUGH: Vancomycin Tr: 25 ug/mL (ref 15–20)

## 2022-02-09 LAB — MAGNESIUM: Magnesium: 1.8 mg/dL (ref 1.7–2.4)

## 2022-02-09 MED ORDER — COSYNTROPIN 0.25 MG IJ SOLR
0.2500 mg | Freq: Once | INTRAMUSCULAR | Status: AC
  Administered 2022-02-10: 0.25 mg via INTRAVENOUS
  Filled 2022-02-09: qty 0.25

## 2022-02-09 MED ORDER — BACLOFEN 20 MG PO TABS
40.0000 mg | ORAL_TABLET | Freq: Three times a day (TID) | ORAL | Status: DC | PRN
  Administered 2022-02-09 – 2022-02-12 (×5): 40 mg via ORAL
  Filled 2022-02-09 (×5): qty 2

## 2022-02-09 MED ORDER — SODIUM CHLORIDE 0.9 % IV SOLN
2.0000 g | Freq: Two times a day (BID) | INTRAVENOUS | Status: DC
  Administered 2022-02-09 – 2022-02-10 (×3): 2 g via INTRAVENOUS
  Filled 2022-02-09 (×4): qty 12.5

## 2022-02-09 MED ORDER — VANCOMYCIN HCL 500 MG/100ML IV SOLN: 500.0000 mg | Freq: Two times a day (BID) | INTRAVENOUS | Status: DC

## 2022-02-09 MED ORDER — MAGNESIUM SULFATE 2 GM/50ML IV SOLN
2.0000 g | Freq: Once | INTRAVENOUS | Status: AC
  Administered 2022-02-09: 2 g via INTRAVENOUS
  Filled 2022-02-09: qty 50

## 2022-02-09 MED ORDER — POTASSIUM CHLORIDE 20 MEQ PO PACK
40.0000 meq | PACK | Freq: Once | ORAL | Status: AC
Start: 2022-02-09 — End: 2022-02-09
  Administered 2022-02-09: 40 meq via ORAL
  Filled 2022-02-09: qty 2

## 2022-02-09 MED ORDER — ZOLPIDEM TARTRATE 5 MG PO TABS
5.0000 mg | ORAL_TABLET | Freq: Every evening | ORAL | Status: DC | PRN
  Administered 2022-02-09 – 2022-02-12 (×5): 5 mg via ORAL
  Filled 2022-02-09 (×5): qty 1

## 2022-02-09 NOTE — Progress Notes (Addendum)
PROGRESS NOTE        PATIENT DETAILS Name: Luke Wright Age: 51 y.o. Sex: male Date of Birth: May 11, 1971 Admit Date: 02/07/2022 Admitting Physician Jacky Kindle, MD [id number], Provider, MD  Brief Summary: Patient is a 51 y.o.  male with history of quadriplegia-stage IV sacral decubitus-trach-resident of SNF-who presented to [facility] ICU from [facility] management of septic shock, V. tach (in a setting of hypokalemia).  Patient was stabilized with IV antibiotics-started on amiodarone-and subsequently transferred to [facility] service on 7/29.  Significant events: 7/27>> transfer to [facility] ICU from [place] shock-V. tach (hypokalemia).  PICC/Foley in place on arrival. 7/29>> transfer to [facility] service.  Significant studies: 7/27>> CXR: Lucency at right apex-artifact versus small pneumothorax. 7/27>> CXR: No pneumothorax.  Skinfolds at bilateral lung apices. 7/28>> Echo: EF 30-35%, global hypokinesis, grade 2 diastolic dysfunction.  Small pericardial effusion.  Significant microbiology data: 7/27>> blood culture: Negative 7/28>> stool C. difficile: Antigen positive/toxin negative. 7/28>> urine culture (catheterized): 100,000 colonies/mL yeast (likely contamination)  Procedures: 7/28>> nontunneled IJ catheter placed by PCCM.  Consults: PCCM Cardiology  Subjective: Lying comfortably in bed-denies any chest pain or shortness of breath.  Objective: Vitals: Blood pressure (!) 106/56, pulse 81, temperature 97.8 F (36.6 C), temperature source Oral, resp. rate (!) 24, height 5\' 10"  (1.778 m), weight 83.9 kg, SpO2 97 %.   Exam: Gen Exam:Alert awake-not in any distress.  Frail-chronically sick appearing. HEENT:atraumatic, normocephalic Chest: B/L clear to auscultation anteriorly CVS:S1S2 regular Abdomen:soft non tender, non distended Extremities:no edema Neurology: Quadriplegic. Skin: no rash  Pertinent Labs/Radiology:    Latest Ref Rng & Units  02/08/2022    4:28 AM 02/07/2022    8:23 PM 06/11/2021    7:36 PM  CBC  WBC 4.0 - 10.5 K/uL 12.2  14.6  8.7   Hemoglobin 13.0 - 17.0 g/dL 7.8  8.3  10.7   Hematocrit 39.0 - 52.0 % 25.0  26.7  34.3   Platelets 150 - 400 K/uL 548  627  417     Lab Results  Component Value Date   NA 135 02/09/2022   K 3.5 02/09/2022   CL 113 (H) 02/09/2022   CO2 19 (L) 02/09/2022      Assessment/Plan: Septic shock: Multiple etiologies including complicated UTI/possible C. difficile colitis-sepsis physiology improved-continue cefepime/oral vancomycin.  Since blood cultures negative-stop IV vancomycin.  Random cortisol on 7/27 at 8.7 (seems to be inappropriately low for clinical situation)-I have ordered a ACTH stimulation test for tomorrow morning.  Possible C. difficile colitis: Has ongoing diarrhea-given numerous hospitalizations/antibiotics exposure-reasonable to treat with oral vancomycin.  Ventricular tachycardia: Maintaining sinus rhythm-continue amiodarone-Per chart review this was in the setting of hypokalemia.  However has significant LV dysfunction.  Cardiology consulted this morning.  Keep K >4, Mg >2.  Newly diagnosed HFrEF: Volume status stable/compensated-doubt any utility in proceeding with ischemic evaluation given patient's overall clinical situation.  Await further input from cardiology.  Elevated troponin: Likely due to demand ischemia-however has significant LV dysfunction-await cardiology input.  Do not think he is a good candidate for ischemic evaluation.  Suspect better to manage medically as much as possible  Small pericardial effusion: Incidental finding on echo-repeat echo in a few weeks.  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  Hypokalemia: Likely in the setting of diarrhea-Will replete again today in an attempt to keep it more than 4.  History of sacral osteomyelitis/stage IV sacral decubitus ulcer/bilateral leg ulcers: All present prior to hospitalization-continue per wound  care.  History of traumatic quadriplegia-sp /trach-SNF resident  Nutrition Status: Nutrition Problem: Increased nutrient needs Etiology: wound healing Signs/Symptoms: estimated needs Interventions: MVI, Juven  Pressure Ulcer: Pressure Injury 02/07/22 Coccyx Posterior Stage 4 - Full thickness tissue loss with exposed bone, tendon or muscle. large stage 3 with undermining all around; no drainage, (Active)  02/07/22 1830  Location: Coccyx  Location Orientation: Posterior  Staging: Stage 4 - Full thickness tissue loss with exposed bone, tendon or muscle.  Wound Description (Comments): large stage 3 with undermining all around; no drainage,  Present on Admission:   Dressing Type Foam - Lift dressing to assess site every shift 02/08/22 2100     Pressure Injury 02/07/22 Tibial Right;Lateral Stage 2 -  Partial thickness loss of dermis presenting as a shallow open injury with a red, pink wound bed without slough. (Active)  02/07/22 1845  Location: Tibial  Location Orientation: Right;Lateral  Staging: Stage 2 -  Partial thickness loss of dermis presenting as a shallow open injury with a red, pink wound bed without slough.  Wound Description (Comments):   Present on Admission: Yes  Dressing Type Foam - Lift dressing to assess site every shift 02/08/22 2100     Pressure Injury 02/07/22 Tibial Left;Lateral Unstageable - Full thickness tissue loss in which the base of the injury is covered by slough (yellow, tan, gray, green or brown) and/or eschar (tan, brown or black) in the wound bed. (Active)  02/07/22 1845  Location: Tibial  Location Orientation: Left;Lateral  Staging: Unstageable - Full thickness tissue loss in which the base of the injury is covered by slough (yellow, tan, gray, green or brown) and/or eschar (tan, brown or black) in the wound bed.  Wound Description (Comments):   Present on Admission: Yes  Dressing Type Foam - Lift dressing to assess site every shift 02/08/22 2100      Pressure Injury 02/07/22 Tibial Left;Proximal;Lateral Stage 2 -  Partial thickness loss of dermis presenting as a shallow open injury with a red, pink wound bed without slough. dry wound bed (Active)  02/07/22 1845  Location: Tibial  Location Orientation: Left;Proximal;Lateral  Staging: Stage 2 -  Partial thickness loss of dermis presenting as a shallow open injury with a red, pink wound bed without slough.  Wound Description (Comments): dry wound bed  Present on Admission: Yes  Dressing Type Foam - Lift dressing to assess site every shift 02/08/22 2100     Pressure Injury 02/07/22 Heel Right Stage 2 -  Partial thickness loss of dermis presenting as a shallow open injury with a red, pink wound bed without slough. (Active)  02/07/22 1845  Location: Heel  Location Orientation: Right  Staging: Stage 2 -  Partial thickness loss of dermis presenting as a shallow open injury with a red, pink wound bed without slough.  Wound Description (Comments):   Present on Admission: Yes  Dressing Type Foam - Lift dressing to assess site every shift 02/08/22 2100     Pressure Injury 02/07/22 Buttocks Right Stage 2 -  Partial thickness loss of dermis presenting as a shallow open injury with a red, pink wound bed without slough. (Active)  02/07/22 1940  Location: Buttocks  Location Orientation: Right  Staging: Stage 2 -  Partial thickness loss of dermis presenting as a shallow open injury with a red, pink wound bed without slough.  Wound Description (Comments):   Present  on Admission: Yes  Dressing Type Foam - Lift dressing to assess site every shift 02/08/22 2100     Pressure Injury 02/07/22 Pretibial Proximal;Right;Lateral Stage 2 -  Partial thickness loss of dermis presenting as a shallow open injury with a red, pink wound bed without slough. (Active)  02/07/22 1900  Location: Pretibial  Location Orientation: Proximal;Right;Lateral  Staging: Stage 2 -  Partial thickness loss of dermis presenting as a  shallow open injury with a red, pink wound bed without slough.  Wound Description (Comments):   Present on Admission: Yes  Dressing Type Foam - Lift dressing to assess site every shift 02/08/22 2100   Code status:   Code Status: Full Code   DVT Prophylaxis: apixaban (ELIQUIS) tablet 5 mg     Family Communication: None at bedside   Disposition Plan: Status is: Inpatient Remains inpatient appropriate because: Resolving sepsis physiology.  Remains on antibiotics-not yet stable for discharge.   Planned Discharge Destination:Skilled nursing facility   Diet: Diet Order             Diet regular Room service appropriate? Yes with Assist; Fluid consistency: Thin  Diet effective now                     Antimicrobial agents: Anti-infectives (From admission, onward)    Start     Dose/Rate Route Frequency Ordered Stop   02/09/22 2200  ceFEPIme (MAXIPIME) 2 g in sodium chloride 0.9 % 100 mL IVPB        2 g 200 mL/hr over 30 Minutes Intravenous Every 12 hours 02/09/22 0952     02/09/22 1800  vancomycin (VANCOREADY) IVPB 500 mg/100 mL        500 mg 100 mL/hr over 60 Minutes Intravenous Every 12 hours 02/09/22 0952     02/08/22 2200  vancomycin (VANCOCIN) capsule 125 mg  Status:  Discontinued        125 mg Oral 4 times daily 02/08/22 1948 02/08/22 1949   02/08/22 2045  vancomycin (VANCOCIN) capsule 125 mg        125 mg Oral Every 6 hours 02/08/22 1948 02/18/22 2100   02/08/22 1400  ceFEPIme (MAXIPIME) 2 g in sodium chloride 0.9 % 100 mL IVPB  Status:  Discontinued        2 g 200 mL/hr over 30 Minutes Intravenous Every 8 hours 02/08/22 1035 02/09/22 0952   02/07/22 2000  vancomycin (VANCOREADY) IVPB 750 mg/150 mL  Status:  Discontinued        750 mg 150 mL/hr over 60 Minutes Intravenous Every 12 hours 02/07/22 1932 02/09/22 0952   02/07/22 2000  piperacillin-tazobactam (ZOSYN) IVPB 3.375 g  Status:  Discontinued        3.375 g 12.5 mL/hr over 240 Minutes Intravenous Every 8  hours 02/07/22 1932 02/08/22 1035        MEDICATIONS: Scheduled Meds:  amiodarone  200 mg Oral Daily   apixaban  5 mg Oral BID   Chlorhexidine Gluconate Cloth  6 each Topical Daily   [START ON 02/10/2022] cosyntropin  0.25 mg Intravenous Once   multivitamin with minerals  1 tablet Oral Daily   mupirocin ointment  1 Application Nasal BID   nutrition supplement (JUVEN)  1 packet Oral BID BM   mouth rinse  15 mL Mouth Rinse 4 times per day   sodium chloride flush  10-40 mL Intracatheter Q12H   vancomycin  125 mg Oral Q6H   Continuous Infusions:  ceFEPime (MAXIPIME) IV  dextrose 5 % and 0.45 % NaCl with KCl 40 mEq/L 125 mL/hr at 02/08/22 2000   vancomycin     PRN Meds:.baclofen, docusate sodium, ondansetron (ZOFRAN) IV, mouth rinse, mouth rinse, polyethylene glycol, sodium chloride flush, zolpidem   I have personally reviewed following labs and imaging studies  LABORATORY DATA: CBC: Recent Labs  Lab 02/07/22 2023 02/08/22 0428  WBC 14.6* 12.2*  HGB 8.3* 7.8*  HCT 26.7* 25.0*  MCV 76.5* 77.2*  PLT 627* 548*    Basic Metabolic Panel: Recent Labs  Lab 02/07/22 2023 02/07/22 2321 02/08/22 0428 02/08/22 1610 02/09/22 0347  NA 142  --  139 138 135  K 2.4*  --  3.5 2.9* 3.5  CL 116*  --  115* 113* 113*  CO2 22  --  21* 21* 19*  GLUCOSE 147*  --  108* 88 82  BUN 10  --  9 5* 7  CREATININE 0.69  --  0.64 0.59* 0.61  CALCIUM 7.5*  --  7.2* 7.3* 7.1*  MG  --  2.3  --   --   --     GFR: Estimated Creatinine Clearance: 114.1 mL/min (by C-G formula based on SCr of 0.61 mg/dL).  Liver Function Tests: Recent Labs  Lab 02/07/22 2023  AST 19  ALT 15  ALKPHOS 148*  BILITOT 0.5  PROT 4.7*  ALBUMIN <1.5*   No results for input(s): "LIPASE", "AMYLASE" in the last 168 hours. No results for input(s): "AMMONIA" in the last 168 hours.  Coagulation Profile: No results for input(s): "INR", "PROTIME" in the last 168 hours.  Cardiac Enzymes: No results for input(s):  "CKTOTAL", "CKMB", "CKMBINDEX", "TROPONINI" in the last 168 hours.  BNP (last 3 results) No results for input(s): "PROBNP" in the last 8760 hours.  Lipid Profile: No results for input(s): "CHOL", "HDL", "LDLCALC", "TRIG", "CHOLHDL", "LDLDIRECT" in the last 72 hours.  Thyroid Function Tests: No results for input(s): "TSH", "T4TOTAL", "FREET4", "T3FREE", "THYROIDAB" in the last 72 hours.  Anemia Panel: No results for input(s): "VITAMINB12", "FOLATE", "FERRITIN", "TIBC", "IRON", "RETICCTPCT" in the last 72 hours.  Urine analysis:    Component Value Date/Time   COLORURINE STRAW (A) 02/08/2022 0428   APPEARANCEUR HAZY (A) 02/08/2022 0428   LABSPEC 1.009 02/08/2022 0428   PHURINE 5.0 02/08/2022 0428   GLUCOSEU NEGATIVE 02/08/2022 0428   HGBUR NEGATIVE 02/08/2022 0428   BILIRUBINUR NEGATIVE 02/08/2022 0428   KETONESUR NEGATIVE 02/08/2022 0428   PROTEINUR NEGATIVE 02/08/2022 0428   NITRITE NEGATIVE 02/08/2022 0428   LEUKOCYTESUR MODERATE (A) 02/08/2022 0428    Sepsis Labs: Lactic Acid, Venous    Component Value Date/Time   LATICACIDVEN 1.0 02/07/2022 2106    MICROBIOLOGY: Recent Results (from the past 240 hour(s))  Culture, blood (Routine X 2) w Reflex to ID Panel     Status: None (Preliminary result)   Collection Time: 02/07/22 11:21 PM   Specimen: BLOOD  Result Value Ref Range Status   Specimen Description BLOOD SITE NOT SPECIFIED  Final   Special Requests IN PEDIATRIC BOTTLE Blood Culture adequate volume  Final   Culture   Final    NO GROWTH 2 DAYS Performed at [facility], [address], [place], [place] [address]    Report Status PENDING  Incomplete  Remove and replace urinary cath (placed > 5 days) then obtain urine culture from new indwelling urinary catheter.     Status: Abnormal   Collection Time: 02/08/22  4:28 AM   Specimen: Urine, Catheterized  Result Value Ref Range Status   Specimen Description URINE, CATHETERIZED  Final   Special Requests   Final     NONE Performed at [facility], [address] [place], [place] [address]    Culture >=100,000 COLONIES/mL YEAST (A)  Final   Report Status 02/09/2022 FINAL  Final  C Difficile Quick Screen w PCR reflex     Status: Abnormal   Collection Time: 02/08/22 11:45 AM   Specimen: STOOL  Result Value Ref Range Status   C Diff antigen POSITIVE (A) NEGATIVE Final   C Diff toxin NEGATIVE NEGATIVE Final   C Diff interpretation Results are indeterminate. See PCR results.  Final    Comment: Performed at [facility], [place] [address], [place], [place] [address]  C. Diff by PCR, Reflexed     Status: Abnormal   Collection Time: 02/08/22 11:45 AM  Result Value Ref Range Status   Toxigenic C. Difficile by PCR POSITIVE (A) NEGATIVE Final    Comment: Positive for toxigenic C. difficile with little to no toxin production. Only treat if clinical presentation suggests symptomatic illness. CRITICAL RESULT CALLED TO, READ BACK BY AND VERIFIED WITH: 02/08/22 @ 14114 RN Thereasa Distance, J Performed at [facility], [place] [address], [place], [place] [address]   MRSA Next Gen by PCR, Nasal     Status: Abnormal   Collection Time: 02/08/22 11:54 AM   Specimen: Nasal Mucosa; Nasal Swab  Result Value Ref Range Status   MRSA by PCR Next Gen DETECTED (A) NOT DETECTED Final    Comment: RESULT CALLED TO, READ BACK BY AND VERIFIED WITH: RN Guadalupe Maple [phone] @1331  FH (NOTE) The GeneXpert MRSA Assay (FDA approved for NASAL specimens only), is one component of a comprehensive MRSA colonization surveillance program. It is not intended to diagnose MRSA infection nor to guide or monitor treatment for MRSA infections. Test performance is not FDA approved in patients less than 52 years old. Performed at [facility], [place] [address] [place], [place] [address]     RADIOLOGY STUDIES/RESULTS: DG CHEST PORT 1 VIEW  Result Date: 02/08/2022 CLINICAL DATA:  Central line placement EXAM: PORTABLE CHEST 1 VIEW  COMPARISON:  02/08/2019. FINDINGS: The right IJ central line has been removed. Left IJ central line inserted. Tip at the proximal SVC level. No large effusion or pneumothorax. Low lung volumes with minor basilar atelectasis. Normal heart size and vascularity. No focal pneumonia. Tracheostomy in the midline. Lower cervical fusion hardware partially imaged. IMPRESSION: New left IJ central line tip proximal SVC level. No complicating feature. Electronically Signed   By: Jerilynn Mages.  Shick M.D.   On: 02/08/2022 15:50   ECHOCARDIOGRAM COMPLETE  Result Date: 02/08/2022    ECHOCARDIOGRAM REPORT   Patient Name:   Luke Wright Date of Exam: 02/08/2022 Medical Rec #:  [id number]       Height:       70.9 in Accession #:    [id number]      Weight:       170.2 lb Date of Birth:  [birth year]       BSA:          1.966 m Patient Age:    50 years        BP:           118/55 mmHg Patient Gender: M               HR:  78 bpm. Exam Location:  Inpatient Procedure: 2D Echo, Cardiac Doppler and Color Doppler Indications:     [facility]) [id number]  History:         Patient has no prior history of Echocardiogram examinations.                  Stroke; Signs/Symptoms:Hypotension. Quadriplegia trach/PEG but                  not vent dependent. Episode of V. tach noted at outside                  hospital.  Sonographer:     Darlina Sicilian RDCS Referring Phys:  [id number] [facility] Diagnosing Phys: [place] IMPRESSIONS  1. Akinesis of the apex. Left ventricular ejection fraction, by estimation, is 30 to 35%. The left ventricle has moderately decreased function. The left ventricle demonstrates global hypokinesis. Left ventricular diastolic parameters are consistent with  Grade II diastolic dysfunction (pseudonormalization).  2. Right ventricular systolic function is normal. The right ventricular size is normal.  3. A small pericardial effusion is present.  4. The mitral valve is grossly normal. No evidence of mitral valve regurgitation.   5. Aortic valve regurgitation is not visualized.  6. Cannot visualize IVC. Comparison(s): No prior Echocardiogram. Conclusion(s)/Recommendation(s): Notable swirling contrast at the apex; cannot definitively rule out LV thrombus; recommend cardiac MRI. FINDINGS  Left Ventricle: Akinesis of the apex. Left ventricular ejection fraction, by estimation, is 30 to 35%. The left ventricle has moderately decreased function. The left ventricle demonstrates global hypokinesis. The left ventricular internal cavity size was normal in size. There is no left ventricular hypertrophy. Left ventricular diastolic parameters are consistent with Grade II diastolic dysfunction (pseudonormalization). Right Ventricle: The right ventricular size is normal. Right ventricular systolic function is normal. Left Atrium: Left atrial size was normal in size. Right Atrium: Right atrial size was normal in size. Pericardium: A small pericardial effusion is present. Mitral Valve: The mitral valve is grossly normal. No evidence of mitral valve regurgitation. Tricuspid Valve: The tricuspid valve is normal in structure. Tricuspid valve regurgitation is trivial. Aortic Valve: Aortic valve regurgitation is not visualized. Pulmonic Valve: The pulmonic valve was not well visualized. Aorta: The aortic root is normal in size and structure. Venous: Cannot visualize IVC.  LEFT VENTRICLE PLAX 2D LVIDd:         4.00 cm      Diastology LVIDs:         2.85 cm      LV e' medial:    5.70 cm/s LV PW:         1.00 cm      LV E/e' medial:  10.5 LV IVS:        0.90 cm      LV e' lateral:   8.33 cm/s LVOT diam:     2.20 cm      LV E/e' lateral: 7.2 LV SV:         43 LV SV Index:   22 LVOT Area:     3.80 cm  LV Volumes (MOD) LV vol d, MOD A2C: 160.0 ml LV vol d, MOD A4C: 182.0 ml LV vol s, MOD A2C: 99.4 ml LV vol s, MOD A4C: 124.0 ml LV SV MOD A2C:     60.6 ml LV SV MOD A4C:     182.0 ml LV SV MOD BP:      56.1 ml RIGHT VENTRICLE RV S prime:  10.70 cm/s TAPSE (M-mode):  1.4 cm LEFT ATRIUM             Index        RIGHT ATRIUM           Index LA diam:        2.70 cm 1.37 cm/m   RA Area:     11.00 cm LA Vol (A2C):   22.6 ml 11.47 ml/m  RA Volume:   19.80 ml  10.07 ml/m LA Vol (A4C):   38.5 ml 19.58 ml/m LA Biplane Vol: 34.6 ml 17.60 ml/m  AORTIC VALVE LVOT Vmax:   55.40 cm/s LVOT Vmean:  36.500 cm/s LVOT VTI:    0.114 m  AORTA Ao Root diam: 3.40 cm MITRAL VALVE               TRICUSPID VALVE MV Area (PHT): 3.77 cm    TR Peak grad:   14.9 mmHg MV Decel Time: 201 msec    TR Vmax:        193.00 cm/s MV E velocity: 60.00 cm/s MV A velocity: 44.10 cm/s  SHUNTS MV E/A ratio:  1.36        Systemic VTI:  0.11 m                            Systemic Diam: 2.20 cm Carolan Clines Electronically signed by Carolan Clines Signature Date/Time: 02/08/2022/10:41:38 AM    Final (Updated)    DG Chest Port 1 View  Result Date: 02/07/2022 CLINICAL DATA:  Possible pneumothorax EXAM: PORTABLE CHEST 1 VIEW COMPARISON:  02/07/2022 at 1904 hours FINDINGS: Skin fold overlying the right upper hemithorax. Pulmonary markings extend beyond this fold, indicating that this does not reflect pneumothorax. Additional skin fold at the left lung apex. The lungs are essentially clear. No pleural effusion or pneumothorax. Tracheostomy in satisfactory position. The heart is normal in size. Right IJ venous catheter terminates in the lower SVC. Cervical spine fixation hardware, incompletely visualized. IMPRESSION: No pneumothorax is seen.  Skin folds at the bilateral lung apices. These results will be called to the ordering clinician or representative by the Radiologist Assistant, and communication documented in the PACS or [employer]. Electronically Signed   By: Charline Bills M.D.   On: 02/07/2022 23:45   DG Chest Port 1 View  Result Date: 02/07/2022 CLINICAL DATA:  Tracheostomy EXAM: PORTABLE CHEST 1 VIEW COMPARISON:  05/20/2021 FINDINGS: Tracheostomy tube tip about 6.3 cm superior to carina. Hardware in the  cervical spine. Atelectasis or scarring in the right perihilar lung. No consolidation or pleural effusion. Possible skin fold artifact at the right apex. Right IJ central venous catheter tip over the SVC. IMPRESSION: 1. Tracheostomy tube with tip about 6.3 cm superior to carina 2. Lucency at the right apex is indeterminate for skin fold artifact versus small pneumothorax, radiographic follow-up is recommended. These results will be called to the ordering clinician or representative by the Radiologist Assistant, and communication documented in the PACS or [employer]. Electronically Signed   By: Jasmine Pang M.D.   On: 02/07/2022 19:15     LOS: 2 days   Jeoffrey Massed, MD  [facility]    To contact the attending provider between 7A-7P or the covering provider during after hours 7P-7A, please log into the web site [email or web address] and access using universal [place] password for that web site. If you do not have the password, please call the hospital operator.  02/09/2022, 10:36 AM

## 2022-02-09 NOTE — Progress Notes (Signed)
Pharmacy Antibiotic Note  Luke Wright is a 51 y.o. quadriplegic male admitted on 02/07/2022 with sepsis.  Pt transferred from [facility] ED with concerns for sepsis. Source either UTI, bacteremia/PICC or wound infection. Pharmacy has been consulted for cefepime and vancomycin dosing.  Pt has been receiving antibiotics at SNF prior to ED Visit.   WBC 112.2, Scr 0.61 CrCl ~110 ml/min, likely falsely elevated due to quadriplegia  Vanc Trough: 25 ug/mL drawn appropriately on Vanc 750mg  Q12h Renal function difficult to accurately assess due to low muscle definition with quadriplegia. Assumed diminished renal function based on resulting high vancomycin trough, therefore will reduce cefepime as well.   Plan: Reduce vancomycin dose to 500mg  IV q12h      > Goal AUC 400-550     > Check vancomycin levels on 7/31 Reduce Cefepime to 2g Q12h   Monitor daily WBC, temp, SCr, and clinical s/sx of infection   Height: 5\' 10"  (177.8 cm) Weight: 83.9 kg (184 lb 15.5 oz) IBW/kg (Calculated) : 73  Temp (24hrs), Avg:97.6 F (36.4 C), Min:96.1 F (35.6 C), Max:98.4 F (36.9 C)  Recent Labs  Lab 02/07/22 2023 02/07/22 2106 02/08/22 0428 02/08/22 1610 02/09/22 0347 02/09/22 0751  WBC 14.6*  --  12.2*  --   --   --   CREATININE 0.69  --  0.64 0.59* 0.61  --   LATICACIDVEN  --  1.0  --   --   --   --   VANCOTROUGH  --   --   --   --   --  25*     Estimated Creatinine Clearance: 114.1 mL/min (by C-G formula based on SCr of 0.61 mg/dL).    No Known Allergies  Antimicrobials this admission: Cefepime 7/28 >> Vanc 7/27 >> Zosyn 7/27 >> 7/28   PTA: Doxycycline 100mg  PO BID 7/14 > 7/20 Zosyn 3.375g IV q6h 7/19 > 7/27 (last dose 0600 7/27) Vancomycin 1000mg  IV q12h 7/19 > 7/24 AM Vancomycin 750mg  IV q12h 7/24 PM > 7/26 (last dose 2000 7/26)  Dose adjustments this admission: 7/29 Vancomycin 750mg  IV Q12 (Tr 25) > Vancomycin 500mg  Q12 7/29 Cefepime 2g Q8h > Cefepime 2g Q12h  Microbiology  results: 7/28 C. Diff PCR: Positive  7/28 MRSA Nares: Positive 7/27 Bcx x2: ngtd   8/26, PharmD PGY1 Pharmacy Resident 02/09/2022 9:50 AM
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
## 2022-02-09 NOTE — Progress Notes (Signed)
Critical lab reported from lab Vancomycin trough 25. Notified MD Ghimire, and 5W Pharmacist.

## 2022-02-10 DIAGNOSIS — R6521 Severe sepsis with septic shock: Secondary | ICD-10-CM | POA: Diagnosis not present

## 2022-02-10 DIAGNOSIS — A419 Sepsis, unspecified organism: Secondary | ICD-10-CM | POA: Diagnosis not present

## 2022-02-10 LAB — CBC
HCT: 22.6 % — ABNORMAL LOW (ref 39.0–52.0)
Hemoglobin: 7.2 g/dL — ABNORMAL LOW (ref 13.0–17.0)
MCH: 24.6 pg — ABNORMAL LOW (ref 26.0–34.0)
MCHC: 31.9 g/dL (ref 30.0–36.0)
MCV: 77.1 fL — ABNORMAL LOW (ref 80.0–100.0)
Platelets: 420 10*3/uL — ABNORMAL HIGH (ref 150–400)
RBC: 2.93 MIL/uL — ABNORMAL LOW (ref 4.22–5.81)
RDW: 20.8 % — ABNORMAL HIGH (ref 11.5–15.5)
WBC: 10.7 10*3/uL — ABNORMAL HIGH (ref 4.0–10.5)
nRBC: 0 % (ref 0.0–0.2)

## 2022-02-10 LAB — ABO/RH: ABO/RH(D): B NEG

## 2022-02-10 LAB — MAGNESIUM: Magnesium: 2.1 mg/dL (ref 1.7–2.4)

## 2022-02-10 LAB — ACTH STIMULATION, 3 TIME POINTS
Cortisol, 30 Min: 18.9 ug/dL
Cortisol, 60 Min: 25.1 ug/dL
Cortisol, Base: 10 ug/dL

## 2022-02-10 LAB — PREPARE RBC (CROSSMATCH)

## 2022-02-10 MED ORDER — DIPHENHYDRAMINE HCL 50 MG/ML IJ SOLN
25.0000 mg | Freq: Four times a day (QID) | INTRAMUSCULAR | Status: DC | PRN
Start: 2022-02-10 — End: 2022-02-13

## 2022-02-10 MED ORDER — SODIUM CHLORIDE 0.9% IV SOLUTION: Freq: Once | INTRAVENOUS | Status: AC

## 2022-02-10 MED ORDER — POTASSIUM CHLORIDE 20 MEQ PO PACK
40.0000 meq | PACK | Freq: Once | ORAL | Status: AC
  Administered 2022-02-10: 40 meq via ORAL
  Filled 2022-02-10 (×2): qty 2

## 2022-02-10 NOTE — Progress Notes (Signed)
   No new complaints. Trach collar in place.  Hypotension noted blood pressure 89/61 at last check.  Hemoglobin 7.2, remains anemic.  Currently on Eliquis for DVT/PE treatment   No new recommendations at the time.  Please see consult note from 7/29.  Unable to utilize traditional goal-directed medical therapy for reduced ejection fraction given his hypotension.  Hopefully reduced EF is secondary to underlying sepsis and will improve with time.  Also, amiodarone was discontinued yesterday.  No further evidence of ventricular tachycardia.  VT was seen in the midst of severe sepsis with hypokalemia. Occasional PVC's on telemetry.   Small pericardial effusion should be of no significance clinically.  In the future, repeat echocardiogram will be helpful to reassess overall ejection fraction.  If remains reduced, could consider further ischemic evaluation at that time.  Elevated troponin with new reduced ejection fraction likely secondary to underlying sepsis, hypotension, oxygen supply demand mismatch-type II myocardial infarction.  Continue with medical management.  Increases his overall morbidity.   Will sign off. Please call with any questions.   Donato Schultz, MD

## 2022-02-10 NOTE — Plan of Care (Signed)

## 2022-02-10 NOTE — Progress Notes (Signed)
PROGRESS NOTE        PATIENT DETAILS Name: Luke Wright Age: 51 y.o. Sex: male Date of Birth: 1970/09/12 Admit Date: 02/07/2022 Admitting Physician Cheri Fowler, MD [id number], Provider, MD  Brief Summary: Patient is a 51 y.o.  male with history of quadriplegia-stage IV sacral decubitus-trach-resident of SNF-who presented to [facility] ICU from [facility] management of septic shock, V. tach (in a setting of hypokalemia).  Patient was stabilized with IV antibiotics-started on amiodarone-and subsequently transferred to [facility] service on 7/29.  Significant events: 7/27>> transfer to [facility] ICU from [facility]-septic shock-V. tach (hypokalemia).  PICC/Foley in place on arrival. 7/29>> transfer to [facility] service.  Significant studies: 7/27>> CXR: Lucency at right apex-artifact versus small pneumothorax. 7/27>> CXR: No pneumothorax.  Skinfolds at bilateral lung apices. 7/28>> Echo: EF 30-35%, global hypokinesis, grade 2 diastolic dysfunction.  Small pericardial effusion.  Significant microbiology data: 7/27>> blood culture: Negative 7/28>> stool C. difficile: Antigen positive/toxin negative. 7/28>> urine culture (catheterized): 100,000 colonies/mL yeast (likely contamination)  Procedures: 7/28>> nontunneled IJ catheter placed by PCCM.  Consults: PCCM Cardiology  Subjective: Patient in bed, appears comfortable, denies any headache, no fever, no chest pain or pressure, no shortness of breath , no abdominal pain. No focal weakness.  Objective: Vitals: Blood pressure (!) 89/61, pulse 85, temperature 97.6 F (36.4 C), temperature source Oral, resp. rate 18, height 5\' 10"  (1.778 m), weight (!) 184.4 kg, SpO2 96 %.   Exam:  Awake Alert, baseline quadriplegia with severe upper extremity contractures, bilateral lower extremities in the surgical boots, tracheostomy in place, Foley catheter in place, left subclavian central line triple-lumen placed  02/08/2022 [place].AT,PERRAL Supple Neck, No JVD,   Symmetrical Chest wall movement, Good air movement bilaterally, CTAB RRR,No Gallops, Rubs or new Murmurs,  +ve B.Sounds, Abd Soft, No tenderness,   No Cyanosis, Clubbing or edema    Assessment/Plan:  Septic shock: Multiple etiologies including complicated UTI/possible C. difficile colitis-sepsis physiology improved-continue cefepime/oral vancomycin.  Since blood cultures negative-stop IV vancomycin.  Cosyntropin stimulation test unremarkable.  Sepsis pathophysiology resolving.  Possible C. difficile colitis: Has ongoing diarrhea-given numerous hospitalizations/antibiotics exposure-reasonable to treat with oral vancomycin.  Anemia of chronic disease with acute drop due to acute illness and multiple blood draws.  Transfuse 1 unit on 02/10/2022, will also augment blood pressure.  No signs of ongoing active bleeding.  Ventricular tachycardia: Maintaining sinus rhythm-continue amiodarone-Per chart review this was in the setting of hypokalemia.  However has significant LV dysfunction.  Cardiology consulted this morning.  Keep K >4, Mg >2.  Newly diagnosed HFrEF: Volume status stable/compensated-doubt any utility in proceeding with ischemic evaluation given patient's overall clinical situation.  Await further input from cardiology.  Elevated troponin: Likely due to demand ischemia-however has significant LV dysfunction-await cardiology input.  Do not think he is a good candidate for ischemic evaluation.  Suspect better to manage medically as much as possible  Small pericardial effusion: Incidental finding on echo-repeat echo in a few weeks.  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  Hypokalemia: Replaced.  History of sacral osteomyelitis/stage IV sacral decubitus ulcer/bilateral leg ulcers: All present prior to hospitalization-continue per wound care.  History of traumatic quadriplegia-sp /trach-SNF resident  Nutrition Status: Nutrition Problem: Increased  nutrient needs Etiology: wound healing Signs/Symptoms: estimated needs Interventions: MVI, Juven  Pressure Ulcer: Pressure Injury 02/07/22 Coccyx Posterior Stage 4 - Full thickness tissue loss  with exposed bone, tendon or muscle. large stage 3 with undermining all around; no drainage, (Active)  02/07/22 1830  Location: Coccyx  Location Orientation: Posterior  Staging: Stage 4 - Full thickness tissue loss with exposed bone, tendon or muscle.  Wound Description (Comments): large stage 3 with undermining all around; no drainage,  Present on Admission:   Dressing Type Foam - Lift dressing to assess site every shift;ABD 02/09/22 2313     Pressure Injury 02/07/22 Tibial Right;Lateral Stage 2 -  Partial thickness loss of dermis presenting as a shallow open injury with a red, pink wound bed without slough. (Active)  02/07/22 1845  Location: Tibial  Location Orientation: Right;Lateral  Staging: Stage 2 -  Partial thickness loss of dermis presenting as a shallow open injury with a red, pink wound bed without slough.  Wound Description (Comments):   Present on Admission: Yes  Dressing Type Foam - Lift dressing to assess site every shift 02/09/22 2313     Pressure Injury 02/07/22 Tibial Left;Lateral Unstageable - Full thickness tissue loss in which the base of the injury is covered by slough (yellow, tan, gray, green or brown) and/or eschar (tan, brown or black) in the wound bed. (Active)  02/07/22 1845  Location: Tibial  Location Orientation: Left;Lateral  Staging: Unstageable - Full thickness tissue loss in which the base of the injury is covered by slough (yellow, tan, gray, green or brown) and/or eschar (tan, brown or black) in the wound bed.  Wound Description (Comments):   Present on Admission: Yes  Dressing Type Foam - Lift dressing to assess site every shift 02/09/22 2313     Pressure Injury 02/07/22 Tibial Left;Proximal;Lateral Stage 2 -  Partial thickness loss of dermis presenting as a  shallow open injury with a red, pink wound bed without slough. dry wound bed (Active)  02/07/22 1845  Location: Tibial  Location Orientation: Left;Proximal;Lateral  Staging: Stage 2 -  Partial thickness loss of dermis presenting as a shallow open injury with a red, pink wound bed without slough.  Wound Description (Comments): dry wound bed  Present on Admission: Yes  Dressing Type Foam - Lift dressing to assess site every shift 02/09/22 2313     Pressure Injury 02/07/22 Heel Right Stage 2 -  Partial thickness loss of dermis presenting as a shallow open injury with a red, pink wound bed without slough. (Active)  02/07/22 1845  Location: Heel  Location Orientation: Right  Staging: Stage 2 -  Partial thickness loss of dermis presenting as a shallow open injury with a red, pink wound bed without slough.  Wound Description (Comments):   Present on Admission: Yes  Dressing Type Foam - Lift dressing to assess site every shift 02/09/22 2313     Pressure Injury 02/07/22 Buttocks Right Stage 2 -  Partial thickness loss of dermis presenting as a shallow open injury with a red, pink wound bed without slough. (Active)  02/07/22 1940  Location: Buttocks  Location Orientation: Right  Staging: Stage 2 -  Partial thickness loss of dermis presenting as a shallow open injury with a red, pink wound bed without slough.  Wound Description (Comments):   Present on Admission: Yes  Dressing Type Foam - Lift dressing to assess site every shift 02/09/22 2313     Pressure Injury 02/07/22 Pretibial Proximal;Right;Lateral Stage 2 -  Partial thickness loss of dermis presenting as a shallow open injury with a red, pink wound bed without slough. (Active)  02/07/22 1900  Location: Pretibial  Location  Orientation: Proximal;Right;Lateral  Staging: Stage 2 -  Partial thickness loss of dermis presenting as a shallow open injury with a red, pink wound bed without slough.  Wound Description (Comments):   Present on  Admission: Yes  Dressing Type Foam - Lift dressing to assess site every shift 02/09/22 2313   Code status:   Code Status: Full Code   DVT Prophylaxis: apixaban (ELIQUIS) tablet 5 mg     Family Communication: None at bedside   Disposition Plan: Status is: Inpatient Remains inpatient appropriate because: Resolving sepsis physiology.  Remains on antibiotics-not yet stable for discharge.   Planned Discharge Destination:Skilled nursing facility   Diet: Diet Order             Diet regular Room service appropriate? Yes with Assist; Fluid consistency: Thin  Diet effective now                   MEDICATIONS: Scheduled Meds:  sodium chloride   Intravenous Once   apixaban  5 mg Oral BID   Chlorhexidine Gluconate Cloth  6 each Topical Daily   multivitamin with minerals  1 tablet Oral Daily   mupirocin ointment  1 Application Nasal BID   nutrition supplement (JUVEN)  1 packet Oral BID BM   mouth rinse  15 mL Mouth Rinse 4 times per day   sodium chloride flush  10-40 mL Intracatheter Q12H   vancomycin  125 mg Oral Q6H   Continuous Infusions:  ceFEPime (MAXIPIME) IV 2 g (02/10/22 0956)   PRN Meds:.baclofen, diphenhydrAMINE, docusate sodium, ondansetron (ZOFRAN) IV, mouth rinse, mouth rinse, polyethylene glycol, sodium chloride flush, zolpidem   I have personally reviewed following labs and imaging studies  LABORATORY DATA:  Recent Labs  Lab 02/07/22 2023 02/08/22 0428 02/10/22 0357  WBC 14.6* 12.2* 10.7*  HGB 8.3* 7.8* 7.2*  HCT 26.7* 25.0* 22.6*  PLT 627* 548* 420*  MCV 76.5* 77.2* 77.1*  MCH 23.8* 24.1* 24.6*  MCHC 31.1 31.2 31.9  RDW 20.6* 20.7* 20.8*    Recent Labs  Lab 02/07/22 2023 02/07/22 2106 02/07/22 2321 02/08/22 0428 02/08/22 1610 02/09/22 0347 02/09/22 0721 02/10/22 0357  NA 142  --   --  139 138 135  --   --   K 2.4*  --   --  3.5 2.9* 3.5  --   --   CL 116*  --   --  115* 113* 113*  --   --   CO2 22  --   --  21* 21* 19*  --   --    GLUCOSE 147*  --   --  108* 88 82  --   --   BUN 10  --   --  9 5* 7  --   --   CREATININE 0.69  --   --  0.64 0.59* 0.61  --   --   CALCIUM 7.5*  --   --  7.2* 7.3* 7.1*  --   --   AST 19  --   --   --   --   --   --   --   ALT 15  --   --   --   --   --   --   --   ALKPHOS 148*  --   --   --   --   --   --   --   BILITOT 0.5  --   --   --   --   --   --   --  ALBUMIN <1.5*  --   --   --   --   --   --   --   MG  --   --  2.3  --   --   --  1.8 2.1  PROCALCITON <0.10  --   --   --   --   --   --   --   LATICACIDVEN  --  1.0  --   --   --   --   --   --          RADIOLOGY STUDIES/RESULTS: DG CHEST PORT 1 VIEW  Result Date: 02/08/2022 CLINICAL DATA:  Central line placement EXAM: PORTABLE CHEST 1 VIEW COMPARISON:  02/08/2019. FINDINGS: The right IJ central line has been removed. Left IJ central line inserted. Tip at the proximal SVC level. No large effusion or pneumothorax. Low lung volumes with minor basilar atelectasis. Normal heart size and vascularity. No focal pneumonia. Tracheostomy in the midline. Lower cervical fusion hardware partially imaged. IMPRESSION: New left IJ central line tip proximal SVC level. No complicating feature. Electronically Signed   By: Jerilynn Mages.  Shick M.D.   On: 02/08/2022 15:50     LOS: 3 days   ;Signature  Lala Lund M.D on 02/10/2022 at 11:20 AM   -  To page go to [email or web address]
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
## 2022-02-11 DIAGNOSIS — A419 Sepsis, unspecified organism: Secondary | ICD-10-CM | POA: Diagnosis not present

## 2022-02-11 DIAGNOSIS — R6521 Severe sepsis with septic shock: Secondary | ICD-10-CM | POA: Diagnosis not present

## 2022-02-11 LAB — CBC WITH DIFFERENTIAL/PLATELET
Abs Immature Granulocytes: 0.04 10*3/uL (ref 0.00–0.07)
Basophils Absolute: 0.1 10*3/uL (ref 0.0–0.1)
Basophils Relative: 1 %
Eosinophils Absolute: 0.3 10*3/uL (ref 0.0–0.5)
Eosinophils Relative: 3 %
HCT: 26.1 % — ABNORMAL LOW (ref 39.0–52.0)
Hemoglobin: 8.3 g/dL — ABNORMAL LOW (ref 13.0–17.0)
Immature Granulocytes: 0 %
Lymphocytes Relative: 32 %
Lymphs Abs: 3.3 10*3/uL (ref 0.7–4.0)
MCH: 25.2 pg — ABNORMAL LOW (ref 26.0–34.0)
MCHC: 31.8 g/dL (ref 30.0–36.0)
MCV: 79.1 fL — ABNORMAL LOW (ref 80.0–100.0)
Monocytes Absolute: 0.8 10*3/uL (ref 0.1–1.0)
Monocytes Relative: 8 %
Neutro Abs: 5.7 10*3/uL (ref 1.7–7.7)
Neutrophils Relative %: 56 %
Platelets: 446 10*3/uL — ABNORMAL HIGH (ref 150–400)
RBC: 3.3 MIL/uL — ABNORMAL LOW (ref 4.22–5.81)
RDW: 20.6 % — ABNORMAL HIGH (ref 11.5–15.5)
WBC: 10.2 10*3/uL (ref 4.0–10.5)
nRBC: 0 % (ref 0.0–0.2)

## 2022-02-11 LAB — TYPE AND SCREEN
ABO/RH(D): B NEG
Antibody Screen: NEGATIVE
Unit division: 0

## 2022-02-11 LAB — COMPREHENSIVE METABOLIC PANEL
ALT: 19 U/L (ref 0–44)
AST: 18 U/L (ref 15–41)
Albumin: <1.5 g/dL — ABNORMAL LOW (ref 3.5–5.0)
Alkaline Phosphatase: 135 U/L — ABNORMAL HIGH (ref 38–126)
Anion gap: 5 (ref 5–15)
BUN: 12 mg/dL (ref 6–20)
CO2: 26 mmol/L (ref 22–32)
Calcium: 7.4 mg/dL — ABNORMAL LOW (ref 8.9–10.3)
Chloride: 108 mmol/L (ref 98–111)
Creatinine, Ser: 0.39 mg/dL — ABNORMAL LOW (ref 0.61–1.24)
GFR, Estimated: >60 mL/min (ref >60)
Glucose, Bld: 79 mg/dL (ref 70–99)
Potassium: 2.8 mmol/L — ABNORMAL LOW (ref 3.5–5.1)
Sodium: 139 mmol/L (ref 135–145)
Total Bilirubin: 0.3 mg/dL (ref 0.3–1.2)
Total Protein: 4.5 g/dL — ABNORMAL LOW (ref 6.5–8.1)

## 2022-02-11 LAB — BPAM RBC
Blood Product Expiration Date: 202308142359
ISSUE DATE / TIME: 202307301445
Unit Type and Rh: 1700

## 2022-02-11 LAB — C-REACTIVE PROTEIN: CRP: 4.4 mg/dL — ABNORMAL HIGH (ref <1.0)

## 2022-02-11 LAB — MAGNESIUM: Magnesium: 1.9 mg/dL (ref 1.7–2.4)

## 2022-02-11 LAB — POTASSIUM: Potassium: 3.3 mmol/L — ABNORMAL LOW (ref 3.5–5.1)

## 2022-02-11 LAB — BRAIN NATRIURETIC PEPTIDE: B Natriuretic Peptide: 584.1 pg/mL — ABNORMAL HIGH (ref 0.0–100.0)

## 2022-02-11 MED ORDER — POTASSIUM CHLORIDE 10 MEQ/100ML IV SOLN: 10.0000 meq | INTRAVENOUS | Status: DC

## 2022-02-11 MED ORDER — POTASSIUM CHLORIDE CRYS ER 20 MEQ PO TBCR
40.0000 meq | EXTENDED_RELEASE_TABLET | Freq: Once | ORAL | Status: AC
  Administered 2022-02-11: 40 meq via ORAL
  Filled 2022-02-11: qty 2

## 2022-02-11 MED ORDER — MAGNESIUM SULFATE 2 GM/50ML IV SOLN
2.0000 g | Freq: Once | INTRAVENOUS | Status: AC
  Administered 2022-02-11: 2 g via INTRAVENOUS
  Filled 2022-02-11: qty 50

## 2022-02-11 MED ORDER — POTASSIUM CHLORIDE 20 MEQ PO PACK
40.0000 meq | PACK | Freq: Once | ORAL | Status: AC
Start: 2022-02-11 — End: 2022-02-11
  Administered 2022-02-11: 40 meq via ORAL
  Filled 2022-02-11: qty 2

## 2022-02-11 MED ORDER — POTASSIUM CHLORIDE 2 MEQ/ML IV SOLN
INTRAVENOUS | Status: AC
  Filled 2022-02-11 (×2): qty 1000

## 2022-02-11 MED ORDER — CEFEPIME HCL 2 G IV SOLR
2.0000 g | Freq: Two times a day (BID) | INTRAVENOUS | Status: AC
  Administered 2022-02-11 – 2022-02-12 (×4): 2 g via INTRAVENOUS
  Filled 2022-02-11 (×4): qty 12.5

## 2022-02-11 MED ORDER — SODIUM CHLORIDE 0.9 % IV SOLN
2.0000 g | Freq: Three times a day (TID) | INTRAVENOUS | Status: DC
Start: 2022-02-11 — End: 2022-02-11

## 2022-02-11 NOTE — NC FL2 (Cosign Needed Addendum)
[place] MEDICAID FL2 LEVEL OF CARE SCREENING TOOL     IDENTIFICATION  Patient Name: Luke Wright Birthdate: Aug 25, 1970 Sex: male Admission Date (Current Location): 02/07/2022  [facility] and [place] Number:  Producer, television/film/video and Address:  [facility], [address] [place], [place] [address]      Provider Number: [id number]  Attending Physician Name and Address:  Leroy Sea, MD  Relative Name and Phone Number:       Current Level of Care: Hospital Recommended Level of Care: Skilled Nursing Facility Prior Approval Number:    Date Approved/Denied:   PASRR Number: [id number]  Discharge Plan: SNF    Current Diagnoses: Patient Active Problem List   Diagnosis Date Noted   Pressure injury of skin 02/08/2022   Septic shock (HCC) 02/07/2022   Neurogenic bladder 05/25/2021   Chronic pain syndrome 05/24/2021   Neuropathy 05/24/2021   Tracheostomy care (HCC)    Dysphagia    Hypoalbuminemia due to protein-calorie malnutrition (HCC)    Neurogenic bowel    Acute blood loss anemia    Essential hypertension    Quadriplegia, unspecified (HCC) 04/23/2021   Pressure injury of sacral region, unstageable (HCC) 04/23/2021    Orientation RESPIRATION BLADDER Height & Weight     Self, Time, Situation, Place  Tracheostomy (21%FiO2 [place] 51mm uncuffed w/5L O2) Incontinent, Indwelling catheter Weight: 185lbs Height:  5\' 10"  (177.8 cm)  BEHAVIORAL SYMPTOMS/MOOD NEUROLOGICAL BOWEL NUTRITION STATUS      Incontinent (Fecal management) Diet (See dc summary, regular)  AMBULATORY STATUS COMMUNICATION OF NEEDS Skin   Extensive Assist Verbally Other (Comment), PU Stage and Appropriate Care (Stage IV on coccyx, Stage II on tibia;unstageable on tibia;Stage II on heel and buttocks;)                       Personal Care Assistance Level of Assistance  Bathing, Feeding, Dressing Bathing Assistance: Maximum assistance Feeding assistance: Maximum  assistance Dressing Assistance: Maximum assistance     Functional Limitations Info             SPECIAL CARE FACTORS FREQUENCY                       Contractures Contractures Info: Not present    Additional Factors Info  Isolation Precautions, Code Status, Allergies Code Status Info: Full Allergies Info: NKA     Isolation Precautions Info: C Diff     Current Medications (02/11/2022):  This is the current hospital active medication list Current Facility-Administered Medications  Medication Dose Route Frequency Provider Last Rate Last Admin   apixaban (ELIQUIS) tablet 5 mg  5 mg Oral BID 02/13/2022 B, MD   5 mg at 02/11/22 0951   baclofen (LIORESAL) tablet 40 mg  40 mg Oral Q8H PRN Nallamothu, Ravindra N, MD   40 mg at 02/10/22 1721   ceFEPIme (MAXIPIME) 2 g in sodium chloride 0.9 % 100 mL IVPB  2 g Intravenous Q12H Pierce, Dwayne A, RPH 200 mL/hr at 02/11/22 0959 2 g at 02/11/22 0959   Chlorhexidine Gluconate Cloth 2 % PADS 6 each  6 each Topical Daily Mannam, Praveen, MD   6 each at 02/11/22 1002   diphenhydrAMINE (BENADRYL) injection 25 mg  25 mg Intravenous Q6H PRN 02/13/22, MD       docusate sodium (COLACE) capsule 100 mg  100 mg Oral BID PRN Bowser, Leroy Sea, NP  lactated ringers 1,000 mL with potassium chloride 40 mEq infusion   Intravenous Continuous Leroy Sea, MD 100 mL/hr at 02/11/22 1232 New Bag at 02/11/22 1232   multivitamin with minerals tablet 1 tablet  1 tablet Oral Daily Lupita Leash, MD   1 tablet at 02/11/22 0951   mupirocin ointment (BACTROBAN) 2 % 1 Application  1 Application Nasal BID Lupita Leash, MD   1 Application at 02/11/22 0454   nutrition supplement (JUVEN) (JUVEN) powder packet 1 packet  1 packet Oral BID BM Max Fickle B, MD   1 packet at 02/11/22 1635   ondansetron (ZOFRAN) injection 4 mg  4 mg Intravenous Q6H PRN Bowser, Kaylyn Layer, NP       Oral care mouth rinse  15 mL Mouth Rinse PRN Mannam, Praveen,  MD       Oral care mouth rinse  15 mL Mouth Rinse 4 times per day Max Fickle B, MD   15 mL at 02/11/22 1635   Oral care mouth rinse  15 mL Mouth Rinse PRN Lupita Leash, MD       polyethylene glycol (MIRALAX / GLYCOLAX) packet 17 g  17 g Oral Daily PRN Bowser, Kaylyn Layer, NP       sodium chloride flush (NS) 0.9 % injection 10-40 mL  10-40 mL Intracatheter Q12H Mannam, Praveen, MD   10 mL at 02/11/22 1020   vancomycin (VANCOCIN) capsule 125 mg  125 mg Oral Q6H Nallamothu, Ravindra N, MD   125 mg at 02/11/22 1634   zolpidem (AMBIEN) tablet 5 mg  5 mg Oral QHS PRN Nallamothu, Idell Pickles, MD   5 mg at 02/10/22 2207     Discharge Medications: Please see discharge summary for a list of discharge medications.  Relevant Imaging Results:  Relevant Lab Results:   Additional Information SSN: [id number]  Mearl Latin, LCSW

## 2022-02-11 NOTE — Progress Notes (Signed)
Wound care offered to pt at this time. Pt refused at this time.

## 2022-02-11 NOTE — Progress Notes (Signed)
Air mattress bed rotates and causes pillow behind head to cover patient nose and mouth. RN offered rolled towel for neck support. Pt refused. RN provided education on the dangers of airway being covered. Pt insists on having pillow. Charge, RN and unit management notified.

## 2022-02-11 NOTE — Progress Notes (Signed)
PROGRESS NOTE        PATIENT DETAILS Name: Luke Wright Age: 51 y.o. Sex: male Date of Birth: 1970/08/14 Admit Date: 02/07/2022 Admitting Physician Cheri Fowler, MD RUE:[id number], Provider, MD  Brief Summary: Patient is a 51 y.o.  male with history of quadriplegia-stage IV sacral decubitus-trach-resident of SNF-who presented to [facility] ICU from [facility] management of septic shock, V. tach (in a setting of hypokalemia).  Patient was stabilized with IV antibiotics-started on amiodarone-and subsequently transferred to [facility] service on 7/29.  Significant events: 7/27>> transfer to [facility] ICU from [facility]-septic shock-V. tach (hypokalemia).  PICC/Foley in place on arrival. 7/29>> transfer to [facility] service.  Significant studies: 7/27>> CXR: Lucency at right apex-artifact versus small pneumothorax. 7/27>> CXR: No pneumothorax.  Skinfolds at bilateral lung apices. 7/28>> Echo: EF 30-35%, global hypokinesis, grade 2 diastolic dysfunction.  Small pericardial effusion.  Significant microbiology data: 7/27>> blood culture: Negative 7/28>> stool C. difficile: Antigen positive/toxin negative. 7/28>> urine culture (catheterized): 100,000 colonies/mL yeast (likely contamination)  Procedures: 7/28>> nontunneled IJ catheter placed by PCCM.  Consults: PCCM Cardiology  Subjective:   Patient in bed, appears comfortable, denies any headache, no fever, no chest pain or pressure, no shortness of breath , no abdominal pain. No new focal weakness.   Objective: Vitals: Blood pressure 103/73, pulse 86, temperature 98.6 F (37 C), temperature source Oral, resp. rate 18, height 5\' 10"  (1.778 m), weight (!) 184.4 kg, SpO2 96 %.   Exam:  Awake Alert, baseline quadriplegia with severe upper extremity contractures, bilateral lower extremities in the surgical boots, tracheostomy in place, Foley catheter in place, left subclavian central line triple-lumen placed  02/08/2022 [place].AT,PERRAL Supple Neck, No JVD,   Symmetrical Chest wall movement, Good air movement bilaterally, CTAB RRR,No Gallops, Rubs or new Murmurs,  +ve B.Sounds, Abd Soft, No tenderness,   No Cyanosis, Clubbing or edema     Assessment/Plan:  Septic shock: Multiple etiologies including complicated UTI/possible C. difficile colitis-sepsis physiology improved-continue cefepime/oral vancomycin.  Since blood cultures negative-stop IV vancomycin.  Cosyntropin stimulation test unremarkable.  Sepsis pathophysiology resolving.  Possible C. difficile colitis: Has ongoing diarrhea-given numerous hospitalizations/antibiotics exposure-reasonable to treat with oral vancomycin.  Anemia of chronic disease with acute drop due to acute illness and multiple blood draws.  Transfuse 1 unit on 02/10/2022, will also augment blood pressure.  No signs of ongoing active bleeding.  Ventricular tachycardia: Maintaining sinus rhythm--Per cardiology likely under the stress of sepsis, no further work-up, amiodarone discontinued by cardiology, blood pressure too low for beta-blocker, maintain electrolytes within normal limits.  Repeat echocardiogram in 1 to 2 weeks to reevaluate EF.  Newly diagnosed HFrEF: Volume status stable/compensated-doubt any utility in proceeding with ischemic evaluation given patient's overall clinical situation.  Await further input from cardiology.  Elevated troponin: Likely due to demand ischemia-however has significant LV dysfunction-await cardiology input.  Do not think he is a good candidate for ischemic evaluation.  Suspect better to manage medically as much as possible  Small pericardial effusion: Incidental finding on echo-repeat echo in a few weeks.  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  Hypokalemia: Replaced IV and PO.  History of sacral osteomyelitis/stage IV sacral decubitus ulcer/bilateral leg ulcers: All present prior to hospitalization-continue per wound care.  History of  traumatic quadriplegia-sp /trach-SNF resident  Nutrition Status: Nutrition Problem: Increased nutrient needs Etiology: wound healing Signs/Symptoms: estimated needs Interventions: MVI,  Juven  Pressure Ulcer: Pressure Injury 02/07/22 Coccyx Posterior Stage 4 - Full thickness tissue loss with exposed bone, tendon or muscle. large stage 3 with undermining all around; no drainage, (Active)  02/07/22 1830  Location: Coccyx  Location Orientation: Posterior  Staging: Stage 4 - Full thickness tissue loss with exposed bone, tendon or muscle.  Wound Description (Comments): large stage 3 with undermining all around; no drainage,  Present on Admission:   Dressing Type Foam - Lift dressing to assess site every shift 02/10/22 2248     Pressure Injury 02/07/22 Tibial Right;Lateral Stage 2 -  Partial thickness loss of dermis presenting as a shallow open injury with a red, pink wound bed without slough. (Active)  02/07/22 1845  Location: Tibial  Location Orientation: Right;Lateral  Staging: Stage 2 -  Partial thickness loss of dermis presenting as a shallow open injury with a red, pink wound bed without slough.  Wound Description (Comments):   Present on Admission: Yes  Dressing Type Foam - Lift dressing to assess site every shift 02/10/22 2248     Pressure Injury 02/07/22 Tibial Left;Lateral Unstageable - Full thickness tissue loss in which the base of the injury is covered by slough (yellow, tan, gray, green or brown) and/or eschar (tan, brown or black) in the wound bed. (Active)  02/07/22 1845  Location: Tibial  Location Orientation: Left;Lateral  Staging: Unstageable - Full thickness tissue loss in which the base of the injury is covered by slough (yellow, tan, gray, green or brown) and/or eschar (tan, brown or black) in the wound bed.  Wound Description (Comments):   Present on Admission: Yes  Dressing Type Foam - Lift dressing to assess site every shift 02/10/22 2248     Pressure Injury  02/07/22 Tibial Left;Proximal;Lateral Stage 2 -  Partial thickness loss of dermis presenting as a shallow open injury with a red, pink wound bed without slough. dry wound bed (Active)  02/07/22 1845  Location: Tibial  Location Orientation: Left;Proximal;Lateral  Staging: Stage 2 -  Partial thickness loss of dermis presenting as a shallow open injury with a red, pink wound bed without slough.  Wound Description (Comments): dry wound bed  Present on Admission: Yes  Dressing Type Foam - Lift dressing to assess site every shift 02/10/22 2248     Pressure Injury 02/07/22 Heel Right Stage 2 -  Partial thickness loss of dermis presenting as a shallow open injury with a red, pink wound bed without slough. (Active)  02/07/22 1845  Location: Heel  Location Orientation: Right  Staging: Stage 2 -  Partial thickness loss of dermis presenting as a shallow open injury with a red, pink wound bed without slough.  Wound Description (Comments):   Present on Admission: Yes  Dressing Type Foam - Lift dressing to assess site every shift 02/10/22 2248     Pressure Injury 02/07/22 Buttocks Right Stage 2 -  Partial thickness loss of dermis presenting as a shallow open injury with a red, pink wound bed without slough. (Active)  02/07/22 1940  Location: Buttocks  Location Orientation: Right  Staging: Stage 2 -  Partial thickness loss of dermis presenting as a shallow open injury with a red, pink wound bed without slough.  Wound Description (Comments):   Present on Admission: Yes  Dressing Type Foam - Lift dressing to assess site every shift 02/09/22 2313     Pressure Injury 02/07/22 Pretibial Proximal;Right;Lateral Stage 2 -  Partial thickness loss of dermis presenting as a shallow open injury with  a red, pink wound bed without slough. (Active)  02/07/22 1900  Location: Pretibial  Location Orientation: Proximal;Right;Lateral  Staging: Stage 2 -  Partial thickness loss of dermis presenting as a shallow open  injury with a red, pink wound bed without slough.  Wound Description (Comments):   Present on Admission: Yes  Dressing Type Foam - Lift dressing to assess site every shift 02/09/22 2313   Code status:   Code Status: Full Code   DVT Prophylaxis: apixaban (ELIQUIS) tablet 5 mg     Family Communication:   Daughter [name] [phone] on 02/11/22   Disposition Plan: Status is: Inpatient Remains inpatient appropriate because: Resolving sepsis physiology.  Remains on antibiotics-not yet stable for discharge.   Planned Discharge Destination:Skilled nursing facility   Diet: Diet Order             Diet regular Room service appropriate? Yes with Assist; Fluid consistency: Thin  Diet effective now                   MEDICATIONS: Scheduled Meds:  apixaban  5 mg Oral BID   Chlorhexidine Gluconate Cloth  6 each Topical Daily   multivitamin with minerals  1 tablet Oral Daily   mupirocin ointment  1 Application Nasal BID   nutrition supplement (JUVEN)  1 packet Oral BID BM   mouth rinse  15 mL Mouth Rinse 4 times per day   potassium chloride  40 mEq Oral Once   sodium chloride flush  10-40 mL Intracatheter Q12H   vancomycin  125 mg Oral Q6H   Continuous Infusions:  ceFEPime (MAXIPIME) IV     lactated ringers 1,000 mL with potassium chloride 40 mEq infusion     magnesium sulfate bolus IVPB     PRN Meds:.baclofen, diphenhydrAMINE, docusate sodium, ondansetron (ZOFRAN) IV, mouth rinse, mouth rinse, polyethylene glycol, zolpidem   I have personally reviewed following labs and imaging studies  LABORATORY DATA:  Recent Labs  Lab 02/07/22 2023 02/08/22 0428 02/10/22 0357 02/11/22 0444  WBC 14.6* 12.2* 10.7* 10.2  HGB 8.3* 7.8* 7.2* 8.3*  HCT 26.7* 25.0* 22.6* 26.1*  PLT 627* 548* 420* 446*  MCV 76.5* 77.2* 77.1* 79.1*  MCH 23.8* 24.1* 24.6* 25.2*  MCHC 31.1 31.2 31.9 31.8  RDW 20.6* 20.7* 20.8* 20.6*  LYMPHSABS  --   --   --  3.3  MONOABS  --   --   --  0.8  EOSABS   --   --   --  0.3  BASOSABS  --   --   --  0.1    Recent Labs  Lab 02/07/22 2023 02/07/22 2106 02/07/22 2321 02/08/22 0428 02/08/22 1610 02/09/22 0347 02/09/22 0721 02/10/22 0357 02/11/22 0444  NA 142  --   --  139 138 135  --   --  139  K 2.4*  --   --  3.5 2.9* 3.5  --   --  2.8*  CL 116*  --   --  115* 113* 113*  --   --  108  CO2 22  --   --  21* 21* 19*  --   --  26  GLUCOSE 147*  --   --  108* 88 82  --   --  79  BUN 10  --   --  9 5* 7  --   --  12  CREATININE 0.69  --   --  0.64 0.59* 0.61  --   --  0.39*  CALCIUM 7.5*  --   --  7.2* 7.3* 7.1*  --   --  7.4*  AST 19  --   --   --   --   --   --   --  18  ALT 15  --   --   --   --   --   --   --  19  ALKPHOS 148*  --   --   --   --   --   --   --  135*  BILITOT 0.5  --   --   --   --   --   --   --  0.3  ALBUMIN <1.5*  --   --   --   --   --   --   --  <1.5*  MG  --   --  2.3  --   --   --  1.8 2.1 1.9  CRP  --   --   --   --   --   --   --   --  4.4*  PROCALCITON <0.10  --   --   --   --   --   --   --   --   LATICACIDVEN  --  1.0  --   --   --   --   --   --   --   BNP  --   --   --   --   --   --   --   --  584.1*    RADIOLOGY STUDIES/RESULTS: No results found.   LOS: 4 days   ;Signature  Lala Lund M.D on 02/11/2022 at 9:02 AM   -  To page go to [email or web address]

## 2022-02-11 NOTE — Progress Notes (Signed)
Speech Language Pathology Treatment: Dysphagia  Patient Details Name: Luke Wright MRN: [id number] DOB: 06-27-1971 Today's Date: 02/11/2022 Time: 1000-1025 SLP Time Calculation (min) (ACUTE ONLY): 25 min  Assessment / Plan / Recommendation Clinical Impression  Pt seen at bedside for skilled ST intervention targeting goals for dysphagia. Pt was awake and alert, RN present providing meds whole with liquid. Pt tolerated meds without overt s/s aspiration. He accepted trials of fruit and thin liquid, and tolerated each well. PMSV was in place upon arrival of SLP to room, and throughout PO trials. Valve left in place after session. Recommend continuing current diet (reg/thin), meds whole (1 at a time) with liquid. ST signing off at this time. Please reconsult if needs arise in the future.   HPI HPI: Patient is a 51yo male with PMH: recurrent strokes, HTN, STEMI s/p RCA stent 2014, DM-2 insulin dependent, HLD, cocaine use. He presented to the hospital on 02/09/22 with slurred speech. MRI brain revealed 55m acute ischemic nonhemmorhagic left ventral pons/midbrain infarct; CTA head and neck with no large vessel occlusion.      SLP Plan  Discharge SLP treatment due to goals met      Recommendations for follow up therapy are one component of a multi-disciplinary discharge planning process, led by the attending physician.  Recommendations may be updated based on patient status, additional functional criteria and insurance authorization.    Recommendations  Diet recommendations: Regular;Thin liquid Liquids provided via: Straw Medication Administration: Whole meds with liquid Supervision: Trained caregiver to feed patient Compensations: Minimize environmental distractions;Slow rate;Small sips/bites Postural Changes and/or Swallow Maneuvers: Seated upright 90 degrees;Upright 30-60 min after meal      Patient may use Passy-Muir Speech Valve: During all waking hours (remove during sleep);During PO  intake/meals PMSV Supervision: Intermittent MD: Please consider changing trach tube to : Smaller size;Cuffless         Oral Care Recommendations: Oral care BID;Staff/trained caregiver to provide oral care Follow Up Recommendations: No SLP follow up Assistance recommended at discharge: Frequent or constant Supervision/Assistance SLP Visit Diagnosis: Dysphagia, unspecified (R13.10) Plan: Discharge SLP treatment due to (comment)          Festus Pursel B. BQuentin Ore MMadison Hospital CPort LeydenSpeech Language Pathologist Office: [phone] BShonna Chock7/31/2023, 10:43 AM

## 2022-02-12 DIAGNOSIS — A419 Sepsis, unspecified organism: Secondary | ICD-10-CM | POA: Diagnosis not present

## 2022-02-12 DIAGNOSIS — R6521 Severe sepsis with septic shock: Secondary | ICD-10-CM | POA: Diagnosis not present

## 2022-02-12 LAB — CBC WITH DIFFERENTIAL/PLATELET
Abs Immature Granulocytes: 0.06 10*3/uL (ref 0.00–0.07)
Basophils Absolute: 0.1 10*3/uL (ref 0.0–0.1)
Basophils Relative: 1 %
Eosinophils Absolute: 0.4 10*3/uL (ref 0.0–0.5)
Eosinophils Relative: 3 %
HCT: 27 % — ABNORMAL LOW (ref 39.0–52.0)
Hemoglobin: 8.5 g/dL — ABNORMAL LOW (ref 13.0–17.0)
Immature Granulocytes: 0 %
Lymphocytes Relative: 20 %
Lymphs Abs: 2.7 10*3/uL (ref 0.7–4.0)
MCH: 24.9 pg — ABNORMAL LOW (ref 26.0–34.0)
MCHC: 31.5 g/dL (ref 30.0–36.0)
MCV: 78.9 fL — ABNORMAL LOW (ref 80.0–100.0)
Monocytes Absolute: 1.1 10*3/uL — ABNORMAL HIGH (ref 0.1–1.0)
Monocytes Relative: 8 %
Neutro Abs: 9.1 10*3/uL — ABNORMAL HIGH (ref 1.7–7.7)
Neutrophils Relative %: 68 %
Platelets: 409 10*3/uL — ABNORMAL HIGH (ref 150–400)
RBC: 3.42 MIL/uL — ABNORMAL LOW (ref 4.22–5.81)
RDW: 21.3 % — ABNORMAL HIGH (ref 11.5–15.5)
Smear Review: NORMAL
WBC: 13.5 10*3/uL — ABNORMAL HIGH (ref 4.0–10.5)
nRBC: 0 % (ref 0.0–0.2)

## 2022-02-12 LAB — COMPREHENSIVE METABOLIC PANEL
ALT: 19 U/L (ref 0–44)
AST: 23 U/L (ref 15–41)
Albumin: <1.5 g/dL — ABNORMAL LOW (ref 3.5–5.0)
Alkaline Phosphatase: 134 U/L — ABNORMAL HIGH (ref 38–126)
Anion gap: 5 (ref 5–15)
BUN: 9 mg/dL (ref 6–20)
CO2: 25 mmol/L (ref 22–32)
Calcium: 7.6 mg/dL — ABNORMAL LOW (ref 8.9–10.3)
Chloride: 105 mmol/L (ref 98–111)
Creatinine, Ser: 0.45 mg/dL — ABNORMAL LOW (ref 0.61–1.24)
GFR, Estimated: >60 mL/min (ref >60)
Glucose, Bld: 73 mg/dL (ref 70–99)
Potassium: 3.9 mmol/L (ref 3.5–5.1)
Sodium: 135 mmol/L (ref 135–145)
Total Bilirubin: 0.4 mg/dL (ref 0.3–1.2)
Total Protein: 4.6 g/dL — ABNORMAL LOW (ref 6.5–8.1)

## 2022-02-12 LAB — MAGNESIUM: Magnesium: 2 mg/dL (ref 1.7–2.4)

## 2022-02-12 LAB — CULTURE, BLOOD (ROUTINE X 2)
Culture: NO GROWTH
Special Requests: ADEQUATE

## 2022-02-12 LAB — BRAIN NATRIURETIC PEPTIDE: B Natriuretic Peptide: 318.6 pg/mL — ABNORMAL HIGH (ref 0.0–100.0)

## 2022-02-12 LAB — PROCALCITONIN: Procalcitonin: <0.1 ng/mL

## 2022-02-12 LAB — C-REACTIVE PROTEIN: CRP: 3.5 mg/dL — ABNORMAL HIGH (ref <1.0)

## 2022-02-12 MED ORDER — MIDODRINE HCL 5 MG PO TABS
10.0000 mg | ORAL_TABLET | Freq: Two times a day (BID) | ORAL | Status: DC
  Administered 2022-02-12 – 2022-02-13 (×3): 10 mg via ORAL
  Filled 2022-02-12 (×3): qty 2

## 2022-02-12 MED ORDER — POTASSIUM CHLORIDE CRYS ER 20 MEQ PO TBCR
20.0000 meq | EXTENDED_RELEASE_TABLET | Freq: Once | ORAL | Status: AC
  Administered 2022-02-12: 20 meq via ORAL
  Filled 2022-02-12: qty 1

## 2022-02-12 NOTE — Progress Notes (Signed)
PROGRESS NOTE        PATIENT DETAILS Name: Luke Wright Age: 51 y.o. Sex: male Date of Birth: November 04, 1970 Admit Date: 02/07/2022 Admitting Physician Cheri Fowler, MD WUJ:[id number], Provider, MD  Brief Summary: Patient is a 51 y.o.  male with history of quadriplegia-stage IV sacral decubitus-trach-resident of SNF-who presented to [facility] ICU from [facility] management of septic shock, V. tach (in a setting of hypokalemia).  Patient was stabilized with IV antibiotics-started on amiodarone-and subsequently transferred to [facility] service on 7/29.  Significant events: 7/27>> transfer to [facility] ICU from [facility]-septic shock-V. tach (hypokalemia).  PICC/Foley in place on arrival. 7/29>> transfer to [facility] service.  Significant studies: 7/27>> CXR: Lucency at right apex-artifact versus small pneumothorax. 7/27>> CXR: No pneumothorax.  Skinfolds at bilateral lung apices. 7/28>> Echo: EF 30-35%, global hypokinesis, grade 2 diastolic dysfunction.  Small pericardial effusion.  Significant microbiology data: 7/27>> blood culture: Negative 7/28>> stool C. difficile: Antigen positive/toxin negative. 7/28>> urine culture (catheterized): 100,000 colonies/mL yeast (likely contamination)  Procedures: 7/28>> nontunneled IJ catheter placed by PCCM.  Consults: PCCM Cardiology  Subjective:   Patient in bed, appears comfortable, denies any headache, no fever, no chest pain or pressure, no shortness of breath , no abdominal pain. No new focal weakness.   Objective: Vitals: Blood pressure 98/66, pulse 86, temperature 98.2 F (36.8 C), temperature source Oral, resp. rate 20, height 5\' 10"  (1.778 m), weight (!) 184.4 kg, SpO2 92 %.   Exam:  Awake Alert, baseline quadriplegia with severe upper extremity contractures, bilateral lower extremities in the surgical boots, tracheostomy in place, Foley catheter in place, left subclavian central line triple-lumen placed  02/08/2022 [place].AT,PERRAL Supple Neck, No JVD,   Symmetrical Chest wall movement, Good air movement bilaterally, CTAB RRR,No Gallops, Rubs or new Murmurs,  +ve B.Sounds, Abd Soft, No tenderness,   No Cyanosis, Clubbing or edema    Assessment/Plan:  Septic shock: Multiple etiologies including complicated UTI/possible C. difficile colitis-sepsis physiology improved-continue cefepime/oral vancomycin.  Since blood cultures negative-stop IV Vancomycin.  Cosyntropin stimulation test unremarkable.  Sepsis pathophysiology has resolved, his Foley was changed recently in the outpatient setting.  Trend procalcitonin tentative stop date for antibiotics 02/13/2022.  Possible C. difficile colitis: Has ongoing diarrhea-given numerous hospitalizations/antibiotics exposure-reasonable to treat with oral vancomycin.  Anemia of chronic disease with acute drop due to acute illness and multiple blood draws.  Transfuse 1 unit on 02/10/2022, will also augment blood pressure.  No signs of ongoing active bleeding.  Ventricular tachycardia: Maintaining sinus rhythm--Per cardiology likely under the stress of sepsis, no further work-up, amiodarone discontinued by cardiology, blood pressure too low for beta-blocker, maintain electrolytes within normal limits.  Repeat echocardiogram in 1 to 2 weeks to reevaluate EF.  Newly diagnosed HFrEF: Volume status stable/compensated-doubt any utility in proceeding with ischemic evaluation given patient's overall clinical situation.  Await further input from cardiology.  Elevated troponin: Likely due to demand ischemia-however has significant LV dysfunction-await cardiology input.  Do not think he is a good candidate for ischemic evaluation.  Suspect better to manage medically as much as possible  Small pericardial effusion: Incidental finding on echo-repeat echo in a few weeks.  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  Chronic hypotension.  Midodrine.    Hypokalemia: Replaced IV and  PO.  History of sacral osteomyelitis/stage IV sacral decubitus ulcer/bilateral leg ulcers: All present prior to hospitalization-continue per  wound care.  History of traumatic quadriplegia-sp /trach-SNF resident  Nutrition Status: Nutrition Problem: Increased nutrient needs Etiology: wound healing Signs/Symptoms: estimated needs Interventions: MVI, Juven  Pressure Ulcer: Pressure Injury 02/07/22 Coccyx Posterior Stage 4 - Full thickness tissue loss with exposed bone, tendon or muscle. large stage 3 with undermining all around; no drainage, (Active)  02/07/22 1830  Location: Coccyx  Location Orientation: Posterior  Staging: Stage 4 - Full thickness tissue loss with exposed bone, tendon or muscle.  Wound Description (Comments): large stage 3 with undermining all around; no drainage,  Present on Admission:   Dressing Type Foam - Lift dressing to assess site every shift 02/11/22 2005     Pressure Injury 02/07/22 Tibial Right;Lateral Stage 2 -  Partial thickness loss of dermis presenting as a shallow open injury with a red, pink wound bed without slough. (Active)  02/07/22 1845  Location: Tibial  Location Orientation: Right;Lateral  Staging: Stage 2 -  Partial thickness loss of dermis presenting as a shallow open injury with a red, pink wound bed without slough.  Wound Description (Comments):   Present on Admission: Yes  Dressing Type Foam - Lift dressing to assess site every shift 02/11/22 2005     Pressure Injury 02/07/22 Tibial Left;Lateral Unstageable - Full thickness tissue loss in which the base of the injury is covered by slough (yellow, tan, gray, green or brown) and/or eschar (tan, brown or black) in the wound bed. (Active)  02/07/22 1845  Location: Tibial  Location Orientation: Left;Lateral  Staging: Unstageable - Full thickness tissue loss in which the base of the injury is covered by slough (yellow, tan, gray, green or brown) and/or eschar (tan, brown or black) in the wound  bed.  Wound Description (Comments):   Present on Admission: Yes  Dressing Type Foam - Lift dressing to assess site every shift 02/11/22 2005     Pressure Injury 02/07/22 Tibial Left;Proximal;Lateral Stage 2 -  Partial thickness loss of dermis presenting as a shallow open injury with a red, pink wound bed without slough. dry wound bed (Active)  02/07/22 1845  Location: Tibial  Location Orientation: Left;Proximal;Lateral  Staging: Stage 2 -  Partial thickness loss of dermis presenting as a shallow open injury with a red, pink wound bed without slough.  Wound Description (Comments): dry wound bed  Present on Admission: Yes  Dressing Type Foam - Lift dressing to assess site every shift 02/11/22 2005     Pressure Injury 02/07/22 Heel Right Stage 2 -  Partial thickness loss of dermis presenting as a shallow open injury with a red, pink wound bed without slough. (Active)  02/07/22 1845  Location: Heel  Location Orientation: Right  Staging: Stage 2 -  Partial thickness loss of dermis presenting as a shallow open injury with a red, pink wound bed without slough.  Wound Description (Comments):   Present on Admission: Yes  Dressing Type Foam - Lift dressing to assess site every shift 02/11/22 2005     Pressure Injury 02/07/22 Buttocks Right Stage 2 -  Partial thickness loss of dermis presenting as a shallow open injury with a red, pink wound bed without slough. (Active)  02/07/22 1940  Location: Buttocks  Location Orientation: Right  Staging: Stage 2 -  Partial thickness loss of dermis presenting as a shallow open injury with a red, pink wound bed without slough.  Wound Description (Comments):   Present on Admission: Yes  Dressing Type Foam - Lift dressing to assess site every shift 02/11/22 2005  Pressure Injury 02/07/22 Pretibial Proximal;Right;Lateral Stage 2 -  Partial thickness loss of dermis presenting as a shallow open injury with a red, pink wound bed without slough. (Active)   02/07/22 1900  Location: Pretibial  Location Orientation: Proximal;Right;Lateral  Staging: Stage 2 -  Partial thickness loss of dermis presenting as a shallow open injury with a red, pink wound bed without slough.  Wound Description (Comments):   Present on Admission: Yes  Dressing Type Foam - Lift dressing to assess site every shift 02/09/22 2313   Code status:   Code Status: Full Code   DVT Prophylaxis: apixaban (ELIQUIS) tablet 5 mg     Family Communication:   Daughter [name] [phone] on 02/11/22   Disposition Plan: Status is: Inpatient Remains inpatient appropriate because: Resolving sepsis physiology.  Remains on antibiotics-not yet stable for discharge.   Planned Discharge Destination:Skilled nursing facility   Diet: Diet Order             Diet regular Room service appropriate? Yes with Assist; Fluid consistency: Thin  Diet effective now                   MEDICATIONS: Scheduled Meds:  apixaban  5 mg Oral BID   Chlorhexidine Gluconate Cloth  6 each Topical Daily   midodrine  10 mg Oral BID WC   multivitamin with minerals  1 tablet Oral Daily   mupirocin ointment  1 Application Nasal BID   nutrition supplement (JUVEN)  1 packet Oral BID BM   mouth rinse  15 mL Mouth Rinse 4 times per day   sodium chloride flush  10-40 mL Intracatheter Q12H   vancomycin  125 mg Oral Q6H   Continuous Infusions:  ceFEPime (MAXIPIME) IV 2 g (02/12/22 1035)   PRN Meds:.baclofen, diphenhydrAMINE, docusate sodium, ondansetron (ZOFRAN) IV, mouth rinse, mouth rinse, polyethylene glycol, zolpidem   I have personally reviewed following labs and imaging studies  LABORATORY DATA:  Recent Labs  Lab 02/07/22 2023 02/08/22 0428 02/10/22 0357 02/11/22 0444 02/12/22 0406  WBC 14.6* 12.2* 10.7* 10.2 13.5*  HGB 8.3* 7.8* 7.2* 8.3* 8.5*  HCT 26.7* 25.0* 22.6* 26.1* 27.0*  PLT 627* 548* 420* 446* 409*  MCV 76.5* 77.2* 77.1* 79.1* 78.9*  MCH 23.8* 24.1* 24.6* 25.2* 24.9*   MCHC 31.1 31.2 31.9 31.8 31.5  RDW 20.6* 20.7* 20.8* 20.6* 21.3*  LYMPHSABS  --   --   --  3.3 2.7  MONOABS  --   --   --  0.8 1.1*  EOSABS  --   --   --  0.3 0.4  BASOSABS  --   --   --  0.1 0.1    Recent Labs  Lab 02/07/22 2023 02/07/22 2106 02/07/22 2321 02/08/22 0428 02/08/22 1610 02/09/22 0347 02/09/22 0721 02/10/22 0357 02/11/22 0444 02/11/22 1720 02/12/22 0406  NA 142  --   --  139 138 135  --   --  139  --  135  K 2.4*  --   --  3.5 2.9* 3.5  --   --  2.8* 3.3* 3.9  CL 116*  --   --  115* 113* 113*  --   --  108  --  105  CO2 22  --   --  21* 21* 19*  --   --  26  --  25  GLUCOSE 147*  --   --  108* 88 82  --   --  79  --  73  BUN 10  --   --  9 5* 7  --   --  12  --  9  CREATININE 0.69  --   --  0.64 0.59* 0.61  --   --  0.39*  --  0.45*  CALCIUM 7.5*  --   --  7.2* 7.3* 7.1*  --   --  7.4*  --  7.6*  AST 19  --   --   --   --   --   --   --  18  --  23  ALT 15  --   --   --   --   --   --   --  19  --  19  ALKPHOS 148*  --   --   --   --   --   --   --  135*  --  134*  BILITOT 0.5  --   --   --   --   --   --   --  0.3  --  0.4  ALBUMIN <1.5*  --   --   --   --   --   --   --  <1.5*  --  <1.5*  MG  --   --  2.3  --   --   --  1.8 2.1 1.9  --  2.0  CRP  --   --   --   --   --   --   --   --  4.4*  --  3.5*  PROCALCITON <0.10  --   --   --   --   --   --   --   --   --   --   LATICACIDVEN  --  1.0  --   --   --   --   --   --   --   --   --   BNP  --   --   --   --   --   --   --   --  584.1*  --  318.6*    RADIOLOGY STUDIES/RESULTS: No results found.   LOS: 5 days   ;Signature  Susa Raring M.D on 02/12/2022 at 10:44 AM   -  To page go to [email or web address]

## 2022-02-12 NOTE — TOC Progression Note (Signed)
Transition of Care [facility]) - Progression Note    Patient Details  Name: Luke Wright MRN: [id number] Date of Birth: 12-25-1970  Transition of Care [facility]) CM/SW Contact  Dale Kingston, Student-Social Work Phone Number: 02/12/2022, 3:42 PM  Clinical Narrative:    MSW intern reached out to patient's daughter, [name] to discuss the discharge plan. MSW intern advised patient's daughter Jeri Modena was the only bed offer at this time. MSW intern advised she would reach back out tomorrow to discuss further. CSW checked with [facility] as well and they were unable to offer a bed at this time.         Expected Discharge Plan and Services                                                 Social Determinants of Health (SDOH) Interventions    Readmission Risk Interventions     No data to display

## 2022-02-13 DIAGNOSIS — R6521 Severe sepsis with septic shock: Secondary | ICD-10-CM | POA: Diagnosis not present

## 2022-02-13 DIAGNOSIS — A419 Sepsis, unspecified organism: Secondary | ICD-10-CM | POA: Diagnosis not present

## 2022-02-13 LAB — ZINC: Zinc: 50 ug/dL (ref 44–115)

## 2022-02-13 LAB — CBC WITH DIFFERENTIAL/PLATELET
Abs Immature Granulocytes: 0 10*3/uL (ref 0.00–0.07)
Basophils Absolute: 0.1 10*3/uL (ref 0.0–0.1)
Basophils Relative: 1 %
Eosinophils Absolute: 0 10*3/uL (ref 0.0–0.5)
Eosinophils Relative: 0 %
HCT: 25.2 % — ABNORMAL LOW (ref 39.0–52.0)
Hemoglobin: 7.9 g/dL — ABNORMAL LOW (ref 13.0–17.0)
Lymphocytes Relative: 6 %
Lymphs Abs: 0.7 10*3/uL (ref 0.7–4.0)
MCH: 25 pg — ABNORMAL LOW (ref 26.0–34.0)
MCHC: 31.3 g/dL (ref 30.0–36.0)
MCV: 79.7 fL — ABNORMAL LOW (ref 80.0–100.0)
Monocytes Absolute: 0 10*3/uL — ABNORMAL LOW (ref 0.1–1.0)
Monocytes Relative: 0 %
Neutro Abs: 11.4 10*3/uL — ABNORMAL HIGH (ref 1.7–7.7)
Neutrophils Relative %: 93 %
Platelets: 402 10*3/uL — ABNORMAL HIGH (ref 150–400)
RBC: 3.16 MIL/uL — ABNORMAL LOW (ref 4.22–5.81)
RDW: 21.3 % — ABNORMAL HIGH (ref 11.5–15.5)
WBC: 12.3 10*3/uL — ABNORMAL HIGH (ref 4.0–10.5)
nRBC: 0 % (ref 0.0–0.2)

## 2022-02-13 LAB — COMPREHENSIVE METABOLIC PANEL
ALT: 18 U/L (ref 0–44)
AST: 18 U/L (ref 15–41)
Albumin: <1.5 g/dL — ABNORMAL LOW (ref 3.5–5.0)
Alkaline Phosphatase: 139 U/L — ABNORMAL HIGH (ref 38–126)
Anion gap: 4 — ABNORMAL LOW (ref 5–15)
BUN: 8 mg/dL (ref 6–20)
CO2: 26 mmol/L (ref 22–32)
Calcium: 7.4 mg/dL — ABNORMAL LOW (ref 8.9–10.3)
Chloride: 105 mmol/L (ref 98–111)
Creatinine, Ser: 0.59 mg/dL — ABNORMAL LOW (ref 0.61–1.24)
GFR, Estimated: >60 mL/min (ref >60)
Glucose, Bld: 82 mg/dL (ref 70–99)
Potassium: 3.1 mmol/L — ABNORMAL LOW (ref 3.5–5.1)
Sodium: 135 mmol/L (ref 135–145)
Total Bilirubin: 0.5 mg/dL (ref 0.3–1.2)
Total Protein: 4.6 g/dL — ABNORMAL LOW (ref 6.5–8.1)

## 2022-02-13 LAB — MAGNESIUM: Magnesium: 1.8 mg/dL (ref 1.7–2.4)

## 2022-02-13 LAB — C-REACTIVE PROTEIN: CRP: 5.6 mg/dL — ABNORMAL HIGH (ref <1.0)

## 2022-02-13 LAB — VITAMIN C: Vitamin C: 0.8 mg/dL (ref 0.4–2.0)

## 2022-02-13 LAB — BRAIN NATRIURETIC PEPTIDE: B Natriuretic Peptide: 365.7 pg/mL — ABNORMAL HIGH (ref 0.0–100.0)

## 2022-02-13 LAB — PROCALCITONIN: Procalcitonin: <0.1 ng/mL

## 2022-02-13 MED ORDER — BACLOFEN 20 MG PO TABS: 20.0000 mg | ORAL_TABLET | Freq: Three times a day (TID) | ORAL | 0 refills | Status: AC | PRN

## 2022-02-13 MED ORDER — VANCOMYCIN HCL 125 MG PO CAPS: 125.0000 mg | ORAL_CAPSULE | Freq: Four times a day (QID) | ORAL | 0 refills | Status: AC

## 2022-02-13 MED ORDER — MIDODRINE HCL 10 MG PO TABS: 10.0000 mg | ORAL_TABLET | Freq: Two times a day (BID) | ORAL | Status: AC

## 2022-02-13 MED ORDER — JUVEN PO PACK: 1.0000 | PACK | Freq: Two times a day (BID) | ORAL | 0 refills | Status: AC

## 2022-02-13 MED ORDER — BISACODYL 10 MG RE SUPP: 10.0000 mg | Freq: Every day | RECTAL | 0 refills | Status: AC | PRN

## 2022-02-13 MED ORDER — POLYETHYLENE GLYCOL 3350 17 GM/SCOOP PO POWD: 17.0000 g | Freq: Two times a day (BID) | ORAL | 0 refills | Status: AC | PRN

## 2022-02-13 MED ORDER — FOLIC ACID 1 MG PO TABS: 1.0000 mg | ORAL_TABLET | Freq: Every day | ORAL | 0 refills | Status: AC

## 2022-02-13 MED ORDER — POTASSIUM CHLORIDE CRYS ER 20 MEQ PO TBCR
40.0000 meq | EXTENDED_RELEASE_TABLET | Freq: Once | ORAL | Status: AC
  Administered 2022-02-13: 40 meq via ORAL
  Filled 2022-02-13: qty 2

## 2022-02-13 NOTE — TOC Transition Note (Addendum)
Transition of Care [facility]) - CM/SW Discharge Note   Patient Details  Name: Luke Wright MRN: [id number] Date of Birth: [birth year]  Transition of Care [facility]) CM/SW Contact:  Mearl Latin, LCSW Phone Number: 02/13/2022, 9:23 AM   Clinical Narrative:    Patient will DC to: [facility] Anticipated DC date: 02/13/22 Family notified: Daughter, Biomedical scientist by: Sharin Mons   Per MD patient ready for DC to [facility]. RN to call report prior to discharge [phone] room 210A). RN, patient, patient's family, and facility notified of DC. Discharge Summary and FL2 sent to facility. DC packet on chart. Ambulance transport requested for patient.   CSW will sign off for now as social work intervention is no longer needed. Please consult [place] again if new needs arise.     Final next level of care: Skilled Nursing Facility Barriers to Discharge: Barriers Resolved   Patient Goals and CMS Choice Patient states their goals for this hospitalization and ongoing recovery are:: Return to SNF CMS Medicare.gov Compare Post Acute Care list provided to:: Patient Represenative (must comment) Choice offered to / list presented to : Adult Children  Discharge Placement   Existing PASRR number confirmed : 02/13/22          Patient chooses bed at: [facility] Patient to be transferred to facility by: [facility] Name of family member notified: Daughter Patient and family notified of of transfer: 02/13/22  Discharge Plan and Services In-house Referral: Clinical Social Work   Post Acute Care Choice: Skilled Nursing Facility                               Social Determinants of Health (SDOH) Interventions     Readmission Risk Interventions     No data to display

## 2022-02-13 NOTE — Discharge Summary (Signed)
Luke Wright:[id number] DOB: 1970-08-27 DOA: 02/07/2022  PCP: Default, Provider, MD  Admit date: 02/07/2022  Discharge date: 02/13/2022  Admitted From: SNF   Disposition:  SNF   Recommendations for Outpatient Follow-up:   Follow up with PCP in 1-2 weeks  PCP Please obtain BMP/CBC, 2 view CXR in 1week,  (see Discharge instructions)   PCP Please follow up on the following pending results:    Home Health: None   Equipment/Devices: None  Consultations: PCCM, Cardiology Discharge Condition: Fair  CODE STATUS: Full    Diet Recommendation: Heart Healthy   CC - Fever    Brief history of present illness from the day of admission and additional interim summary    51 y.o.  male with history of quadriplegia-stage IV sacral decubitus-trach-resident of SNF-who presented to [facility] ICU from [facility] management of septic shock, V. tach (in a setting of hypokalemia).  Patient was stabilized with IV antibiotics-started on amiodarone-and subsequently transferred to [facility] service on 7/29.   Significant events: 7/27>> transfer to [facility] ICU from [facility]-septic shock-V. tach (hypokalemia).  PICC/Foley in place on arrival. 7/29>> transfer to [facility] service.   Significant studies: 7/27>> CXR: Lucency at right apex-artifact versus small pneumothorax. 7/27>> CXR: No pneumothorax.  Skinfolds at bilateral lung apices. 7/28>> Echo: EF 30-35%, global hypokinesis, grade 2 diastolic dysfunction.  Small pericardial effusion.   Significant microbiology data: 7/27>> blood culture: Negative 7/28>> stool C. difficile: Antigen positive/toxin negative. 7/28>> urine culture (catheterized): 100,000 colonies/mL yeast (likely contamination)   Procedures: 7/28>> nontunneled IJ catheter placed by PCCM.   Consults: PCCM Cardiology                                                                  Hospital Course   Septic shock: Multiple etiologies including complicated UTI/possible C. difficile colitis-sepsis physiology improved-has finished short course of IV antibiotic which was meropenem, since recent history of C. difficile colitis keeping systemic IV antibiotic duration low, sepsis pathophysiology has resolved he has finished his IV antibiotic course.  Continue oral vancomycin for 5 more days then stop on 02/19/2022.    Possible C. difficile colitis: Has ongoing diarrhea-given numerous hospitalizations/antibiotics exposure-reasonable to treat with oral vancomycin.  Stop date 02/19/2022.   Anemia of chronic disease with acute drop due to acute illness and multiple blood draws.  Transfused 1 unit on 02/10/2022, with stable posttransfusion H&H.  No signs of ongoing active bleeding.  Continue oral iron supplementation and folic acid post discharge.  Kindly recheck CBC and BMP in 7 to 10 days postdischarge at [facility].   Ventricular tachycardia: Maintaining sinus rhythm--Per cardiology likely under the stress of sepsis, no further work-up, amiodarone discontinued by cardiology, blood pressure too low for beta-blocker, maintain electrolytes within normal limits.  Repeat echocardiogram in 1 to 2 weeks  to reevaluate EF.  Must follow-up with [facility] in 1 to 2 weeks postdischarge.   Newly diagnosed HFrEF: Volume status stable/compensated-doubt any utility in proceeding with ischemic evaluation given patient's overall clinical situation.  Cleared by cardiology for outpatient follow-up.   Elevated troponin: Likely due to demand ischemia-however has significant LV dysfunction-await cardiology input.  Do not think he is a good candidate for ischemic evaluation.  Suspect better to manage medically as much as possible, post discharge follow-up with his primary cardiologist in 1 to 2 weeks.  Blood pressure too low for beta-blocker.  No aspirin as he  is already on Eliquis.   Small pericardial effusion: Incidental finding on echo-repeat echo in a few weeks.   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   Chronic hypotension.  Midodrine.     Hypokalemia: Replaced IV and PO.  Recheck at SNF in 7 to 10 days.   History of sacral osteomyelitis/stage IV sacral decubitus ulcer/bilateral leg ulcers: All present prior to hospitalization-continue per wound care.   History of traumatic quadriplegia-sp /trach-SNF resident continue supportive care at [facility]   Discharge diagnosis     Principal Problem:   Septic shock (HCC) Active Problems:   Pressure injury of skin    Discharge instructions    Discharge Instructions     Discharge instructions   Complete by: As directed    Follow with Primary MD  in 7 days   Get CBC, CMP, 2 view Chest X ray -  checked next visit within 1 week by SNF MD    Activity: As tolerated with Full fall precautions use walker/cane & assistance as needed  Disposition SNF  Diet: Heart Healthy with feeding assistance and aspiration precautions.   Special Instructions: If you have smoked or chewed Tobacco  in the last 2 yrs please stop smoking, stop any regular Alcohol  and or any Recreational drug use.  On your next visit with your primary care physician please Get Medicines reviewed and adjusted.  Please request your Prim.MD to go over all Hospital Tests and Procedure/Radiological results at the follow up, please get all Hospital records sent to your Prim MD by signing hospital release before you go home.  If you experience worsening of your admission symptoms, develop shortness of breath, life threatening emergency, suicidal or homicidal thoughts you must seek medical attention immediately by calling 911 or calling your MD immediately  if symptoms less severe.  You Must read complete instructions/literature along with all the possible adverse reactions/side effects for all the Medicines you take and that have been prescribed  to you. Take any new Medicines after you have completely understood and accpet all the possible adverse reactions/side effects.   Discharge wound care:   Complete by: As directed    Pressure Injury 04/23/21 Buttocks Left Unstageable - Full thickness tissue loss in which the base of the injury is covered by slough (yellow, tan, gray, green or brown) and/or eschar (tan, brown or black) in the wound bed. Unstageable to left buttock wit 295 days    Pressure Injury 04/23/21 Coccyx Medial Stage 2 -  Partial thickness loss of dermis presenting as a shallow open injury with a red, pink wound bed without slough. Small area to the medial left of left buttock, no yellow slough present. 295 days   Pressure Injury 02/07/22 Buttocks Right Stage 2 -  Partial thickness loss of dermis presenting as a shallow open injury with a red, pink wound bed without slough. 5 days   Pressure Injury  02/07/22 Coccyx Posterior Stage 4 - Full thickness tissue loss with exposed bone, tendon or muscle. large stage 3 with undermining all around; no drainage, 5 days   Pressure Injury 02/07/22 Heel Right Stage 2 -  Partial thickness loss of dermis presenting as a shallow open injury with a red, pink wound bed without slough. 5 days   Pressure Injury 02/07/22 Pretibial Proximal;Right;Lateral Stage 2 -  Partial thickness loss of dermis presenting as a shallow open injury with a red, pink wound bed without slough. 5 days   Pressure Injury 02/07/22 Tibial Left;Lateral Unstageable - Full thickness tissue loss in which the base of the injury is covered by slough (yellow, tan, gray, green or brown) and/or eschar (tan, brown or black) in the wound bed. 5 days   Pressure Injury 02/07/22 Tibial Left;Proximal;Lateral Stage 2 -  Partial thickness loss of dermis presenting as a shallow open injury with a red, pink wound bed without slough. dry wound bed 5 days   Pressure Injury 02/07/22 Tibial Right;Lateral Stage 2 -  Partial thickness loss of dermis  presenting as a shallow open injury with a red, pink wound bed without slough.   Increase activity slowly   Complete by: As directed        Discharge Medications   Allergies as of 02/13/2022   No Known Allergies      Medication List     STOP taking these medications    amitriptyline 25 MG tablet Commonly known as: ELAVIL   ascorbic acid 500 MG tablet Commonly known as: VITAMIN C   gabapentin 300 MG capsule Commonly known as: NEURONTIN   ibuprofen 400 MG tablet Commonly known as: ADVIL   lisinopril 5 MG tablet Commonly known as: ZESTRIL   mirtazapine 7.5 MG tablet Commonly known as: REMERON   Muscle Rub 10-15 % Crea   oxyCODONE 5 MG immediate release tablet Commonly known as: Oxy IR/ROXICODONE   pseudoephedrine 30 MG tablet Commonly known as: SUDAFED   Santyl 250 UNIT/GM ointment Generic drug: collagenase   scopolamine 1 MG/3DAYS Commonly known as: TRANSDERM-SCOP   Senexon-S 8.6-50 MG tablet Generic drug: senna-docusate   SM Mucus Relief 600 MG 12 hr tablet Generic drug: guaiFENesin   tiZANidine 2 MG tablet Commonly known as: ZANAFLEX   Zinc Sulfate 220 (50 Zn) MG Tabs   zolpidem 5 MG tablet Commonly known as: AMBIEN   ZOSYN IV       TAKE these medications    atorvastatin 40 MG tablet Commonly known as: LIPITOR Take 1 tablet (40 mg total) by mouth daily after supper. What changed: when to take this   baclofen 20 MG tablet Commonly known as: LIORESAL Take 1 tablet (20 mg total) by mouth 3 (three) times daily as needed for muscle spasms. Take 20 mg by mouth at 8 AM and 12 NOON. Take 40 mg by mouth at bedtime. What changed:  how much to take when to take this reasons to take this Another medication with the same name was removed. Continue taking this medication, and follow the directions you see here.   bisacodyl 10 MG suppository Commonly known as: DULCOLAX Place 1 suppository (10 mg total) rectally daily as needed for moderate  constipation. What changed:  when to take this reasons to take this   Cymbalta 30 MG capsule Generic drug: DULoxetine Take 30 mg by mouth at bedtime.   Eliquis 5 MG Tabs tablet Generic drug: apixaban Take 5 mg by mouth in the morning and at bedtime.  feeding supplement (PRO-STAT SUGAR FREE 64) Liqd Take 30 mLs by mouth 2 (two) times daily.   ferrous sulfate 325 (65 FE) MG tablet Take 325 mg by mouth daily with breakfast.   folic acid 1 MG tablet Commonly known as: FOLVITE Take 1 tablet (1 mg total) by mouth daily.   ipratropium-albuterol 0.5-2.5 (3) MG/3ML Soln Commonly known as: DUONEB Take 3 mLs by nebulization 3 (three) times daily.   lidocaine 4 % Place 1 patch onto the skin See admin instructions. Apply 1 patch to the right arm (for pain) at 9 AM daily- remove at 9 PM What changed: Another medication with the same name was removed. Continue taking this medication, and follow the directions you see here.   melatonin 3 MG Tabs tablet Take 2 tablets (6 mg total) by mouth at bedtime.   midodrine 10 MG tablet Commonly known as: PROAMATINE Take 1 tablet (10 mg total) by mouth 2 (two) times daily with a meal. What changed:  medication strength how much to take how to take this when to take this   nutrition supplement (JUVEN) Pack Take 1 packet by mouth 2 (two) times daily between meals.   pantoprazole 40 MG tablet Commonly known as: PROTONIX Take 40 mg by mouth in the morning.   polyethylene glycol powder 17 GM/SCOOP powder Commonly known as: GLYCOLAX/MIRALAX Take 17 g by mouth 2 (two) times daily as needed for moderate constipation. What changed:  when to take this reasons to take this   potassium chloride SA 20 MEQ tablet Commonly known as: KLOR-CON M Take 20 mEq by mouth 3 (three) times daily.   PRESCRIPTION MEDICATION 100 Units/mL See admin instructions. Heparin IV flush 100 units/ml: "Flush IV with yellow-syringed heparin (DX: infection)"   Systane  0.4-0.3 % Soln Generic drug: Polyethyl Glycol-Propyl Glycol Place 1 drop into both eyes 3 (three) times daily as needed (for dryness).   vancomycin 125 MG capsule Commonly known as: VANCOCIN Take 1 capsule (125 mg total) by mouth every 6 (six) hours for 5 days.   zinc gluconate 50 MG tablet Take 50 mg by mouth at bedtime.   Zofran 4 MG tablet Generic drug: ondansetron Take 4 mg by mouth daily as needed for nausea.               Discharge Care Instructions  (From admission, onward)           Start     Ordered   02/13/22 0000  Discharge wound care:       Comments: Pressure Injury 04/23/21 Buttocks Left Unstageable - Full thickness tissue loss in which the base of the injury is covered by slough (yellow, tan, gray, green or brown) and/or eschar (tan, brown or black) in the wound bed. Unstageable to left buttock wit 295 days    Pressure Injury 04/23/21 Coccyx Medial Stage 2 -  Partial thickness loss of dermis presenting as a shallow open injury with a red, pink wound bed without slough. Small area to the medial left of left buttock, no yellow slough present. 295 days   Pressure Injury 02/07/22 Buttocks Right Stage 2 -  Partial thickness loss of dermis presenting as a shallow open injury with a red, pink wound bed without slough. 5 days   Pressure Injury 02/07/22 Coccyx Posterior Stage 4 - Full thickness tissue loss with exposed bone, tendon or muscle. large stage 3 with undermining all around; no drainage, 5 days   Pressure Injury 02/07/22 Heel Right Stage 2 -  Partial thickness loss of dermis presenting as a shallow open injury with a red, pink wound bed without slough. 5 days   Pressure Injury 02/07/22 Pretibial Proximal;Right;Lateral Stage 2 -  Partial thickness loss of dermis presenting as a shallow open injury with a red, pink wound bed without slough. 5 days   Pressure Injury 02/07/22 Tibial Left;Lateral Unstageable - Full thickness tissue loss in which the base of the injury  is covered by slough (yellow, tan, gray, green or brown) and/or eschar (tan, brown or black) in the wound bed. 5 days   Pressure Injury 02/07/22 Tibial Left;Proximal;Lateral Stage 2 -  Partial thickness loss of dermis presenting as a shallow open injury with a red, pink wound bed without slough. dry wound bed 5 days   Pressure Injury 02/07/22 Tibial Right;Lateral Stage 2 -  Partial thickness loss of dermis presenting as a shallow open injury with a red, pink wound bed without slough.   02/13/22 0845             Follow-up Information     Jerline Pain, MD. Schedule an appointment as soon as possible for a visit in 1 week(s).   Specialty: Cardiology Contact information: [employer] [place] [address] [phone]                 Major procedures and Radiology Reports - PLEASE review detailed and final reports thoroughly  -       DG CHEST PORT 1 VIEW  Result Date: 02/08/2022 CLINICAL DATA:  Central line placement EXAM: PORTABLE CHEST 1 VIEW COMPARISON:  02/08/2019. FINDINGS: The right IJ central line has been removed. Left IJ central line inserted. Tip at the proximal SVC level. No large effusion or pneumothorax. Low lung volumes with minor basilar atelectasis. Normal heart size and vascularity. No focal pneumonia. Tracheostomy in the midline. Lower cervical fusion hardware partially imaged. IMPRESSION: New left IJ central line tip proximal SVC level. No complicating feature. Electronically Signed   By: Jerilynn Mages.  Shick M.D.   On: 02/08/2022 15:50   ECHOCARDIOGRAM COMPLETE  Result Date: 02/08/2022    ECHOCARDIOGRAM REPORT   Patient Name:   Luke Wright Date of Exam: 02/08/2022 Medical Rec #:  [id number]       Height:       70.9 in Accession #:    [id number]      Weight:       170.2 lb Date of Birth:  1971/05/30       BSA:          1.966 m Patient Age:    66 years        BP:           118/55 mmHg Patient Gender: M               HR:           78 bpm. Exam Location:  Inpatient  Procedure: 2D Echo, Cardiac Doppler and Color Doppler Indications:     Shock ([place]) [id number]  History:         Patient has no prior history of Echocardiogram examinations.                  Stroke; Signs/Symptoms:Hypotension. Quadriplegia trach/PEG but                  not vent dependent. Episode of V. tach noted at outside  hospital.  Sonographer:     Darlina Sicilian RDCS Referring Phys:  [id number] [facility] Diagnosing Phys: [place] IMPRESSIONS  1. Akinesis of the apex. Left ventricular ejection fraction, by estimation, is 30 to 35%. The left ventricle has moderately decreased function. The left ventricle demonstrates global hypokinesis. Left ventricular diastolic parameters are consistent with  Grade II diastolic dysfunction (pseudonormalization).  2. Right ventricular systolic function is normal. The right ventricular size is normal.  3. A small pericardial effusion is present.  4. The mitral valve is grossly normal. No evidence of mitral valve regurgitation.  5. Aortic valve regurgitation is not visualized.  6. Cannot visualize IVC. Comparison(s): No prior Echocardiogram. Conclusion(s)/Recommendation(s): Notable swirling contrast at the apex; cannot definitively rule out LV thrombus; recommend cardiac MRI. FINDINGS  Left Ventricle: Akinesis of the apex. Left ventricular ejection fraction, by estimation, is 30 to 35%. The left ventricle has moderately decreased function. The left ventricle demonstrates global hypokinesis. The left ventricular internal cavity size was normal in size. There is no left ventricular hypertrophy. Left ventricular diastolic parameters are consistent with Grade II diastolic dysfunction (pseudonormalization). Right Ventricle: The right ventricular size is normal. Right ventricular systolic function is normal. Left Atrium: Left atrial size was normal in size. Right Atrium: Right atrial size was normal in size. Pericardium: A small pericardial effusion is present. Mitral  Valve: The mitral valve is grossly normal. No evidence of mitral valve regurgitation. Tricuspid Valve: The tricuspid valve is normal in structure. Tricuspid valve regurgitation is trivial. Aortic Valve: Aortic valve regurgitation is not visualized. Pulmonic Valve: The pulmonic valve was not well visualized. Aorta: The aortic root is normal in size and structure. Venous: Cannot visualize IVC.  LEFT VENTRICLE PLAX 2D LVIDd:         4.00 cm      Diastology LVIDs:         2.85 cm      LV e' medial:    5.70 cm/s LV PW:         1.00 cm      LV E/e' medial:  10.5 LV IVS:        0.90 cm      LV e' lateral:   8.33 cm/s LVOT diam:     2.20 cm      LV E/e' lateral: 7.2 LV SV:         43 LV SV Index:   22 LVOT Area:     3.80 cm  LV Volumes (MOD) LV vol d, MOD A2C: 160.0 ml LV vol d, MOD A4C: 182.0 ml LV vol s, MOD A2C: 99.4 ml LV vol s, MOD A4C: 124.0 ml LV SV MOD A2C:     60.6 ml LV SV MOD A4C:     182.0 ml LV SV MOD BP:      56.1 ml RIGHT VENTRICLE RV S prime:     10.70 cm/s TAPSE (M-mode): 1.4 cm LEFT ATRIUM             Index        RIGHT ATRIUM           Index LA diam:        2.70 cm 1.37 cm/m   RA Area:     11.00 cm LA Vol (A2C):   22.6 ml 11.47 ml/m  RA Volume:   19.80 ml  10.07 ml/m LA Vol (A4C):   38.5 ml 19.58 ml/m LA Biplane Vol: 34.6 ml 17.60 ml/m  AORTIC VALVE LVOT Vmax:  55.40 cm/s LVOT Vmean:  36.500 cm/s LVOT VTI:    0.114 m  AORTA Ao Root diam: 3.40 cm MITRAL VALVE               TRICUSPID VALVE MV Area (PHT): 3.77 cm    TR Peak grad:   14.9 mmHg MV Decel Time: 201 msec    TR Vmax:        193.00 cm/s MV E velocity: 60.00 cm/s MV A velocity: 44.10 cm/s  SHUNTS MV E/A ratio:  1.36        Systemic VTI:  0.11 m                            Systemic Diam: 2.20 cm Phineas Inches Electronically signed by Phineas Inches Signature Date/Time: 02/08/2022/10:41:38 AM    Final (Updated)    DG Chest Port 1 View  Result Date: 02/07/2022 CLINICAL DATA:  Possible pneumothorax EXAM: PORTABLE CHEST 1 VIEW COMPARISON:   02/07/2022 at 1904 hours FINDINGS: Skin fold overlying the right upper hemithorax. Pulmonary markings extend beyond this fold, indicating that this does not reflect pneumothorax. Additional skin fold at the left lung apex. The lungs are essentially clear. No pleural effusion or pneumothorax. Tracheostomy in satisfactory position. The heart is normal in size. Right IJ venous catheter terminates in the lower SVC. Cervical spine fixation hardware, incompletely visualized. IMPRESSION: No pneumothorax is seen.  Skin folds at the bilateral lung apices. These results will be called to the ordering clinician or representative by the Radiologist Assistant, and communication documented in the PACS or [employer]. Electronically Signed   By: Julian Hy M.D.   On: 02/07/2022 23:45   DG Chest Port 1 View  Result Date: 02/07/2022 CLINICAL DATA:  Tracheostomy EXAM: PORTABLE CHEST 1 VIEW COMPARISON:  05/20/2021 FINDINGS: Tracheostomy tube tip about 6.3 cm superior to carina. Hardware in the cervical spine. Atelectasis or scarring in the right perihilar lung. No consolidation or pleural effusion. Possible skin fold artifact at the right apex. Right IJ central venous catheter tip over the SVC. IMPRESSION: 1. Tracheostomy tube with tip about 6.3 cm superior to carina 2. Lucency at the right apex is indeterminate for skin fold artifact versus small pneumothorax, radiographic follow-up is recommended. These results will be called to the ordering clinician or representative by the Radiologist Assistant, and communication documented in the PACS or [employer]. Electronically Signed   By: Donavan Foil M.D.   On: 02/07/2022 19:15    Today   Subjective    Luke Wright today has no headache,no chest abdominal pain,no new weakness tingling or numbness, feels much better     Objective   Blood pressure 101/55, pulse 83, temperature 98.4 F (36.9 C), temperature source Oral, resp. rate 17, height 5\' 10"  (1.778  m),  SpO2 92 %.   Intake/Output Summary (Last 24 hours) at 02/13/2022 0852 Last data filed at 02/13/2022 0428 Gross per 24 hour  Intake --  Output 2250 ml  Net -2250 ml    Exam  Awake Alert, baseline quadriplegia with severe upper extremity contractures, bilateral lower extremities in the surgical boots, tracheostomy in place, Foley catheter in place, left subclavian central line triple-lumen placed 02/08/2022 [place].AT,PERRAL Supple Neck, No JVD,   Symmetrical Chest wall movement, Good air movement bilaterally, CTAB RRR,No Gallops, Rubs or new Murmurs,  +ve B.Sounds, Abd Soft, No tenderness,    Pressure Ulcers POA :     Pressure Injury 02/07/22 Coccyx  Posterior Stage 4 - Full thickness tissue loss with exposed bone, tendon or muscle. large stage 3 with undermining all around; no drainage, (Active)  02/07/22 1830  Location: Coccyx  Location Orientation: Posterior  Staging: Stage 4 - Full thickness tissue loss with exposed bone, tendon or muscle.  Wound Description (Comments): large stage 3 with undermining all around; no drainage,  Present on Admission:   Dressing Type Foam - Lift dressing to assess site every shift 02/11/22 2005     Pressure Injury 02/07/22 Tibial Right;Lateral Stage 2 -  Partial thickness loss of dermis presenting as a shallow open injury with a red, pink wound bed without slough. (Active)  02/07/22 1845  Location: Tibial  Location Orientation: Right;Lateral  Staging: Stage 2 -  Partial thickness loss of dermis presenting as a shallow open injury with a red, pink wound bed without slough.  Wound Description (Comments):   Present on Admission: Yes  Dressing Type Foam - Lift dressing to assess site every shift 02/11/22 2005     Pressure Injury 02/07/22 Tibial Left;Lateral Unstageable - Full thickness tissue loss in which the base of the injury is covered by slough (yellow, tan, gray, green or brown) and/or eschar (tan, brown or black) in the wound bed. (Active)   02/07/22 1845  Location: Tibial  Location Orientation: Left;Lateral  Staging: Unstageable - Full thickness tissue loss in which the base of the injury is covered by slough (yellow, tan, gray, green or brown) and/or eschar (tan, brown or black) in the wound bed.  Wound Description (Comments):   Present on Admission: Yes  Dressing Type Foam - Lift dressing to assess site every shift 02/11/22 2005     Pressure Injury 02/07/22 Tibial Left;Proximal;Lateral Stage 2 -  Partial thickness loss of dermis presenting as a shallow open injury with a red, pink wound bed without slough. dry wound bed (Active)  02/07/22 1845  Location: Tibial  Location Orientation: Left;Proximal;Lateral  Staging: Stage 2 -  Partial thickness loss of dermis presenting as a shallow open injury with a red, pink wound bed without slough.  Wound Description (Comments): dry wound bed  Present on Admission: Yes  Dressing Type Foam - Lift dressing to assess site every shift 02/11/22 2005     Pressure Injury 02/07/22 Heel Right Stage 2 -  Partial thickness loss of dermis presenting as a shallow open injury with a red, pink wound bed without slough. (Active)  02/07/22 1845  Location: Heel  Location Orientation: Right  Staging: Stage 2 -  Partial thickness loss of dermis presenting as a shallow open injury with a red, pink wound bed without slough.  Wound Description (Comments):   Present on Admission: Yes  Dressing Type Foam - Lift dressing to assess site every shift 02/11/22 2005     Pressure Injury 02/07/22 Buttocks Right Stage 2 -  Partial thickness loss of dermis presenting as a shallow open injury with a red, pink wound bed without slough. (Active)  02/07/22 1940  Location: Buttocks  Location Orientation: Right  Staging: Stage 2 -  Partial thickness loss of dermis presenting as a shallow open injury with a red, pink wound bed without slough.  Wound Description (Comments):   Present on Admission: Yes  Dressing Type Foam  - Lift dressing to assess site every shift 02/11/22 2005     Pressure Injury 02/07/22 Pretibial Proximal;Right;Lateral Stage 2 -  Partial thickness loss of dermis presenting as a shallow open injury with a red, pink wound bed without slough. (  Active)  02/07/22 1900  Location: Pretibial  Location Orientation: Proximal;Right;Lateral  Staging: Stage 2 -  Partial thickness loss of dermis presenting as a shallow open injury with a red, pink wound bed without slough.  Wound Description (Comments):   Present on Admission: Yes  Dressing Type Foam - Lift dressing to assess site every shift 02/09/22 2313       Data Review   Recent Labs  Lab 02/08/22 0428 02/10/22 0357 02/11/22 0444 02/12/22 0406 02/13/22 0401  WBC 12.2* 10.7* 10.2 13.5* 12.3*  HGB 7.8* 7.2* 8.3* 8.5* 7.9*  HCT 25.0* 22.6* 26.1* 27.0* 25.2*  PLT 548* 420* 446* 409* 402*  MCV 77.2* 77.1* 79.1* 78.9* 79.7*  MCH 24.1* 24.6* 25.2* 24.9* 25.0*  MCHC 31.2 31.9 31.8 31.5 31.3  RDW 20.7* 20.8* 20.6* 21.3* 21.3*  LYMPHSABS  --   --  3.3 2.7 0.7  MONOABS  --   --  0.8 1.1* 0.0*  EOSABS  --   --  0.3 0.4 0.0  BASOSABS  --   --  0.1 0.1 0.1    Recent Labs  Lab 02/07/22 2023 02/07/22 2106 02/07/22 2321 02/08/22 1610 02/09/22 0347 02/09/22 0721 02/10/22 0357 02/11/22 0444 02/11/22 1720 02/12/22 0406 02/13/22 0401  NA 142  --    < > 138 135  --   --  139  --  135 135  K 2.4*  --    < > 2.9* 3.5  --   --  2.8* 3.3* 3.9 3.1*  CL 116*  --    < > 113* 113*  --   --  108  --  105 105  CO2 22  --    < > 21* 19*  --   --  26  --  25 26  GLUCOSE 147*  --    < > 88 82  --   --  79  --  73 82  BUN 10  --    < > 5* 7  --   --  12  --  9 8  CREATININE 0.69  --    < > 0.59* 0.61  --   --  0.39*  --  0.45* 0.59*  CALCIUM 7.5*  --    < > 7.3* 7.1*  --   --  7.4*  --  7.6* 7.4*  AST 19  --   --   --   --   --   --  18  --  23 18  ALT 15  --   --   --   --   --   --  19  --  19 18  ALKPHOS 148*  --   --   --   --   --   --  135*  --   134* 139*  BILITOT 0.5  --   --   --   --   --   --  0.3  --  0.4 0.5  ALBUMIN <1.5*  --   --   --   --   --   --  <1.5*  --  <1.5* <1.5*  MG  --   --    < >  --   --  1.8 2.1 1.9  --  2.0 1.8  CRP  --   --   --   --   --   --   --  4.4*  --  3.5* 5.6*  PROCALCITON <0.10  --   --   --   --   --   --   --   --  <  0.10 <0.10  LATICACIDVEN  --  1.0  --   --   --   --   --   --   --   --   --   BNP  --   --   --   --   --   --   --  584.1*  --  318.6* 365.7*   < > = values in this interval not displayed.    Total Time in preparing paper work, data evaluation and todays exam - 51 minutes  Lala Lund M.D on 02/13/2022 at 8:52 AM  [facility]

## 2022-02-13 NOTE — Discharge Instructions (Signed)
Follow with Primary MD  in 7 days  ° °Get CBC, CMP, 2 view Chest X ray -  checked next visit within 1 week by SNF MD  ° °Activity: As tolerated with Full fall precautions use walker/cane & assistance as needed ° °Disposition SNF ° °Diet: Heart Healthy with feeding assistance and aspiration precautions. ° °Special Instructions: If you have smoked or chewed Tobacco  in the last 2 yrs please stop smoking, stop any regular Alcohol  and or any Recreational drug use. ° °On your next visit with your primary care physician please Get Medicines reviewed and adjusted. ° °Please request your Prim.MD to go over all Hospital Tests and Procedure/Radiological results at the follow up, please get all Hospital records sent to your Prim MD by signing hospital release before you go home. ° °If you experience worsening of your admission symptoms, develop shortness of breath, life threatening emergency, suicidal or homicidal thoughts you must seek medical attention immediately by calling 911 or calling your MD immediately  if symptoms less severe. ° °You Must read complete instructions/literature along with all the possible adverse reactions/side effects for all the Medicines you take and that have been prescribed to you. Take any new Medicines after you have completely understood and accpet all the possible adverse reactions/side effects.  °  ° °

## 2022-02-15 LAB — VITAMIN A: Vitamin A (Retinoic Acid): 13.7 ug/dL — ABNORMAL LOW (ref 20.1–62.0)

## 2022-04-14 DEATH — deceased

## 2023-07-21 IMAGING — CT CT ABD-PELV W/ CM
2 of 5 series · 15 of 46 positions shown, 17 images · IV contrast (omnipaque)
Comparison: None.

CLINICAL DATA: Pressure ulcer to buttock

EXAM:
CT ABDOMEN AND PELVIS WITH CONTRAST
TECHNIQUE: Multidetector CT imaging of the abdomen and pelvis was performed
using the standard protocol following bolus administration of
intravenous contrast.
CONTRAST:  80mL OMNIPAQUE IOHEXOL 350 MG/ML SOLN

[Series 2: axial st · axial · 0.84mm/px · z∈[-508,-8]mm · 12 of 116 slices shown, 14 images]
[im 8/116  soft-tissue]
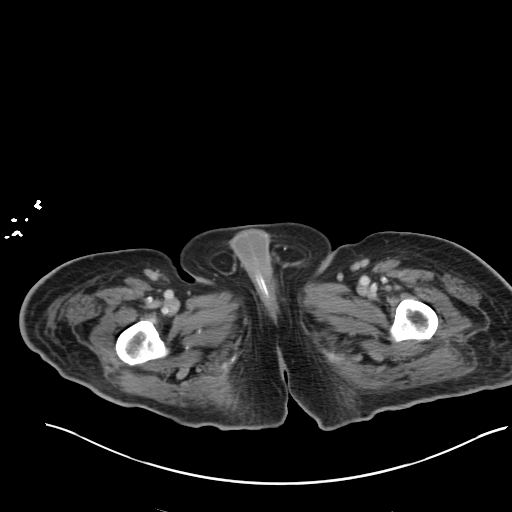
[im 8/116  bone]
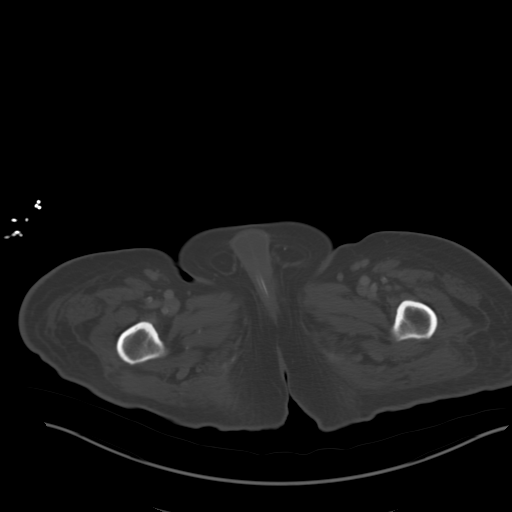
[im 15/116  soft-tissue]
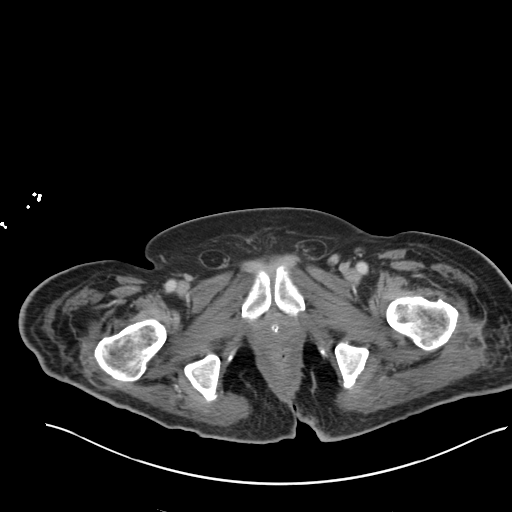
[im 29/116  soft-tissue]
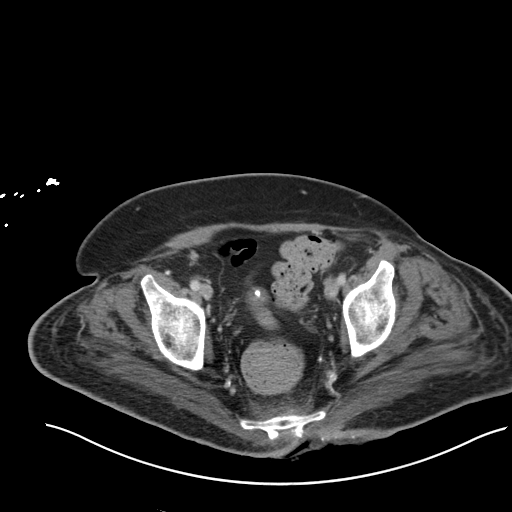
[im 36/116  soft-tissue]
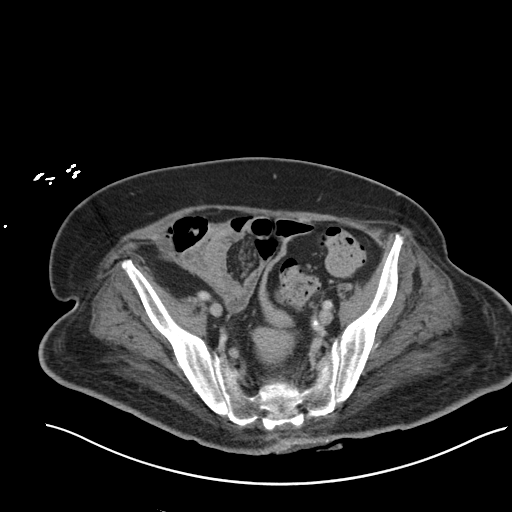
[im 44/116  soft-tissue]
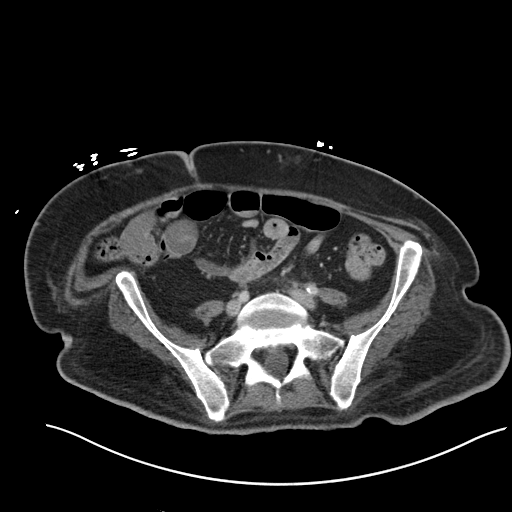
[im 51/116  soft-tissue]
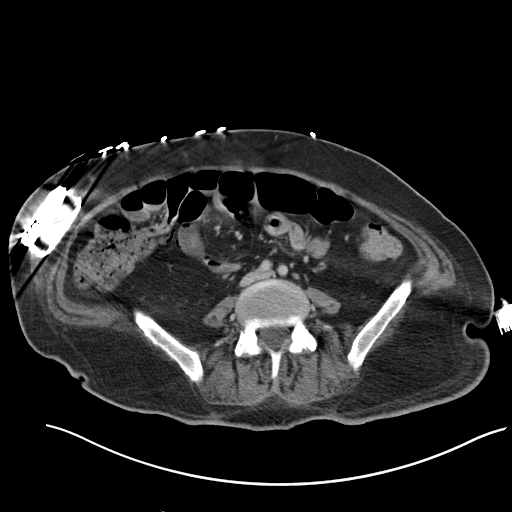
[im 65/116  soft-tissue]
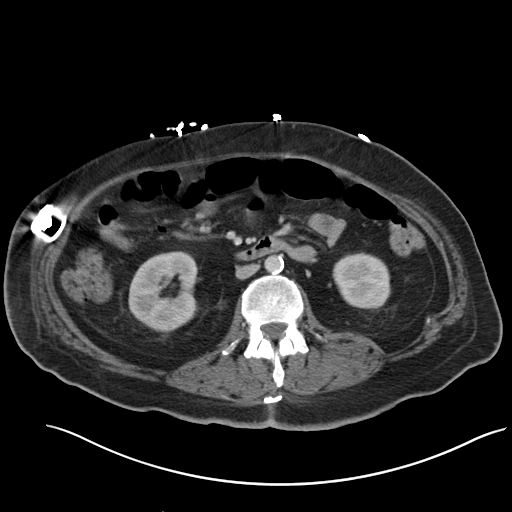
[im 72/116  soft-tissue]
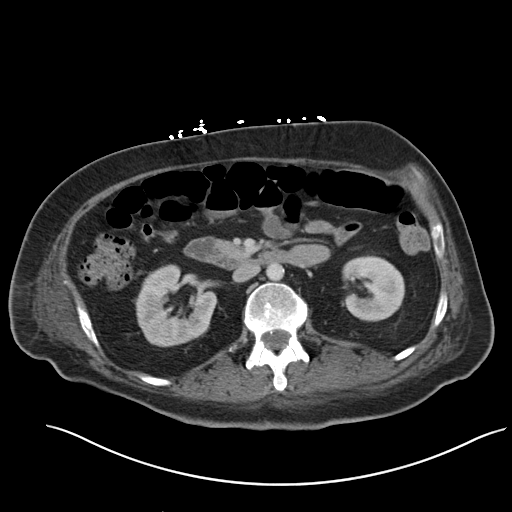
[im 80/116  soft-tissue]
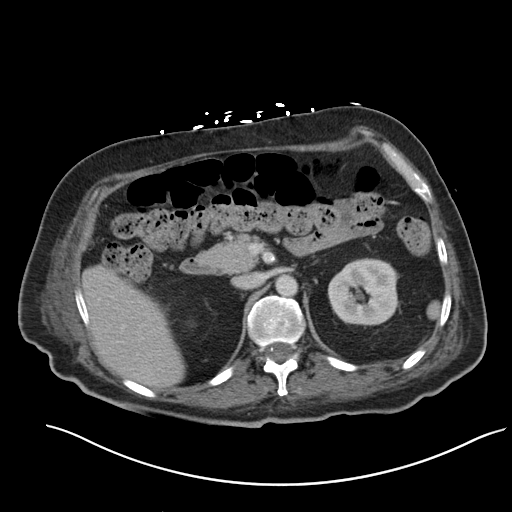
[im 80/116  bone]
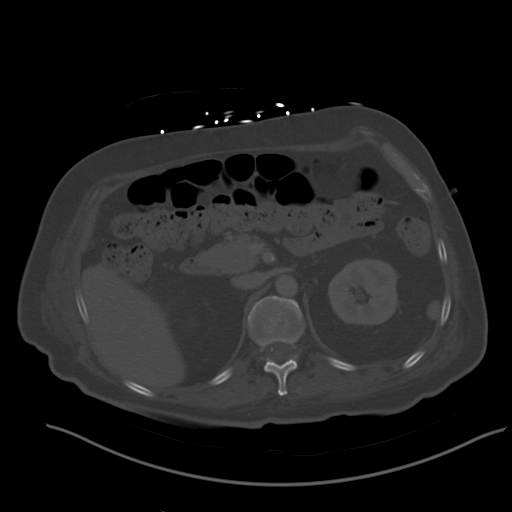
[im 87/116  soft-tissue]
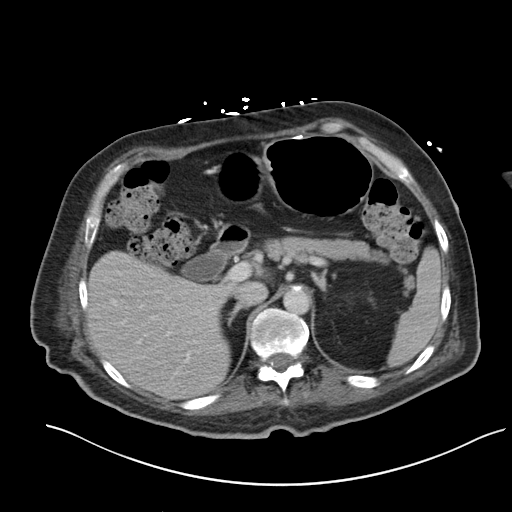
[im 101/116  soft-tissue]
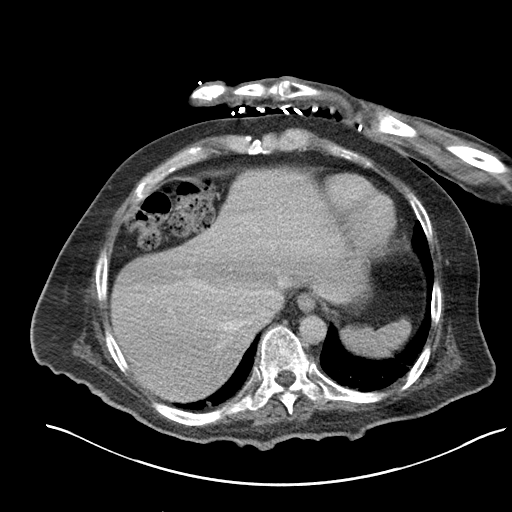
[im 108/116  soft-tissue]
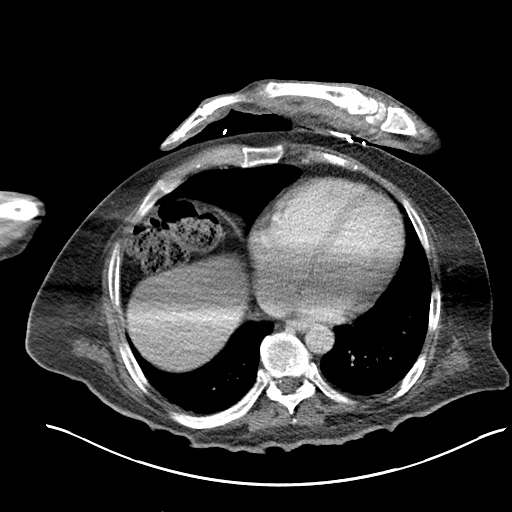

[Series 4: coronal st · coronal · 0.85mm/px · 3 of 162 slices shown]
[im 54/162  soft-tissue]
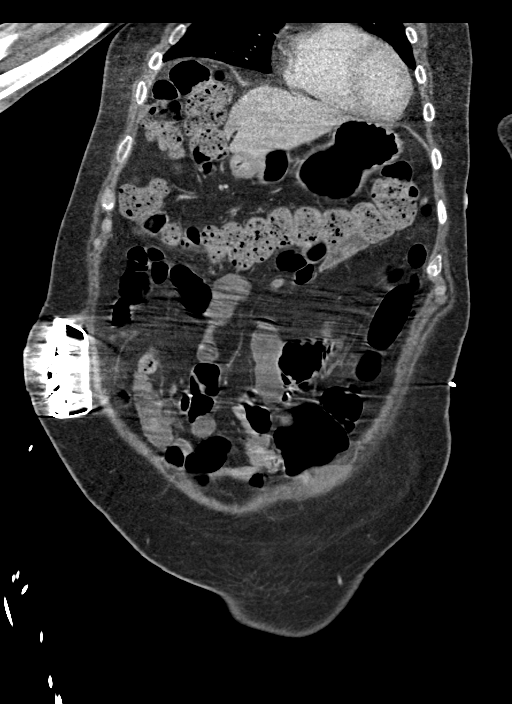
[im 72/162  soft-tissue]
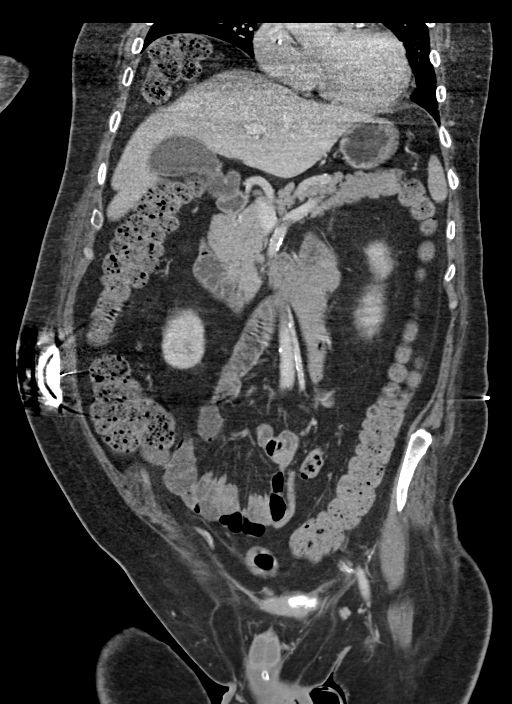
[im 90/162  soft-tissue]
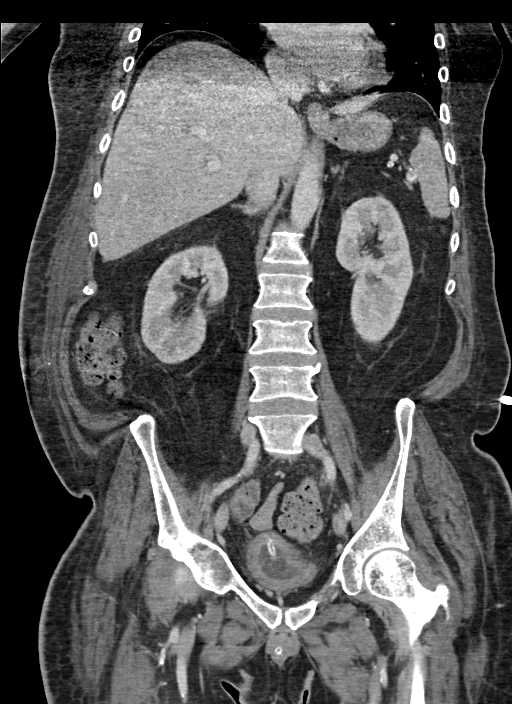

[15 of 46 positions shown; findings below may reference images not displayed]

FINDINGS: Lower chest: Lung bases demonstrate no acute consolidation or
effusion. Elevation of the right diaphragm with colonic inter
positioning. Normal cardiac size

Hepatobiliary: No focal liver abnormality is seen. No gallstones,
gallbladder wall thickening, or biliary dilatation.

Pancreas: Unremarkable. No pancreatic ductal dilatation or
surrounding inflammatory changes.

Spleen: Normal in size without focal abnormality.

Adrenals/Urinary Tract: Adrenal glands are normal. Kidneys show no
hydronephrosis. Catheter and air in the bladder. The bladder is
slightly thick walled.

Stomach/Bowel: The stomach is nonenlarged. No dilated small bowel.
No acute bowel wall thickening. Increased stool burden. Negative
appendix.

Vascular/Lymphatic: Mild aortic atherosclerosis. No aneurysm. No
suspicious nodes.

Reproductive: Prostate is unremarkable.

Other: Negative for pelvic effusion or free air. Small fat
containing inguinal hernias

Musculoskeletal: Deep left paramedian sacral decubitus ulcer extends
to the sacrococcygeal bone. Presacral soft tissue stranding and
edema. Small gas collection in addition to erosive changes and soft
tissue thickening at the sacrococcygeal region consistent with
osteomyelitis. Suspect that there is displaced pathologic fracture
through the coccyx. No drainable abscess.

Suspicion of bilateral hip effusions.
IMPRESSION: 1. Deep left paramedian sacral decubitus ulcer, extends down to the
sacrococcygeal bone. There are findings consistent with
sacrococcygeal osteomyelitis and suspect that there is a pathologic
fracture involving the coccyx. No drainable soft tissue abscess at
this time.
2. Nonspecific bilateral hip effusions.
3. No CT evidence for acute intra-abdominal or pelvic abnormality
4. Thick-walled urinary bladder which is decompressed by Foley
catheter
